# Patient Record
Sex: Female | Born: 1970 | Race: Black or African American | Hispanic: No | Marital: Single | Smoking: Never smoker
Health system: Southern US, Community
[De-identification: ages and names within clinical notes are randomized; demographics above are authoritative.]

## PROBLEM LIST (undated history)

## (undated) DIAGNOSIS — R0600 Dyspnea, unspecified: Secondary | ICD-10-CM

## (undated) DIAGNOSIS — K5900 Constipation, unspecified: Secondary | ICD-10-CM

## (undated) DIAGNOSIS — I10 Essential (primary) hypertension: Secondary | ICD-10-CM

## (undated) DIAGNOSIS — F419 Anxiety disorder, unspecified: Secondary | ICD-10-CM

## (undated) DIAGNOSIS — F329 Major depressive disorder, single episode, unspecified: Secondary | ICD-10-CM

## (undated) DIAGNOSIS — N939 Abnormal uterine and vaginal bleeding, unspecified: Secondary | ICD-10-CM

## (undated) DIAGNOSIS — E039 Hypothyroidism, unspecified: Secondary | ICD-10-CM

## (undated) DIAGNOSIS — F32A Depression, unspecified: Secondary | ICD-10-CM

## (undated) DIAGNOSIS — E739 Lactose intolerance, unspecified: Secondary | ICD-10-CM

## (undated) DIAGNOSIS — R109 Unspecified abdominal pain: Secondary | ICD-10-CM

## (undated) DIAGNOSIS — K219 Gastro-esophageal reflux disease without esophagitis: Secondary | ICD-10-CM

## (undated) DIAGNOSIS — M549 Dorsalgia, unspecified: Secondary | ICD-10-CM

## (undated) HISTORY — DX: Gastro-esophageal reflux disease without esophagitis: K21.9

## (undated) HISTORY — DX: Hypothyroidism, unspecified: E03.9

## (undated) HISTORY — DX: Unspecified abdominal pain: R10.9

## (undated) HISTORY — DX: Depression, unspecified: F32.A

## (undated) HISTORY — PX: CERCLAGE LAPAROSCOPIC ABDOMINAL: SHX5769

## (undated) HISTORY — DX: Dyspnea, unspecified: R06.00

## (undated) HISTORY — DX: Anxiety disorder, unspecified: F41.9

## (undated) HISTORY — DX: Lactose intolerance, unspecified: E73.9

## (undated) HISTORY — DX: Abnormal uterine and vaginal bleeding, unspecified: N93.9

## (undated) HISTORY — DX: Dorsalgia, unspecified: M54.9

## (undated) HISTORY — DX: Major depressive disorder, single episode, unspecified: F32.9

## (undated) HISTORY — DX: Essential (primary) hypertension: I10

## (undated) HISTORY — DX: Constipation, unspecified: K59.00

---

## 2002-03-26 ENCOUNTER — Ambulatory Visit (HOSPITAL_COMMUNITY): Admission: RE | Admit: 2002-03-26 | Discharge: 2002-03-26 | Payer: Self-pay | Admitting: Obstetrics & Gynecology

## 2002-03-26 ENCOUNTER — Encounter: Payer: Self-pay | Admitting: Obstetrics & Gynecology

## 2002-04-04 ENCOUNTER — Ambulatory Visit (HOSPITAL_COMMUNITY): Admission: RE | Admit: 2002-04-04 | Discharge: 2002-04-04 | Payer: Self-pay | Admitting: Obstetrics & Gynecology

## 2002-06-06 ENCOUNTER — Encounter: Admission: RE | Admit: 2002-06-06 | Discharge: 2002-06-14 | Payer: Self-pay | Admitting: Obstetrics & Gynecology

## 2002-06-20 ENCOUNTER — Inpatient Hospital Stay (HOSPITAL_COMMUNITY): Admission: AD | Admit: 2002-06-20 | Discharge: 2002-06-22 | Payer: Self-pay | Admitting: Obstetrics & Gynecology

## 2002-06-20 ENCOUNTER — Encounter: Payer: Self-pay | Admitting: Obstetrics & Gynecology

## 2002-08-09 ENCOUNTER — Inpatient Hospital Stay (HOSPITAL_COMMUNITY): Admission: AD | Admit: 2002-08-09 | Discharge: 2002-08-09 | Payer: Self-pay | Admitting: Obstetrics & Gynecology

## 2002-08-31 ENCOUNTER — Inpatient Hospital Stay (HOSPITAL_COMMUNITY): Admission: AD | Admit: 2002-08-31 | Discharge: 2002-09-02 | Payer: Self-pay | Admitting: Obstetrics

## 2004-02-06 ENCOUNTER — Inpatient Hospital Stay (HOSPITAL_COMMUNITY): Admission: AD | Admit: 2004-02-06 | Discharge: 2004-02-06 | Payer: Self-pay | Admitting: Obstetrics

## 2004-02-16 ENCOUNTER — Inpatient Hospital Stay (HOSPITAL_COMMUNITY): Admission: AD | Admit: 2004-02-16 | Discharge: 2004-02-21 | Payer: Self-pay | Admitting: Obstetrics & Gynecology

## 2005-08-26 ENCOUNTER — Encounter: Admission: RE | Admit: 2005-08-26 | Discharge: 2005-08-26 | Payer: Self-pay | Admitting: *Deleted

## 2010-02-07 ENCOUNTER — Encounter: Payer: Self-pay | Admitting: Obstetrics and Gynecology

## 2010-11-01 ENCOUNTER — Other Ambulatory Visit: Payer: Self-pay | Admitting: Certified Nurse Midwife

## 2010-11-01 DIAGNOSIS — Z1231 Encounter for screening mammogram for malignant neoplasm of breast: Secondary | ICD-10-CM

## 2010-12-06 ENCOUNTER — Ambulatory Visit: Payer: Self-pay

## 2011-01-13 ENCOUNTER — Ambulatory Visit
Admission: RE | Admit: 2011-01-13 | Discharge: 2011-01-13 | Disposition: A | Discharge status: Home / Self Care | Payer: BC Managed Care – PPO | Source: Ambulatory Visit | Attending: Certified Nurse Midwife | Admitting: Certified Nurse Midwife

## 2011-01-13 DIAGNOSIS — Z1231 Encounter for screening mammogram for malignant neoplasm of breast: Secondary | ICD-10-CM

## 2011-01-21 ENCOUNTER — Other Ambulatory Visit: Payer: Self-pay | Admitting: Certified Nurse Midwife

## 2011-01-21 DIAGNOSIS — R928 Other abnormal and inconclusive findings on diagnostic imaging of breast: Secondary | ICD-10-CM

## 2011-02-11 ENCOUNTER — Other Ambulatory Visit: Payer: BC Managed Care – PPO

## 2011-02-16 ENCOUNTER — Ambulatory Visit
Admission: RE | Admit: 2011-02-16 | Discharge: 2011-02-16 | Disposition: A | Discharge status: Home / Self Care | Payer: BC Managed Care – PPO | Source: Ambulatory Visit | Attending: Certified Nurse Midwife | Admitting: Certified Nurse Midwife

## 2011-02-16 DIAGNOSIS — R928 Other abnormal and inconclusive findings on diagnostic imaging of breast: Secondary | ICD-10-CM

## 2013-02-04 ENCOUNTER — Other Ambulatory Visit: Payer: Self-pay

## 2013-02-04 DIAGNOSIS — Z1231 Encounter for screening mammogram for malignant neoplasm of breast: Secondary | ICD-10-CM

## 2013-02-27 ENCOUNTER — Other Ambulatory Visit: Payer: Self-pay | Admitting: Internal Medicine

## 2013-02-27 DIAGNOSIS — E049 Nontoxic goiter, unspecified: Secondary | ICD-10-CM

## 2013-03-01 ENCOUNTER — Other Ambulatory Visit: Payer: Self-pay | Admitting: Internal Medicine

## 2013-03-01 ENCOUNTER — Ambulatory Visit
Admission: RE | Admit: 2013-03-01 | Discharge: 2013-03-01 | Disposition: A | Discharge status: Home / Self Care | Payer: BC Managed Care – PPO | Source: Ambulatory Visit | Attending: Internal Medicine | Admitting: Internal Medicine

## 2013-03-01 DIAGNOSIS — M79673 Pain in unspecified foot: Secondary | ICD-10-CM

## 2013-03-01 DIAGNOSIS — E049 Nontoxic goiter, unspecified: Secondary | ICD-10-CM

## 2013-03-11 ENCOUNTER — Ambulatory Visit: Payer: BC Managed Care – PPO

## 2013-03-25 ENCOUNTER — Ambulatory Visit
Admission: RE | Admit: 2013-03-25 | Discharge: 2013-03-25 | Disposition: A | Discharge status: Home / Self Care | Payer: BC Managed Care – PPO | Source: Ambulatory Visit

## 2013-03-25 DIAGNOSIS — Z1231 Encounter for screening mammogram for malignant neoplasm of breast: Secondary | ICD-10-CM

## 2014-04-03 ENCOUNTER — Other Ambulatory Visit: Payer: Self-pay | Admitting: Certified Nurse Midwife

## 2014-04-03 DIAGNOSIS — R928 Other abnormal and inconclusive findings on diagnostic imaging of breast: Secondary | ICD-10-CM

## 2014-04-08 ENCOUNTER — Ambulatory Visit
Admission: RE | Admit: 2014-04-08 | Discharge: 2014-04-08 | Disposition: A | Discharge status: Home / Self Care | Payer: BC Managed Care – PPO | Source: Ambulatory Visit | Attending: Certified Nurse Midwife | Admitting: Certified Nurse Midwife

## 2014-04-08 DIAGNOSIS — R928 Other abnormal and inconclusive findings on diagnostic imaging of breast: Secondary | ICD-10-CM

## 2014-07-07 IMAGING — US US SOFT TISSUE HEAD/NECK
1 series · 14 of 25 positions shown · non-contrast
Comparison: None.

CLINICAL DATA: Thyroid goiter.  History of hypothyroidism.

EXAM:
THYROID ULTRASOUND
TECHNIQUE: Ultrasound examination of the thyroid gland and adjacent soft
tissues was performed.

[Series 1: us soft tissue head/neck · 0.09mm/px · 14 of 40 slices shown]
[im 1/40]
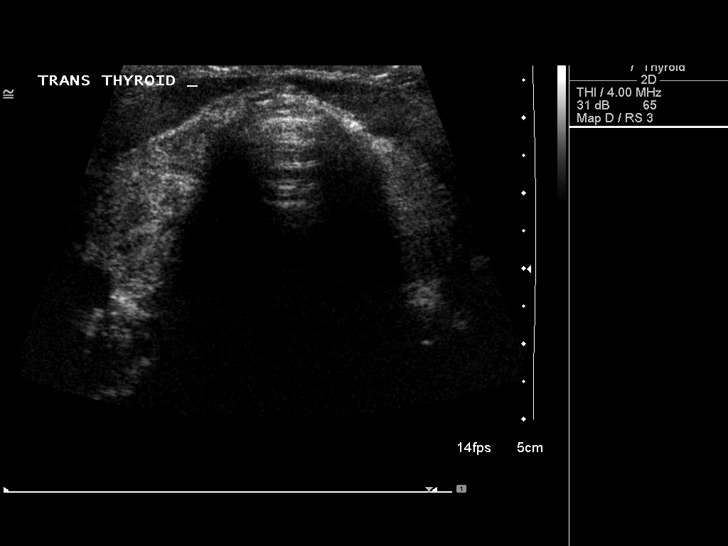
[im 4/40]
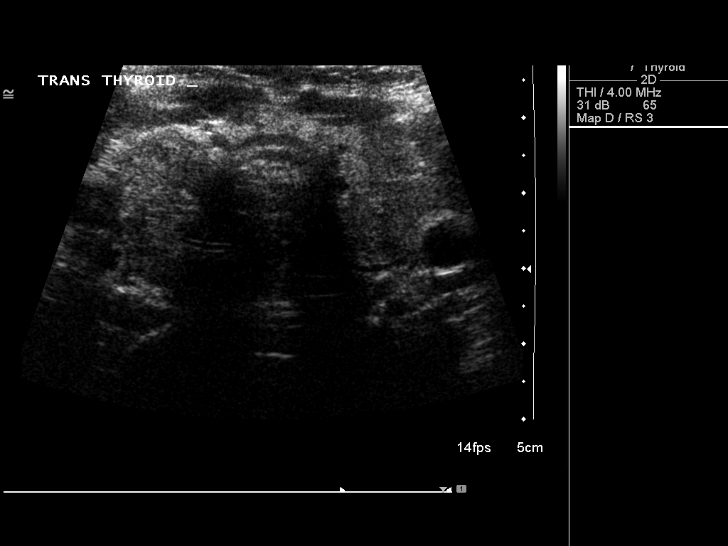
[im 7/40]
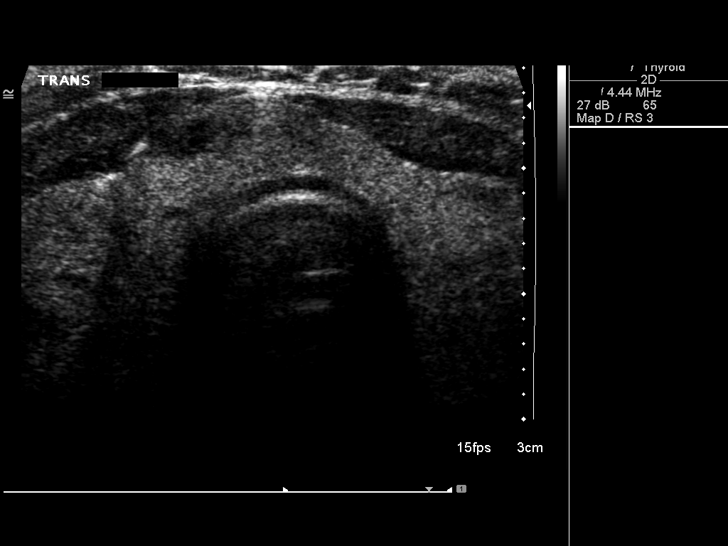
[im 10/40]
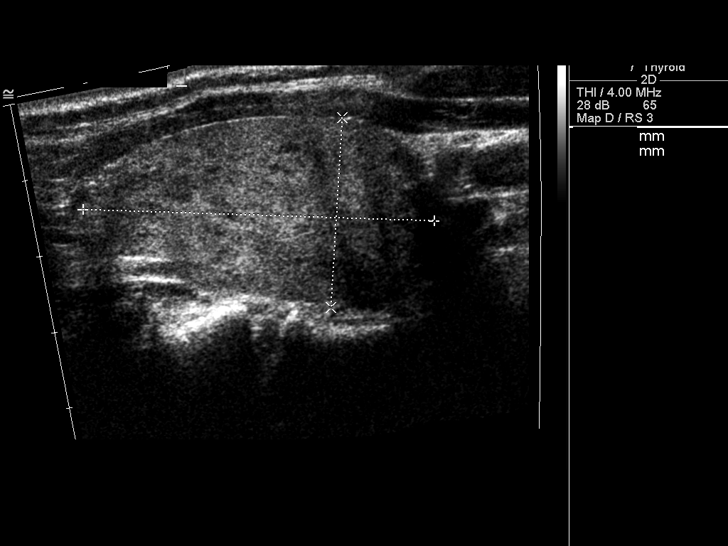
[im 14/40]
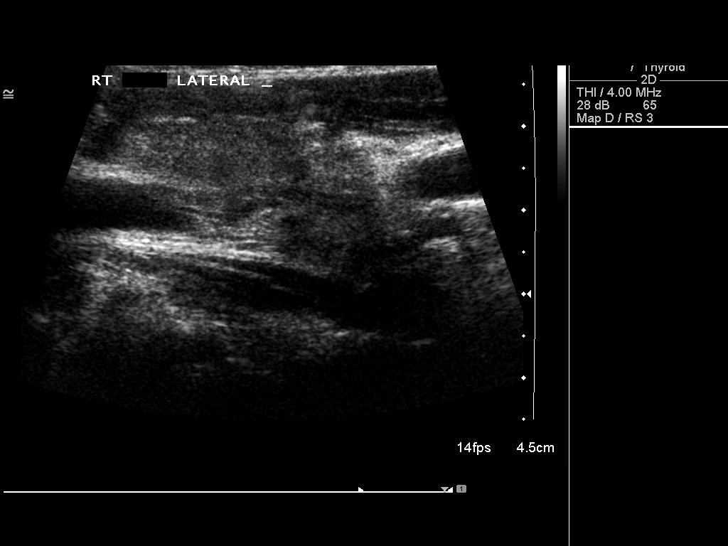
[im 15/40]
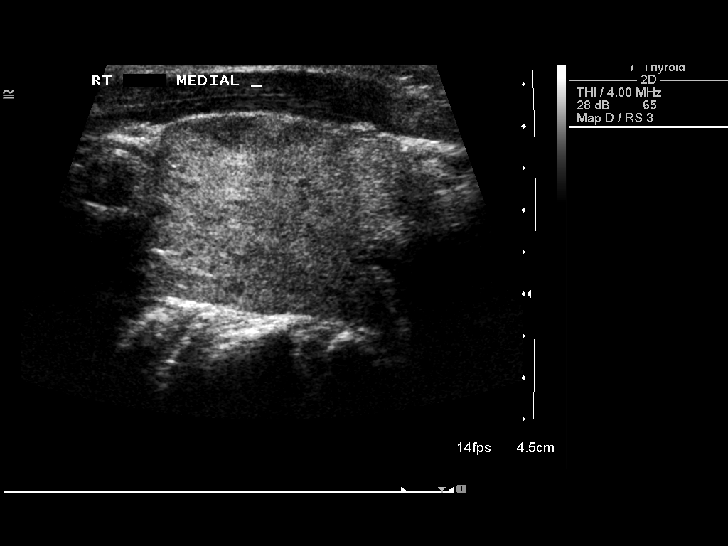
[im 18/40]
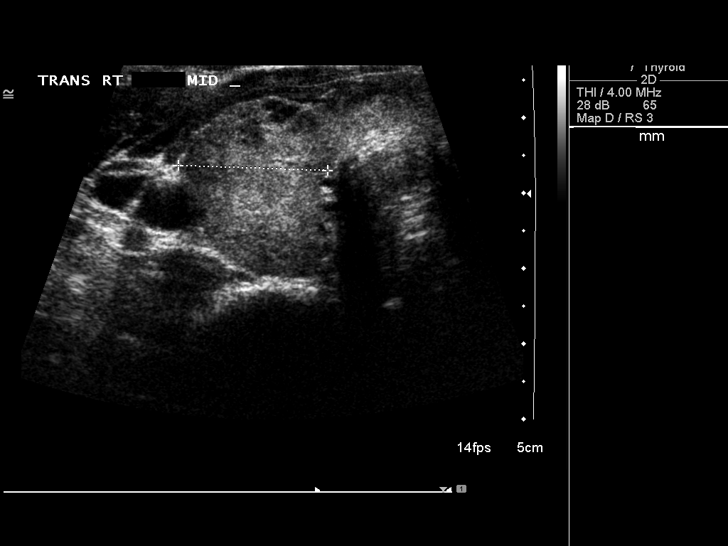
[im 22/40]
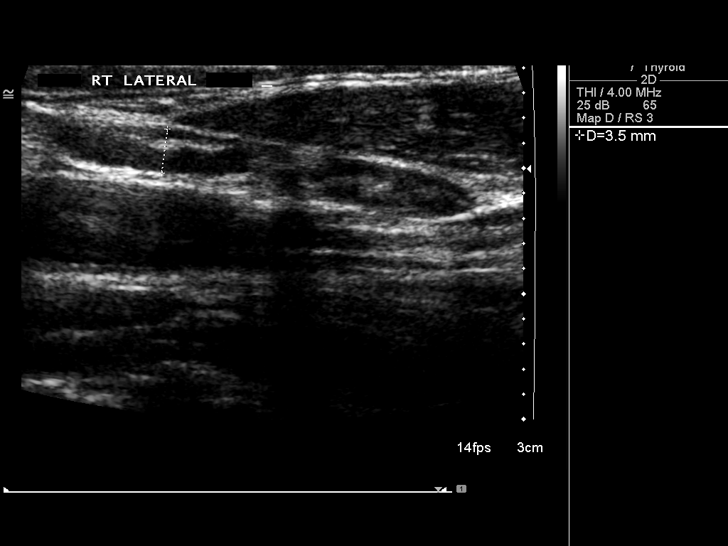
[im 25/40]
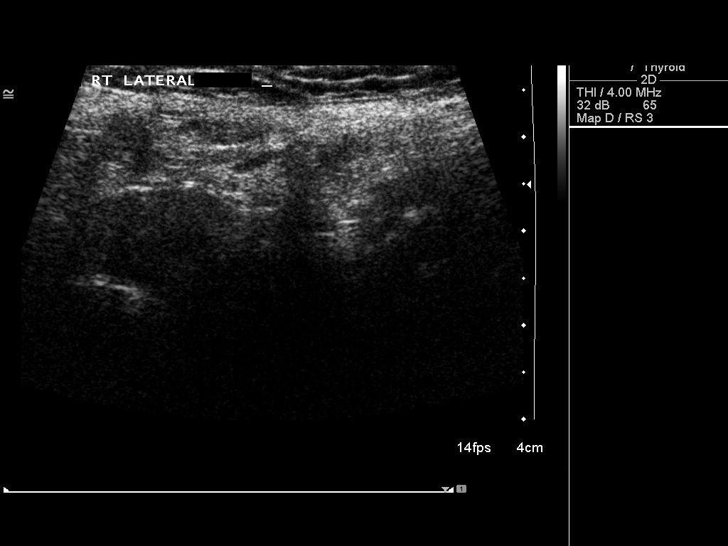
[im 27/40]
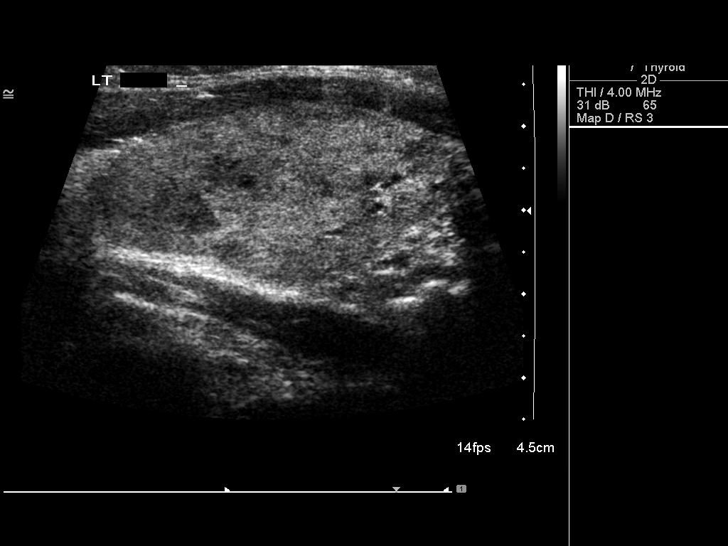
[im 30/40]
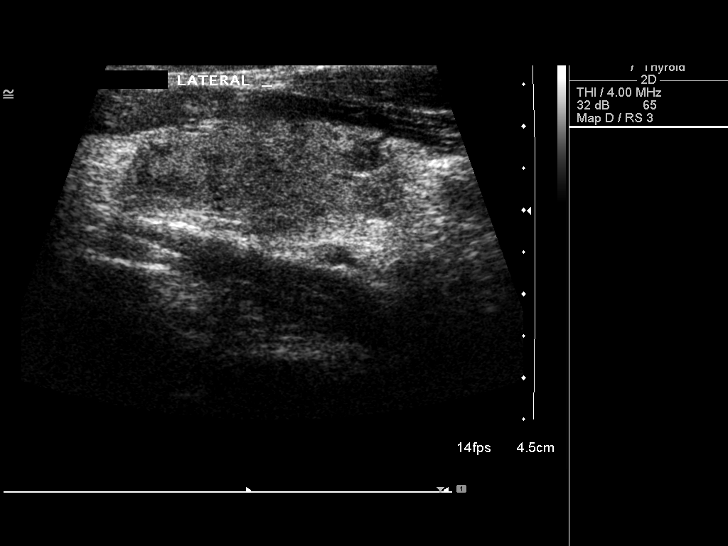
[im 33/40]
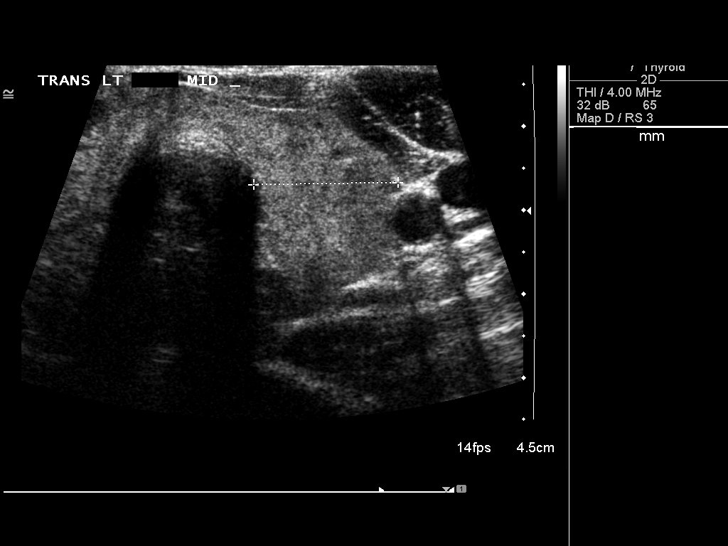
[im 36/40]
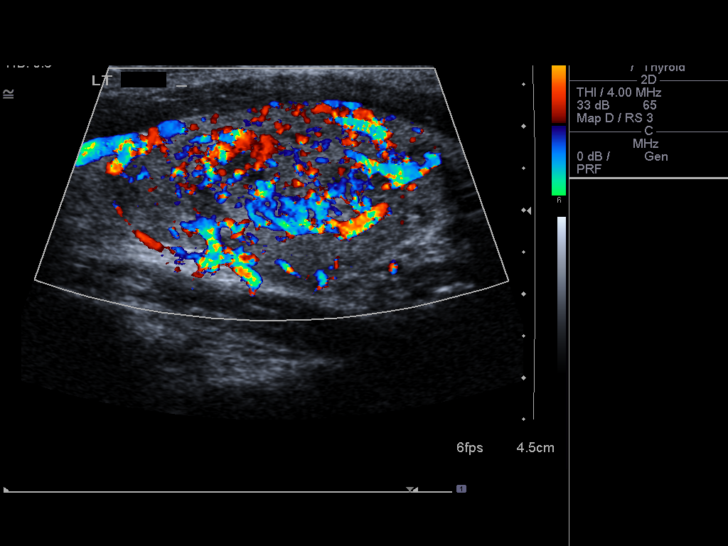
[im 40/40]
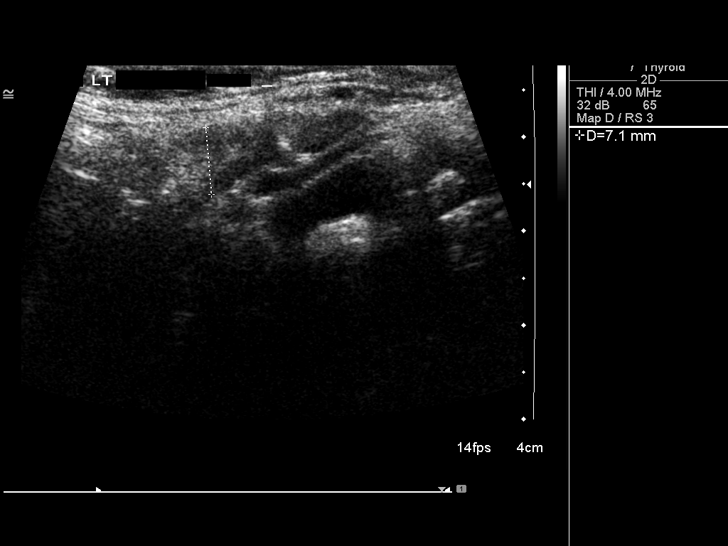

[14 of 25 positions shown; findings below may reference images not displayed]

FINDINGS: Right thyroid lobe

Measurements: 4.6 x 2.5 x 2.0 cm.  No nodules visualized.

Left thyroid lobe

Measurements: 4.4 x 2.2 x 1.7 cm.  No nodules visualized.

Isthmus

Thickness: 0.6 cm.  No nodules visualized.

The thyroid gland is heterogeneous in echotexture. No discrete
nodules are identified. Color Doppler shows diffusely increased
vascularity throughout the thyroid parenchyma which can be
associated with thyroiditis.

Lymphadenopathy

None visualized.
IMPRESSION: Normal sized thyroid gland which is heterogeneous and shows
increased vascularity. Increased vascularity can be associated with
active thyroiditis. No thyroid nodules are identified.

## 2014-07-07 IMAGING — CR DG OS CALCIS 2+V*L*
1 series · 1 of 1 positions shown · non-contrast
Comparison: None.

CLINICAL DATA: Heel pain.  No injury.

EXAM:
LEFT OS CALCIS - 2+ VIEW

[view not recorded]
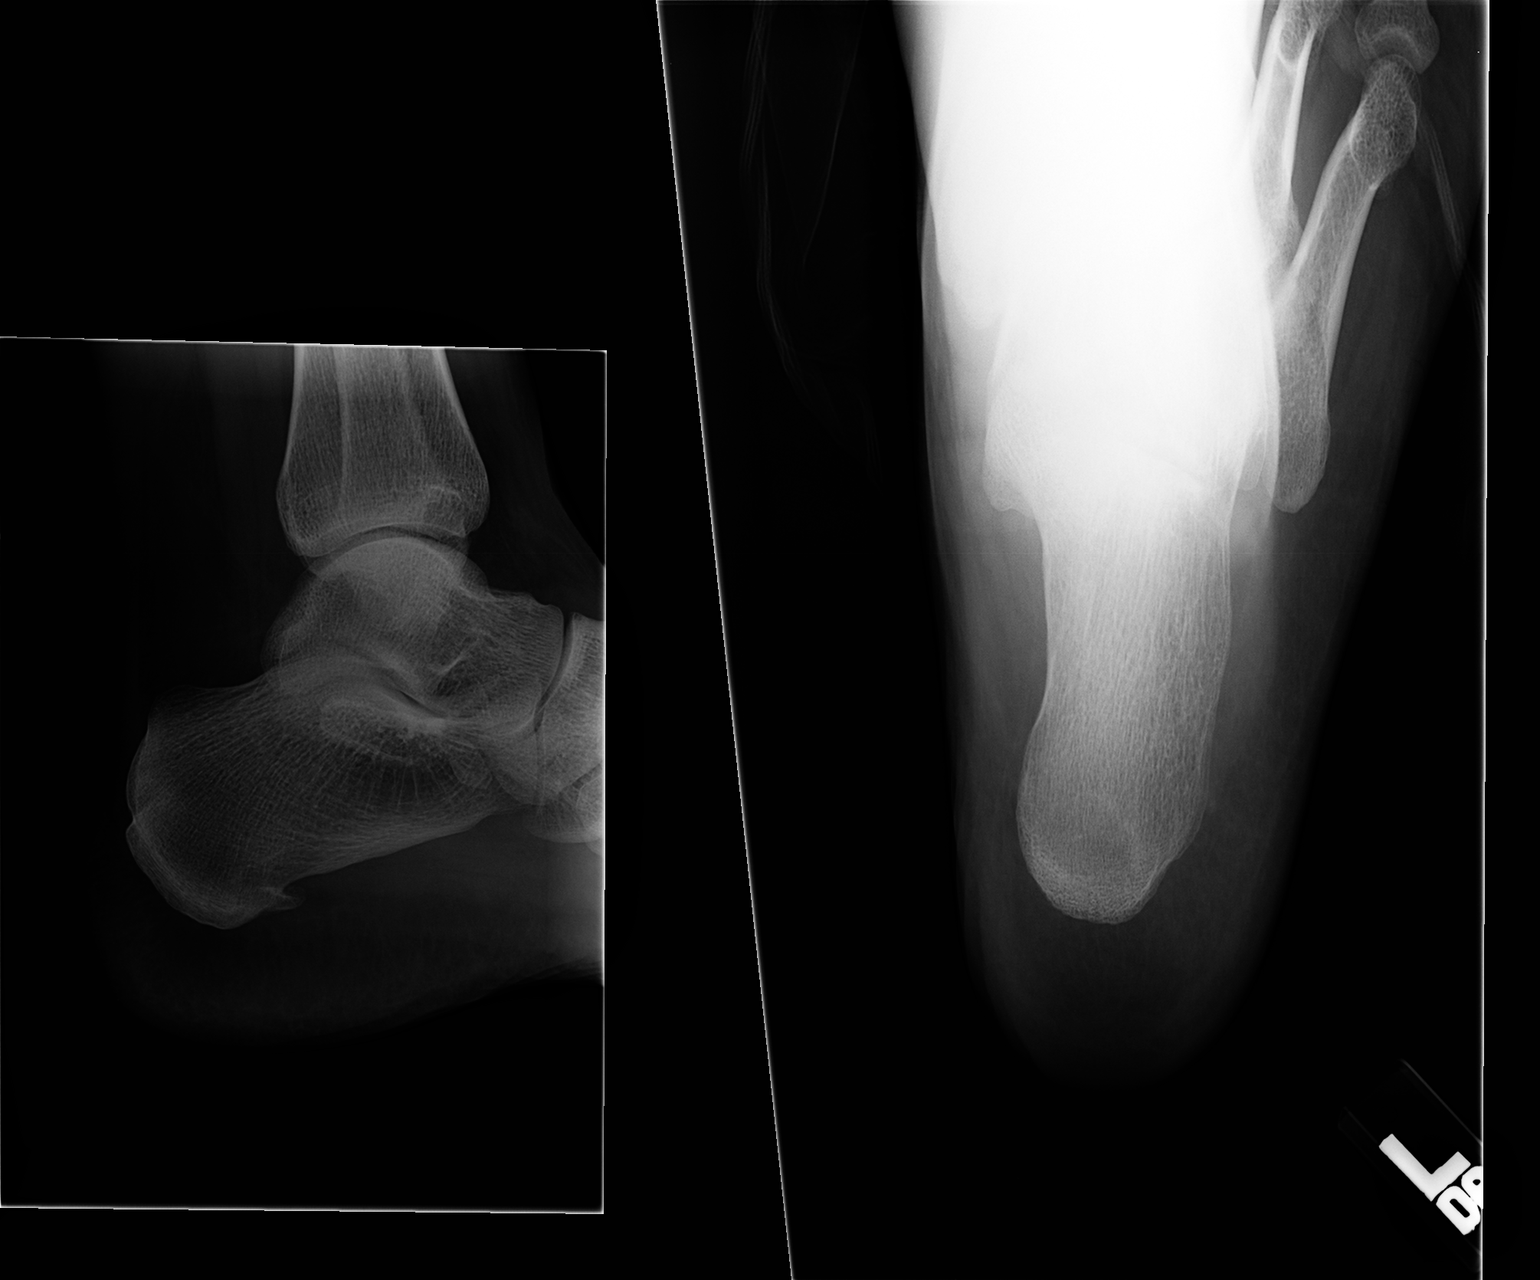

[1 of 1 positions shown; findings below may reference images not displayed]

FINDINGS: There is a small plantar calcaneal spur. No acute bony abnormality.
Specifically, no fracture, subluxation, or dislocation. Soft tissues
are intact.
IMPRESSION: Small plantar calcaneal spur.  No acute bony abnormality.

## 2015-04-24 ENCOUNTER — Other Ambulatory Visit: Payer: Self-pay

## 2015-04-24 DIAGNOSIS — Z1231 Encounter for screening mammogram for malignant neoplasm of breast: Secondary | ICD-10-CM

## 2015-04-28 ENCOUNTER — Ambulatory Visit
Admission: RE | Admit: 2015-04-28 | Discharge: 2015-04-28 | Disposition: A | Discharge status: Home / Self Care | Payer: BC Managed Care – PPO | Source: Ambulatory Visit

## 2015-04-28 DIAGNOSIS — Z1231 Encounter for screening mammogram for malignant neoplasm of breast: Secondary | ICD-10-CM

## 2016-11-09 ENCOUNTER — Other Ambulatory Visit: Payer: Self-pay | Admitting: Certified Nurse Midwife

## 2016-11-09 DIAGNOSIS — R928 Other abnormal and inconclusive findings on diagnostic imaging of breast: Secondary | ICD-10-CM

## 2016-11-14 ENCOUNTER — Ambulatory Visit
Admission: RE | Admit: 2016-11-14 | Discharge: 2016-11-14 | Disposition: A | Discharge status: Home / Self Care | Payer: BC Managed Care – PPO | Source: Ambulatory Visit | Attending: Certified Nurse Midwife | Admitting: Certified Nurse Midwife

## 2016-11-14 DIAGNOSIS — R928 Other abnormal and inconclusive findings on diagnostic imaging of breast: Secondary | ICD-10-CM

## 2018-02-15 ENCOUNTER — Encounter (INDEPENDENT_AMBULATORY_CARE_PROVIDER_SITE_OTHER): Payer: Self-pay

## 2018-02-20 ENCOUNTER — Encounter (INDEPENDENT_AMBULATORY_CARE_PROVIDER_SITE_OTHER): Payer: Self-pay

## 2018-03-07 ENCOUNTER — Encounter (INDEPENDENT_AMBULATORY_CARE_PROVIDER_SITE_OTHER): Payer: Self-pay | Admitting: Family Medicine

## 2018-03-07 ENCOUNTER — Ambulatory Visit (INDEPENDENT_AMBULATORY_CARE_PROVIDER_SITE_OTHER): Payer: BC Managed Care – PPO | Admitting: Family Medicine

## 2018-03-07 VITALS — BP 122/83 | HR 82 | Temp 98.4°F | Ht 68.0 in | Wt 220.0 lb

## 2018-03-07 DIAGNOSIS — E669 Obesity, unspecified: Secondary | ICD-10-CM

## 2018-03-07 DIAGNOSIS — Z6833 Body mass index (BMI) 33.0-33.9, adult: Secondary | ICD-10-CM

## 2018-03-07 DIAGNOSIS — Z0289 Encounter for other administrative examinations: Secondary | ICD-10-CM

## 2018-03-07 DIAGNOSIS — E038 Other specified hypothyroidism: Secondary | ICD-10-CM | POA: Diagnosis not present

## 2018-03-07 DIAGNOSIS — R5383 Other fatigue: Secondary | ICD-10-CM

## 2018-03-07 DIAGNOSIS — Z9189 Other specified personal risk factors, not elsewhere classified: Secondary | ICD-10-CM | POA: Diagnosis not present

## 2018-03-07 DIAGNOSIS — Z1331 Encounter for screening for depression: Secondary | ICD-10-CM

## 2018-03-07 DIAGNOSIS — I10 Essential (primary) hypertension: Secondary | ICD-10-CM | POA: Diagnosis not present

## 2018-03-07 DIAGNOSIS — R0602 Shortness of breath: Secondary | ICD-10-CM

## 2018-03-07 NOTE — Progress Notes (Signed)
Office: [phone]  /  Fax: [phone]   Dear Dr. Cherly Hensenousins,   Thank you for referring Maureen [name] to our clinic. The following note includes my evaluation and treatment recommendations.  HPI:   Chief Complaint: OBESITY    Maureen [name] has been referred by Maureen BetterSheronette Cousins, MD for consultation regarding her obesity and obesity related comorbidities.    Maureen [name] (MR# [id number]) is a 48 y.o. female who presents on 03/07/2018 for obesity evaluation and treatment. Current BMI is Body mass index is 33.45 kg/m.Maureen [name] has been struggling with her weight for many years and has been unsuccessful in either losing weight, maintaining weight loss, or reaching her healthy weight goal.     Maureen [name] attended our information session and states she is currently in the action stage of change and ready to dedicate time achieving and maintaining a healthier weight. Maureen [name] is interested in becoming our patient and working on intensive lifestyle modifications including (but not limited to) diet, exercise and weight loss.    Maureen [name] states she started gaining weight when her marriage went bad. her heaviest weight ever was 220 lbs. she has food cravings at night while watching TV she snacks frequently in the evenings she wakes up frequently in the middle of the night to eat she skips breakfast and sometimes lunch she is trying to eat vegetarian she is frequently drinking liquids with calories (ginger ale everyday) she frequently makes poor food choices she frequently eats larger portions than normal  she struggles with emotional eating    Fatigue Maureen [name] feels her energy is lower than it should be. This has worsened with weight gain and has worsened recently. Maureen [name] admits to daytime somnolence and  admits to waking up still tired. Patient is at risk for obstructive sleep apnea. Maureen [name] has a history of symptoms of daytime fatigue, morning fatigue and morning headache. Patient generally gets 4  hours of sleep per night, and states they generally do not sleep well most nights. Snoring is not present. Apneic episodes are not present. Epworth Sleepiness Score is 9.  Dyspnea on exertion Maureen [name] notes increasing shortness of breath with exercising and seems to be worsening over time with weight gain. She notes getting out of breath sooner with activity than she used to. This has gotten worse recently. Maureen [name] denies orthopnea.  Hypothyroid Maureen [name] has a diagnosis of hypothyroidism. She is on levothyroxine. She denies hot or cold intolerance or palpitations.  Hypertension Maureen [name] is a 48 y.o. female with hypertension.  Maureen [name] denies chest pain or shortness of breath on exertion. She is working weight loss to help control her blood pressure with the goal of decreasing her risk of heart attack and stroke. Maureen [name]'s blood pressure is stable on medications. Her goal is to get healthier so she can discontinue medications.   At risk for cardiovascular disease Maureen [name] is at a higher than average risk for cardiovascular disease due to obesity. She currently denies any chest pain.  Depression Screen Maureen [name] Food and Mood (modified PHQ-9) score was  Depression screen PHQ 2/9 03/07/2018  Decreased Interest 2  Down, Depressed, Hopeless 2  PHQ - 2 Score 4  Altered sleeping 3  Tired, decreased energy 3  Change in appetite 3  Feeling bad or failure about yourself  1  Trouble concentrating 1  Moving slowly or fidgety/restless 0  Suicidal thoughts 0  PHQ-9 Score 15  Difficult doing work/chores Somewhat difficult   ASSESSMENT AND PLAN:  Other fatigue -  Plan: EKG 12-Lead, Vitamin B12, CBC With Differential, Comprehensive metabolic panel, Folate, Hemoglobin A1c, Insulin, random, Lipid Panel With LDL/HDL Ratio, VITAMIN D 25 Hydroxy (Vit-D Deficiency, Fractures)  Shortness of breath on exertion  Other specified hypothyroidism - Plan: T3, T4, free, TSH  Essential  hypertension  Depression screening  At risk for heart disease  Class 1 obesity with serious comorbidity and body mass index (BMI) of 33.0 to 33.9 in adult, unspecified obesity type  PLAN:  Fatigue Maureen [name] was informed that her fatigue may be related to obesity, depression or many other causes. Labs will be ordered, and in the meanwhile Maureen [name] has agreed to work on diet, exercise and weight loss to help with fatigue. Proper sleep hygiene was discussed including the need for 7-8 hours of quality sleep each night. A sleep study was not ordered based on symptoms and Epworth score.  Dyspnea on exertion Maureen [name]'s shortness of breath appears to be obesity related and exercise induced. She has agreed to work on weight loss and gradually increase exercise to treat her exercise induced shortness of breath. If Maureen [name] follows our instructions and loses weight without improvement of her shortness of breath, we will plan to refer to pulmonology. We will monitor this condition regularly. Maureen [name] to this plan.  Hypothyroid Maureen [name] was informed of the importance of good thyroid control to help with weight loss efforts. She was also informed that supertherapeutic thyroid levels are dangerous and will not improve weight loss results. We will check labs today. She will continue on levothyroxine and [name] to follow-up with our clinic in 2 weeks.  Hypertension We discussed sodium restriction, working on healthy weight loss, and a regular exercise program as the means to achieve improved blood pressure control. Maureen [name] agreed with this plan and agreed to follow up as directed. We will continue to monitor her blood pressure as well as her progress with the above lifestyle modifications. She will continue her medications as prescribed and will watch for signs of hypotension as she continues her lifestyle modifications. We will check labs today.  Cardiovascular risk counseling Maureen [name] was given extended (15 minutes)  coronary artery disease prevention counseling today. She is 48 y.o. female and has risk factors for heart disease including obesity. We discussed intensive lifestyle modifications today with an emphasis on specific weight loss instructions and strategies. Pt was also informed of the importance of increasing exercise and decreasing saturated fats to help prevent heart disease.  Depression Screen Maureen [name] had a strongly positive depression screening. Depression is commonly associated with obesity and often results in emotional eating behaviors. We will monitor this closely and work on CBT to help improve the non-hunger eating patterns. Referral to Psychology may be required if no improvement is seen as she continues in our clinic.  Obesity Keigan is currently in the action stage of change and her goal is to continue with weight loss efforts. I recommend Eulalee begin the structured treatment plan as follows:  She has agreed to follow the Category 2 plan + 100 calories. Chonda dislikes yogurt and will eat 2 butterball [place] sausage patties. Sheenamarie has been instructed to eventually work up to a goal of 150 minutes of combined cardio and strengthening exercise per week for weight loss and overall health benefits. We discussed the following Behavioral Modification Strategies today: increasing lean protein intake, decreasing simple carbohydrates, and work on meal planning and easy cooking plans.   She was informed of the importance of frequent follow-up visits to maximize her success with  intensive lifestyle modifications for her multiple health conditions. She was informed we would discuss her lab results at her next visit unless there is a critical issue that needs to be addressed sooner. Brionne agreed to keep her next visit at the agreed upon time to discuss these results.  ALLERGIES: Allergies  Allergen Reactions  . Lactose Intolerance (Gi)   . Vicodin [Hydrocodone-Acetaminophen] Nausea And Vomiting     MEDICATIONS: Current Outpatient Medications on File Prior to Visit  Medication Sig Dispense Refill  . amLODipine (NORVASC) 2.5 MG tablet Take 2.5 mg by mouth daily.    . cetirizine (ZYRTEC) 10 MG tablet Take 10 mg by mouth daily.    . cyclobenzaprine (FLEXERIL) 10 MG tablet Take 10 mg by mouth 3 (three) times daily as needed for muscle spasms.    . fluticasone (FLONASE) 50 MCG/ACT nasal spray Place 2 sprays into both nostrils daily.    Marland Kitchen ibuprofen (ADVIL,MOTRIN) 200 MG tablet Take 200 mg by mouth every 6 (six) hours as needed.    Marland Kitchen levothyroxine (SYNTHROID, LEVOTHROID) 100 MCG tablet Take 100 mcg by mouth daily before breakfast.    . loratadine (CLARITIN) 10 MG tablet Take 10 mg by mouth daily.    Marland Kitchen LORazepam (ATIVAN) 0.5 MG tablet Take 0.5 mg by mouth at bedtime.    . naproxen sodium (ALEVE) 220 MG tablet Take 220 mg by mouth daily as needed.    . norethindrone-ethinyl estradiol (JUNEL FE,GILDESS FE,LOESTRIN FE) 1-20 MG-MCG tablet Take 1 tablet by mouth daily.     No current facility-administered medications on file prior to visit.     PAST MEDICAL HISTORY: Past Medical History:  Diagnosis Date  . Abdominal pain   . Anxiety   . Back pain   . Constipation   . Depression   . Dyspnea   . GERD (gastroesophageal reflux disease)   . HTN (hypertension)   . Hypothyroidism   . Lactose intolerance   . Vaginal bleeding, abnormal     PAST SURGICAL HISTORY: Past Surgical History:  Procedure Laterality Date  . CERCLAGE LAPAROSCOPIC ABDOMINAL     [phone]    SOCIAL HISTORY: Social History   Tobacco Use  . Smoking status: Never Smoker  . Smokeless tobacco: Never Used  Substance Use Topics  . Alcohol use: Not on file  . Drug use: Not on file    FAMILY HISTORY: Family History  Problem Relation Age of Onset  . Hypertension Mother   . Thyroid disease Mother   . Depression Mother   . Anxiety disorder Mother    ROS: Review of Systems  Constitutional: Positive for  malaise/fatigue.       Negative for hot or cold intolerance. Positive for trouble sleeping.  HENT:       Positive for nose stuffiness.  Eyes:       Positive for vision changes. Positive for wearing glasses or contacts.  Cardiovascular: Negative for palpitations and orthopnea.  Musculoskeletal: Positive for back pain.       Positive for leg cramping.  Skin:       Positive for dry skin.  Neurological: Positive for headaches.  Psychiatric/Behavioral:       Positive for stress.   PHYSICAL EXAM: Blood pressure 122/83, pulse 82, temperature 98.4 F (36.9 C), temperature source Oral, height 5\' 8"  (1.727 m), weight 220 lb (99.8 kg), SpO2 99 %. Body mass index is 33.45 kg/m. Physical Exam Vitals signs reviewed.  Constitutional:      Appearance: Normal appearance. She is  well-developed. She is obese.  HENT:     Head: Normocephalic and atraumatic.     Nose: Nose normal.  Eyes:     General: No scleral icterus. Neck:     Musculoskeletal: Normal range of motion.  Cardiovascular:     Rate and Rhythm: Normal rate and regular rhythm.  Pulmonary:     Effort: Pulmonary effort is normal. No respiratory distress.  Abdominal:     Palpations: Abdomen is soft.     Tenderness: There is no abdominal tenderness.  Musculoskeletal: Normal range of motion.     Comments: Range of motion normal in all four extremities.  Skin:    General: Skin is warm and dry.  Neurological:     Mental Status: She is alert and oriented to person, place, and time.     Coordination: Coordination normal.  Psychiatric:        Mood and Affect: Mood and affect normal.        Behavior: Behavior normal.   RECENT LABS AND TESTS: BMET No results found for: NA, K, CL, CO2, GLUCOSE, BUN, CREATININE, CALCIUM, GFRNONAA, GFRAA No results found for: HGBA1C No results found for: INSULIN CBC No results found for: WBC, RBC, HGB, HCT, PLT, MCV, MCH, MCHC, RDW, LYMPHSABS, MONOABS, EOSABS, BASOSABS Iron/TIBC/Ferritin/ %Sat No  results found for: IRON, TIBC, FERRITIN, IRONPCTSAT Lipid Panel  No results found for: CHOL, TRIG, HDL, CHOLHDL, VLDL, LDLCALC, LDLDIRECT Hepatic Function Panel  No results found for: PROT, ALBUMIN, AST, ALT, ALKPHOS, BILITOT, BILIDIR, IBILI No results found for: TSH  ECG  Shows a rate of 76, sinus rhythm - poor R-wave progression - nonspecific - consider old anterior infarct. INDIRECT CALORIMETER done today shows a VO2 of 241 and a REE of 1675.  Her calculated basal metabolic rate is 1610 thus her basal metabolic rate is worse than expected.  OBESITY BEHAVIORAL INTERVENTION VISIT  Today's visit was #1   Starting weight: 220 lbs Starting date: 03/07/2018 Today's weight: 220 lbs  Today's date: 03/07/2018 Total lbs lost to date: 0    Most Recent Value 03/14/2013 - 03/13/2018  Height 5\' 8"  (1.727 m) 03/07/2018  Weight 220 lb (99.8 kg) 03/07/2018  BMI (Calculated) 33.46 03/07/2018  BLOOD PRESSURE - SYSTOLIC 122 03/07/2018  BLOOD PRESSURE - DIASTOLIC 83 03/07/2018  Waist Measurement  44 inches 03/07/2018   Body Fat % 39.7 % 03/07/2018  Total Body Water (lbs) 82.6 lbs 03/07/2018  RMR 1675 03/07/2018   ASK: We discussed the diagnosis of obesity with Maureen [name] today and Mariaha agreed to give [name] permission to discuss obesity behavioral modification therapy today.  ASSESS: Lynnette has the diagnosis of obesity and her BMI today is 33.45. Chanele is in the action stage of change.   ADVISE: Wilmina was educated on the multiple health risks of obesity as well as the benefit of weight loss to improve her health. She was advised of the need for long term treatment and the importance of lifestyle modifications to improve her current health and to decrease her risk of future health problems.  AGREE: Multiple dietary modification options and treatment options were discussed and  Daaiyah agreed to follow the recommendations documented in the above note.  ARRANGE: Brightyn was educated on the  importance of frequent visits to treat obesity as outlined per CMS and USPSTF guidelines and agreed to schedule her next follow up appointment today.  IMarianna Payment, am acting as Energy manager for Quillian Quince, MD  I have reviewed the above documentation for  accuracy and completeness, and I agree with the above. -Dennard Nip, MD

## 2018-03-08 LAB — COMPREHENSIVE METABOLIC PANEL
ALBUMIN: 4.3 g/dL (ref 3.8–4.8)
ALT: 10 IU/L (ref 0–32)
AST: 16 IU/L (ref 0–40)
Albumin/Globulin Ratio: 1.7 (ref 1.2–2.2)
Alkaline Phosphatase: 69 IU/L (ref 39–117)
BUN / CREAT RATIO: 12 (ref 9–23)
BUN: 11 mg/dL (ref 6–24)
Bilirubin Total: 0.3 mg/dL (ref 0.0–1.2)
CALCIUM: 9.3 mg/dL (ref 8.7–10.2)
CHLORIDE: 102 mmol/L (ref 96–106)
CO2: 21 mmol/L (ref 20–29)
CREATININE: 0.91 mg/dL (ref 0.57–1.00)
GFR calc non Af Amer: 74 mL/min/{1.73_m2} (ref >59)
GFR, EST AFRICAN AMERICAN: 85 mL/min/{1.73_m2} (ref >59)
GLUCOSE: 81 mg/dL (ref 65–99)
Globulin, Total: 2.5 g/dL (ref 1.5–4.5)
Potassium: 4.6 mmol/L (ref 3.5–5.2)
Sodium: 138 mmol/L (ref 134–144)
TOTAL PROTEIN: 6.8 g/dL (ref 6.0–8.5)

## 2018-03-08 LAB — CBC WITH DIFFERENTIAL
BASOS ABS: 0.1 10*3/uL (ref 0.0–0.2)
Basos: 1 %
EOS (ABSOLUTE): 0.2 10*3/uL (ref 0.0–0.4)
Eos: 3 %
HEMOGLOBIN: 13.4 g/dL (ref 11.1–15.9)
Hematocrit: 41 % (ref 34.0–46.6)
IMMATURE GRANS (ABS): 0 10*3/uL (ref 0.0–0.1)
IMMATURE GRANULOCYTES: 0 %
LYMPHS: 26 %
Lymphocytes Absolute: 2.2 10*3/uL (ref 0.7–3.1)
MCH: 27.6 pg (ref 26.6–33.0)
MCHC: 32.7 g/dL (ref 31.5–35.7)
MCV: 84 fL (ref 79–97)
MONOCYTES: 5 %
Monocytes Absolute: 0.4 10*3/uL (ref 0.1–0.9)
NEUTROS PCT: 65 %
Neutrophils Absolute: 5.4 10*3/uL (ref 1.4–7.0)
RBC: 4.86 x10E6/uL (ref 3.77–5.28)
RDW: 13.2 % (ref 11.7–15.4)
WBC: 8.3 10*3/uL (ref 3.4–10.8)

## 2018-03-08 LAB — LIPID PANEL WITH LDL/HDL RATIO
CHOLESTEROL TOTAL: 197 mg/dL (ref 100–199)
HDL: 61 mg/dL (ref >39)
LDL Calculated: 117 mg/dL — ABNORMAL HIGH (ref 0–99)
LDl/HDL Ratio: 1.9 ratio (ref 0.0–3.2)
Triglycerides: 93 mg/dL (ref 0–149)
VLDL CHOLESTEROL CAL: 19 mg/dL (ref 5–40)

## 2018-03-08 LAB — HEMOGLOBIN A1C
ESTIMATED AVERAGE GLUCOSE: 105 mg/dL
Hgb A1c MFr Bld: 5.3 % (ref 4.8–5.6)

## 2018-03-08 LAB — INSULIN, RANDOM: INSULIN: 16.5 u[IU]/mL (ref 2.6–24.9)

## 2018-03-08 LAB — T4, FREE: FREE T4: 1.18 ng/dL (ref 0.82–1.77)

## 2018-03-08 LAB — TSH: TSH: 0.528 u[IU]/mL (ref 0.450–4.500)

## 2018-03-08 LAB — T3: T3 TOTAL: 151 ng/dL (ref 71–180)

## 2018-03-08 LAB — VITAMIN B12: VITAMIN B 12: 354 pg/mL (ref 232–1245)

## 2018-03-08 LAB — FOLATE: Folate: 7.9 ng/mL (ref >3.0)

## 2018-03-08 LAB — VITAMIN D 25 HYDROXY (VIT D DEFICIENCY, FRACTURES): Vit D, 25-Hydroxy: 12.6 ng/mL — ABNORMAL LOW (ref 30.0–100.0)

## 2018-03-21 ENCOUNTER — Encounter (INDEPENDENT_AMBULATORY_CARE_PROVIDER_SITE_OTHER): Payer: Self-pay | Admitting: Family Medicine

## 2018-03-21 ENCOUNTER — Ambulatory Visit (INDEPENDENT_AMBULATORY_CARE_PROVIDER_SITE_OTHER): Payer: BC Managed Care – PPO | Admitting: Family Medicine

## 2018-03-21 VITALS — BP 129/81 | HR 83 | Ht 68.0 in | Wt 220.0 lb

## 2018-03-21 DIAGNOSIS — E559 Vitamin D deficiency, unspecified: Secondary | ICD-10-CM

## 2018-03-21 DIAGNOSIS — E669 Obesity, unspecified: Secondary | ICD-10-CM

## 2018-03-21 DIAGNOSIS — E7849 Other hyperlipidemia: Secondary | ICD-10-CM | POA: Diagnosis not present

## 2018-03-21 DIAGNOSIS — E8881 Metabolic syndrome: Secondary | ICD-10-CM

## 2018-03-21 DIAGNOSIS — Z9189 Other specified personal risk factors, not elsewhere classified: Secondary | ICD-10-CM

## 2018-03-21 DIAGNOSIS — Z6833 Body mass index (BMI) 33.0-33.9, adult: Secondary | ICD-10-CM

## 2018-03-21 MED ORDER — METFORMIN HCL 500 MG PO TABS: 500.0000 mg | ORAL_TABLET | Freq: Every day | ORAL | 0 refills | Status: DC

## 2018-03-21 MED ORDER — VITAMIN D (ERGOCALCIFEROL) 1.25 MG (50000 UNIT) PO CAPS: 50000.0000 [IU] | ORAL_CAPSULE | ORAL | 0 refills | Status: DC

## 2018-03-21 NOTE — Progress Notes (Signed)
Office: [phone]  /  Fax: [phone]   HPI:   Chief Complaint: OBESITY Maureen [name] is here to discuss her progress with her obesity treatment plan. She is on the Category 2 plan and is following her eating plan approximately 80% of the time. She states she is walking 30 minutes 3 times per week. Maureen [name] states she struggled to follow her plan. She states she was able to eat most of her food but snacked on high sugar, high calorie foods at night. She felt this was mostly due to cravings but maybe due to increased hunger. Her weight is 220 lb (99.8 kg) today and has not lost weight since her last visit. She has lost 0 lbs since starting treatment with [name].  Hyperlipidemia (Pure) Maureen [name] has hyperlipidemia with an increased LDL and has been trying to improve her cholesterol levels with intensive lifestyle modification including a low saturated fat diet, exercise and weight loss. She does have normal HDL and triglycerides. She states she would like to try to diet control. She denies any chest pain.  Vitamin D deficiency Maureen [name] has a diagnosis of Vitamin D deficiency with a low Vitamin D level of 12.6 on 03/07/2018. She is not currently taking Vit D and currently reports positive fatigue.  Insulin Resistance Maureen [name] has a new diagnosis of insulin resistance based on her elevated fasting insulin level >5. Although Bonna's blood glucose readings are still under good control, insulin resistance puts her at greater risk of metabolic syndrome and diabetes. She notes that she has increased evening polyphagia and struggles to decrease sugar in her diet. She is not taking metformin currently and continues to work on diet and exercise to decrease risk of diabetes.  At risk for diabetes Maureen [name] is at higher than averagerisk for developing diabetes due to her obesity. She currently denies polyuria or polydipsia.  ASSESSMENT AND PLAN:  Other hyperlipidemia  Vitamin D deficiency - Plan: Vitamin D,  Ergocalciferol, (DRISDOL) 1.25 MG (50000 UT) CAPS capsule  Insulin resistance - Plan: metFORMIN (GLUCOPHAGE) 500 MG tablet  At risk for diabetes mellitus  Class 1 obesity with serious comorbidity and body mass index (BMI) of 33.0 to 33.9 in adult, unspecified obesity type  PLAN:  Hyperlipidemia Maureen [name] was informed of the American Heart Association Guidelines emphasizing intensive lifestyle modifications as the first line treatment for hyperlipidemia. We discussed many lifestyle modifications today in depth, and Tricha will continue to work on decreasing saturated fats such as fatty red meat, butter and many fried foods. She will continue her diet and exercise and we will recheck her labs in 3 months.  Vitamin D Deficiency Maureen [name] was informed that low Vitamin D levels contributes to fatigue and are associated with obesity, breast, and colon cancer. She agrees to begin taking prescription Vit D @ 50,000 IU every week #4 with 0 refills and will follow-up for routine testing of Vitamin D in 3 months. She was informed of the risk of over-replacement of Vitamin D and agrees to not increase her dose unless she discusses this with [name] first. Maureen [name] agrees to follow-up with our clinic in 2-3 weeks.  Insulin Resistance Kathyjo will continue to work on weight loss, exercise, and decreasing simple carbohydrates in her diet to help decrease the risk of diabetes. We dicussed metformin including benefits and risks. She was informed that eating too many simple carbohydrates or too many calories at one sitting increases the likelihood of GI side effects. Maureen [name] will start Metformin 500 mg qam #30 with 0 refills.  She will continue her diet and exercise and will have her labs rechecked in 3 months. Maureen [name] agreed to follow-up with [name] as directed to monitor her progress.  Diabetes risk counseling Maureen [name] was given extended (30 minutes) diabetes prevention counseling today. She is 48 y.o. female and has risk factors for  diabetes including obesity. We discussed intensive lifestyle modifications today with an emphasis on weight loss as well as increasing exercise and decreasing simple carbohydrates in her diet.  Obesity Maureen [name] is currently in the action stage of change. As such, her goal is to continue with weight loss efforts. She has agreed to follow the Category 2 plan. Maureen [name] has been instructed to work up to a goal of 150 minutes of combined cardio and strengthening exercise per week for weight loss and overall health benefits. We discussed the following Behavioral Modification Strategies today: increasing lean protein intake, decreasing simple carbohydrates, and work on meal planning and easy cooking plans.  Crystral has agreed to follow-up with our clinic in 2-3 weeks. She was informed of the importance of frequent follow up visits to maximize her success with intensive lifestyle modifications for her multiple health conditions.  ALLERGIES: Allergies  Allergen Reactions  . Lactose Intolerance (Gi)   . Vicodin [Hydrocodone-Acetaminophen] Nausea And Vomiting    MEDICATIONS: Current Outpatient Medications on File Prior to Visit  Medication Sig Dispense Refill  . amLODipine (NORVASC) 2.5 MG tablet Take 2.5 mg by mouth daily.    . cetirizine (ZYRTEC) 10 MG tablet Take 10 mg by mouth daily.    . cyclobenzaprine (FLEXERIL) 10 MG tablet Take 10 mg by mouth 3 (three) times daily as needed for muscle spasms.    . fluticasone (FLONASE) 50 MCG/ACT nasal spray Place 2 sprays into both nostrils daily.    Marland Kitchen ibuprofen (ADVIL,MOTRIN) 200 MG tablet Take 200 mg by mouth every 6 (six) hours as needed.    Marland Kitchen levothyroxine (SYNTHROID, LEVOTHROID) 100 MCG tablet Take 100 mcg by mouth daily before breakfast.    . loratadine (CLARITIN) 10 MG tablet Take 10 mg by mouth daily.    Marland Kitchen LORazepam (ATIVAN) 0.5 MG tablet Take 0.5 mg by mouth at bedtime.    . naproxen sodium (ALEVE) 220 MG tablet Take 220 mg by mouth daily as needed.     . norethindrone-ethinyl estradiol (JUNEL FE,GILDESS FE,LOESTRIN FE) 1-20 MG-MCG tablet Take 1 tablet by mouth daily.     No current facility-administered medications on file prior to visit.     PAST MEDICAL HISTORY: Past Medical History:  Diagnosis Date  . Abdominal pain   . Anxiety   . Back pain   . Constipation   . Depression   . Dyspnea   . GERD (gastroesophageal reflux disease)   . HTN (hypertension)   . Hypothyroidism   . Lactose intolerance   . Vaginal bleeding, abnormal     PAST SURGICAL HISTORY: Past Surgical History:  Procedure Laterality Date  . CERCLAGE LAPAROSCOPIC ABDOMINAL     [phone]    SOCIAL HISTORY: Social History   Tobacco Use  . Smoking status: Never Smoker  . Smokeless tobacco: Never Used  Substance Use Topics  . Alcohol use: Not on file  . Drug use: Not on file    FAMILY HISTORY: Family History  Problem Relation Age of Onset  . Hypertension Mother   . Thyroid disease Mother   . Depression Mother   . Anxiety disorder Mother    ROS: Review of Systems  Constitutional: Positive for malaise/fatigue. Negative  for weight loss.   PHYSICAL EXAM: Blood pressure 129/81, pulse 83, height  (1.727 m), weight 220 lb (99.8 kg), SpO2 99 %. Body mass index is 33.45 kg/m. Physical Exam Vitals signs reviewed.  Constitutional:      Appearance: Normal appearance. She is obese.  Cardiovascular:     Rate and Rhythm: Normal rate.     Pulses: Normal pulses.  Pulmonary:     Effort: Pulmonary effort is normal.     Breath sounds: Normal breath sounds.  Musculoskeletal: Normal range of motion.  Skin:    General: Skin is warm and dry.  Neurological:     Mental Status: She is alert and oriented to person, place, and time.  Psychiatric:        Behavior: Behavior normal.   RECENT LABS AND TESTS: BMET    Component Value Date/Time   NA 138 03/07/2018 1032   K 4.6 03/07/2018 1032   CL 102 03/07/2018 1032   CO2 21 03/07/2018 1032    GLUCOSE 81 03/07/2018 1032   BUN 11 03/07/2018 1032   CREATININE 0.91 03/07/2018 1032   CALCIUM 9.3 03/07/2018 1032   GFRNONAA 74 03/07/2018 1032   GFRAA 85 03/07/2018 1032   Lab Results  Component Value Date   HGBA1C 5.3 03/07/2018   Lab Results  Component Value Date   INSULIN 16.5 03/07/2018   CBC    Component Value Date/Time   WBC 8.3 03/07/2018 1032   RBC 4.86 03/07/2018 1032   HGB 13.4 03/07/2018 1032   HCT 41.0 03/07/2018 1032   MCV 84 03/07/2018 1032   MCH 27.6 03/07/2018 1032   MCHC 32.7 03/07/2018 1032   RDW 13.2 03/07/2018 1032   LYMPHSABS 2.2 03/07/2018 1032   EOSABS 0.2 03/07/2018 1032   BASOSABS 0.1 03/07/2018 1032   Iron/TIBC/Ferritin/ %Sat No results found for: IRON, TIBC, FERRITIN, IRONPCTSAT Lipid Panel     Component Value Date/Time   CHOL 197 03/07/2018 1032   TRIG 93 03/07/2018 1032   HDL 61 03/07/2018 1032   LDLCALC 117 (H) 03/07/2018 1032   Hepatic Function Panel     Component Value Date/Time   PROT 6.8 03/07/2018 1032   ALBUMIN 4.3 03/07/2018 1032   AST 16 03/07/2018 1032   ALT 10 03/07/2018 1032   ALKPHOS 69 03/07/2018 1032   BILITOT 0.3 03/07/2018 1032      Component Value Date/Time   TSH 0.528 03/07/2018 1032    Ref. Range 03/07/2018 10:32  Vitamin D, 25-Hydroxy Latest Ref Range: 30.0 - 100.0 ng/mL 12.6 (L)   OBESITY BEHAVIORAL INTERVENTION VISIT  Today's visit was #2  Starting weight: 220 lbs Starting date: 03/07/2018 Today's weight: 220 lbs  Today's date: 03/21/2018 Total lbs lost to date: 0    03/21/2018  Height  (1.727 m)  Weight 220 lb (99.8 kg)  BMI (Calculated) 33.46  BLOOD PRESSURE - SYSTOLIC 129  BLOOD PRESSURE - DIASTOLIC 81   Body Fat % 39.3 %  Total Body Water (lbs) 82.8 lbs   ASK: We discussed the diagnosis of obesity with Maureen [name] today and Maureen [name] agreed to give [name] permission to discuss obesity behavioral modification therapy today.  ASSESS: Abagale has the diagnosis of obesity and her BMI  today is 33.46. Minh is in the action stage of change.   ADVISE: Maureen [name] was educated on the multiple health risks of obesity as well as the benefit of weight loss to improve her health. She was advised of the need for long  term treatment and the importance of lifestyle modifications to improve her current health and to decrease her risk of future health problems.  AGREE: Multiple dietary modification options and treatment options were discussed and  Maureen [name] agreed to follow the recommendations documented in the above note.  ARRANGE: Maureen [name] was educated on the importance of frequent visits to treat obesity as outlined per CMS and USPSTF guidelines and agreed to schedule her next follow up appointment today.  I, Marianna Payment, am acting as Energy manager for Quillian Quince, MD  I have reviewed the above documentation for accuracy and completeness, and I agree with the above. -Quillian Quince, MD

## 2018-04-09 ENCOUNTER — Other Ambulatory Visit: Payer: Self-pay

## 2018-04-09 ENCOUNTER — Other Ambulatory Visit (INDEPENDENT_AMBULATORY_CARE_PROVIDER_SITE_OTHER): Payer: Self-pay | Admitting: Family Medicine

## 2018-04-09 ENCOUNTER — Encounter (INDEPENDENT_AMBULATORY_CARE_PROVIDER_SITE_OTHER): Payer: Self-pay | Admitting: Family Medicine

## 2018-04-09 ENCOUNTER — Ambulatory Visit (INDEPENDENT_AMBULATORY_CARE_PROVIDER_SITE_OTHER): Payer: BC Managed Care – PPO | Admitting: Family Medicine

## 2018-04-09 VITALS — BP 139/86 | HR 80 | Ht 68.0 in | Wt 216.0 lb

## 2018-04-09 DIAGNOSIS — E669 Obesity, unspecified: Secondary | ICD-10-CM

## 2018-04-09 DIAGNOSIS — E8881 Metabolic syndrome: Secondary | ICD-10-CM

## 2018-04-09 DIAGNOSIS — E559 Vitamin D deficiency, unspecified: Secondary | ICD-10-CM | POA: Diagnosis not present

## 2018-04-09 DIAGNOSIS — Z9189 Other specified personal risk factors, not elsewhere classified: Secondary | ICD-10-CM

## 2018-04-09 DIAGNOSIS — Z6832 Body mass index (BMI) 32.0-32.9, adult: Secondary | ICD-10-CM

## 2018-04-09 MED ORDER — METFORMIN HCL 500 MG PO TABS: 500.0000 mg | ORAL_TABLET | Freq: Every day | ORAL | 0 refills | Status: DC

## 2018-04-09 MED ORDER — VITAMIN D (ERGOCALCIFEROL) 1.25 MG (50000 UNIT) PO CAPS: 50000.0000 [IU] | ORAL_CAPSULE | ORAL | 0 refills | Status: DC

## 2018-04-09 NOTE — Progress Notes (Signed)
Office: [phone]  /  Fax: [phone]   HPI:   Chief Complaint: OBESITY Maureen [name] is here to discuss her progress with her obesity treatment plan. She is on the Category 2 plan and is following her eating plan approximately 50 % of the time. She states she is walking and riding a bike 30 minutes 6 times per week. Maureen [name] has done well with weight loss on her Category 2 plan. She is exercising daily now that her school has closed for COVID19. She is doing well with her Category 2 plan, but struggles with night time snacking.  Her weight is 216 lb (98 kg) today and has had a weight loss of 4 pounds over a period of 3 weeks since her last visit. She has lost 4 lbs since starting treatment with [name].  Insulin Resistance Maureen [name] has a diagnosis of insulin resistance based on her elevated fasting insulin level >5. Although Maureen [name]'s blood glucose readings are still under good control, insulin resistance puts her at greater risk of metabolic syndrome and diabetes. She was started on metformin and her hunger is controlled. She denies nausea except after eating chips last night.  Vitamin D Deficiency Maureen [name] has a diagnosis of vitamin D deficiency. She is currently stable on vit D. Marsena denies nausea, vomiting, or muscle weakness.  At risk for osteopenia and osteoporosis Maureen [name] is at higher risk of osteopenia and osteoporosis due to vitamin D deficiency.   ASSESSMENT AND PLAN:  Insulin resistance - Plan: metFORMIN (GLUCOPHAGE) 500 MG tablet  Vitamin D deficiency - Plan: Vitamin D, Ergocalciferol, (DRISDOL) 1.25 MG (50000 UT) CAPS capsule  At risk for osteoporosis  Class 1 obesity with serious comorbidity and body mass index (BMI) of 32.0 to 32.9 in adult, unspecified obesity type  PLAN:  Insulin Resistance Maureen [name] will continue to work on weight loss, exercise, and decreasing simple carbohydrates in her diet to help decrease the risk of diabetes. She was informed that eating too many simple  carbohydrates or too many calories at one sitting increases the likelihood of GI side effects. Maureen [name] agreed to continue her diet, exercise, and metformin 500 mg qAM #30 with no refills and prescription was written today. We will check labs in 3 months and Maureen [name] agreed to follow up with [name] as directed to monitor her progress in 2 weeks.  Vitamin D Deficiency Maureen [name] was informed that low vitamin D levels contribute to fatigue and are associated with obesity, breast, and colon cancer. Maureen [name] agrees to continue to take prescription Vit D ,000 IU every week #4 with no refills and will follow up for routine testing of vitamin D, at least 2-3 times per year. She was informed of the risk of over-replacement of vitamin D and agrees to not increase her dose unless she discusses this with [name] first. We will recheck labs in 3 months. [name] agrees to follow up in 2 weeks as directed.  At risk for osteopenia and osteoporosis Brooksie was given extended (15 minutes) osteoporosis prevention counseling today. Maureen [name] is at risk for osteopenia and osteoporsis due to her vitamin D deficiency. She was encouraged to take her vitamin D and follow her higher calcium diet and increase strengthening exercise to help strengthen her bones and decrease her risk of osteopenia and osteoporosis.  Obesity Maureen [name] is currently in the action stage of change. As such, her goal is to continue with weight loss efforts. She has agreed to follow the Category 2 plan. Maureen [name] has been instructed to work up to a  goal of 150 minutes of combined cardio and strengthening exercise per week for weight loss and overall health benefits. We discussed the following Behavioral Modification Strategies today: increasing lean protein intake, decreasing simple carbohydrates, work on meal planning and easy cooking plans, emotional eating strategies, ways to avoid boredom eating, keeping healthy foods in the home, better snacking choices, and ways to avoid  night time snacking.  Maureen [name] has agreed to follow up with our clinic in 2 weeks vie telehealth. She was informed of the importance of frequent follow up visits to maximize her success with intensive lifestyle modifications for her multiple health conditions.  ALLERGIES: Allergies  Allergen Reactions  . Lactose Intolerance (Gi)   . Vicodin [Hydrocodone-Acetaminophen] Nausea And Vomiting    MEDICATIONS: Current Outpatient Medications on File Prior to Visit  Medication Sig Dispense Refill  . amLODipine (NORVASC) 2.5 MG tablet Take 2.5 mg by mouth daily.    . cetirizine (ZYRTEC) 10 MG tablet Take 10 mg by mouth daily.    . cyclobenzaprine (FLEXERIL) 10 MG tablet Take 10 mg by mouth 3 (three) times daily as needed for muscle spasms.    . fluticasone (FLONASE) 50 MCG/ACT nasal spray Place 2 sprays into both nostrils daily.    Maureen [name] ibuprofen (ADVIL,MOTRIN) 200 MG tablet Take 200 mg by mouth every 6 (six) hours as needed.    Maureen [name] levothyroxine (SYNTHROID, LEVOTHROID) 100 MCG tablet Take 100 mcg by mouth daily before breakfast.    . loratadine (CLARITIN) 10 MG tablet Take 10 mg by mouth daily.    Maureen [name] LORazepam (ATIVAN) 0.5 MG tablet Take 0.5 mg by mouth at bedtime.    . metFORMIN (GLUCOPHAGE) 500 MG tablet Take 1 tablet (500 mg total) by mouth daily with breakfast. 30 tablet 0  . naproxen sodium (ALEVE) 220 MG tablet Take 220 mg by mouth daily as needed.    . norethindrone-ethinyl estradiol (JUNEL FE,GILDESS FE,LOESTRIN FE) 1-20 MG-MCG tablet Take 1 tablet by mouth daily.    . Vitamin D, Ergocalciferol, (DRISDOL) 1.25 MG (50000 UT) CAPS capsule Take 1 capsule (50,000 Units total) by mouth every 7 (seven) days. 4 capsule 0   No current facility-administered medications on file prior to visit.     PAST MEDICAL HISTORY: Past Medical History:  Diagnosis Date  . Abdominal pain   . Anxiety   . Back pain   . Constipation   . Depression   . Dyspnea   . GERD (gastroesophageal reflux disease)   . HTN  (hypertension)   . Hypothyroidism   . Lactose intolerance   . Vaginal bleeding, abnormal     PAST SURGICAL HISTORY: Past Surgical History:  Procedure Laterality Date  . CERCLAGE LAPAROSCOPIC ABDOMINAL     [phone]    SOCIAL HISTORY: Social History   Tobacco Use  . Smoking status: Never Smoker  . Smokeless tobacco: Never Used  Substance Use Topics  . Alcohol use: Not on file  . Drug use: Not on file    FAMILY HISTORY: Family History  Problem Relation Age of Onset  . Hypertension Mother   . Thyroid disease Mother   . Depression Mother   . Anxiety disorder Mother    ROS: Review of Systems  Constitutional: Positive for weight loss.  Gastrointestinal: Negative for nausea and vomiting.  Musculoskeletal:       Negative for muscle weakness.   PHYSICAL EXAM: Blood pressure 139/86, pulse 80, height 5\' 8"  (1.727 m), weight 216 lb (98 kg), SpO2 100 %. Body mass index is  32.84 kg/m. Physical Exam Vitals signs reviewed.  Constitutional:      Appearance: Normal appearance. She is obese.  Cardiovascular:     Rate and Rhythm: Normal rate.  Pulmonary:     Effort: Pulmonary effort is normal.  Musculoskeletal: Normal range of motion.  Skin:    General: Skin is warm and dry.  Neurological:     Mental Status: She is alert and oriented to person, place, and time.  Psychiatric:        Mood and Affect: Mood normal.        Behavior: Behavior normal.    RECENT LABS AND TESTS: BMET    Component Value Date/Time   NA 138 03/07/2018 1032   K 4.6 03/07/2018 1032   CL 102 03/07/2018 1032   CO2 21 03/07/2018 1032   GLUCOSE 81 03/07/2018 1032   BUN 11 03/07/2018 1032   CREATININE 0.91 03/07/2018 1032   CALCIUM 9.3 03/07/2018 1032   GFRNONAA 74 03/07/2018 1032   GFRAA 85 03/07/2018 1032   Lab Results  Component Value Date   HGBA1C 5.3 03/07/2018   Lab Results  Component Value Date   INSULIN 16.5 03/07/2018   CBC    Component Value Date/Time   WBC 8.3  03/07/2018 1032   RBC 4.86 03/07/2018 1032   HGB 13.4 03/07/2018 1032   HCT 41.0 03/07/2018 1032   MCV 84 03/07/2018 1032   MCH 27.6 03/07/2018 1032   MCHC 32.7 03/07/2018 1032   RDW 13.2 03/07/2018 1032   LYMPHSABS 2.2 03/07/2018 1032   EOSABS 0.2 03/07/2018 1032   BASOSABS 0.1 03/07/2018 1032   Iron/TIBC/Ferritin/ %Sat No results found for: IRON, TIBC, FERRITIN, IRONPCTSAT Lipid Panel     Component Value Date/Time   CHOL 197 03/07/2018 1032   TRIG 93 03/07/2018 1032   HDL 61 03/07/2018 1032   LDLCALC 117 (H) 03/07/2018 1032   Hepatic Function Panel     Component Value Date/Time   PROT 6.8 03/07/2018 1032   ALBUMIN 4.3 03/07/2018 1032   AST 16 03/07/2018 1032   ALT 10 03/07/2018 1032   ALKPHOS 69 03/07/2018 1032   BILITOT 0.3 03/07/2018 1032      Component Value Date/Time   TSH 0.528 03/07/2018 1032   Results for ELSA, PLOCH (MRN [id number]) as of 04/09/2018 16:51  Ref. Range 03/07/2018 10:32  Vitamin D, 25-Hydroxy Latest Ref Range: 30.0 - 100.0 ng/mL 12.6 (L)   OBESITY BEHAVIORAL INTERVENTION VISIT  Today's visit was # 3  Starting weight: 220 lbs Starting date: 03/07/18 Today's weight : Weight: 216 lb (98 kg)  Today's date: 04/09/2018 Total lbs lost to date: 4    04/09/2018  Height  (1.727 m)  Weight 216 lb (98 kg)  BMI (Calculated) 32.85  BLOOD PRESSURE - SYSTOLIC 139  BLOOD PRESSURE - DIASTOLIC 86   Body Fat % 38.4 %  Total Body Water (lbs) 80.2 lbs   ASK: We discussed the diagnosis of obesity with Thurnell Lose today and Danniell agreed to give [name] permission to discuss obesity behavioral modification therapy today.  ASSESS: Terrell has the diagnosis of obesity and her BMI today is 32.85. Hadli is in the action stage of change.   ADVISE: Tahari was educated on the multiple health risks of obesity as well as the benefit of weight loss to improve her health. She was advised of the need for long term treatment and the importance of lifestyle  modifications to improve her current health and to decrease  her risk of future health problems.  AGREE: Multiple dietary modification options and treatment options were discussed and Thia agreed to follow the recommendations documented in the above note.  ARRANGE: Detria was educated on the importance of frequent visits to treat obesity as outlined per CMS and USPSTF guidelines and agreed to schedule her next follow up appointment today.   IKirke Corin, CMA, am acting as transcriptionist for Wilder Glade, MD  I have reviewed the above documentation for accuracy and completeness, and I agree with the above. -Quillian Quince, MD

## 2018-04-10 ENCOUNTER — Encounter (INDEPENDENT_AMBULATORY_CARE_PROVIDER_SITE_OTHER): Payer: Self-pay

## 2018-04-10 NOTE — Telephone Encounter (Signed)
Please review

## 2018-04-12 ENCOUNTER — Encounter (INDEPENDENT_AMBULATORY_CARE_PROVIDER_SITE_OTHER): Payer: Self-pay

## 2018-04-19 ENCOUNTER — Ambulatory Visit (HOSPITAL_BASED_OUTPATIENT_CLINIC_OR_DEPARTMENT_OTHER): Admit: 2018-04-19 | Payer: BC Managed Care – PPO | Admitting: Obstetrics and Gynecology

## 2018-04-19 ENCOUNTER — Encounter (HOSPITAL_BASED_OUTPATIENT_CLINIC_OR_DEPARTMENT_OTHER): Payer: Self-pay

## 2018-04-19 SURGERY — XI ROBOTIC ASSISTED LAPAROSCOPIC HYSTERECTOMY AND SALPINGECTOMY
Anesthesia: General | Laterality: Bilateral

## 2018-04-20 ENCOUNTER — Other Ambulatory Visit (INDEPENDENT_AMBULATORY_CARE_PROVIDER_SITE_OTHER): Payer: Self-pay | Admitting: Family Medicine

## 2018-04-20 DIAGNOSIS — E8881 Metabolic syndrome: Secondary | ICD-10-CM

## 2018-04-20 DIAGNOSIS — E559 Vitamin D deficiency, unspecified: Secondary | ICD-10-CM

## 2018-04-24 ENCOUNTER — Other Ambulatory Visit: Payer: Self-pay

## 2018-04-24 ENCOUNTER — Encounter (INDEPENDENT_AMBULATORY_CARE_PROVIDER_SITE_OTHER): Payer: Self-pay | Admitting: Family Medicine

## 2018-04-24 ENCOUNTER — Ambulatory Visit (INDEPENDENT_AMBULATORY_CARE_PROVIDER_SITE_OTHER): Payer: BC Managed Care – PPO | Admitting: Family Medicine

## 2018-04-24 DIAGNOSIS — E88819 Insulin resistance, unspecified: Secondary | ICD-10-CM

## 2018-04-24 DIAGNOSIS — F3289 Other specified depressive episodes: Secondary | ICD-10-CM

## 2018-04-24 DIAGNOSIS — E559 Vitamin D deficiency, unspecified: Secondary | ICD-10-CM | POA: Diagnosis not present

## 2018-04-24 DIAGNOSIS — E669 Obesity, unspecified: Secondary | ICD-10-CM

## 2018-04-24 DIAGNOSIS — Z6832 Body mass index (BMI) 32.0-32.9, adult: Secondary | ICD-10-CM

## 2018-04-24 DIAGNOSIS — E8881 Metabolic syndrome: Secondary | ICD-10-CM | POA: Diagnosis not present

## 2018-04-24 MED ORDER — VITAMIN D (ERGOCALCIFEROL) 1.25 MG (50000 UNIT) PO CAPS: 50000.0000 [IU] | ORAL_CAPSULE | ORAL | 0 refills | Status: DC

## 2018-04-24 MED ORDER — METFORMIN HCL 500 MG PO TABS: 500.0000 mg | ORAL_TABLET | Freq: Every day | ORAL | 0 refills | Status: DC

## 2018-04-24 MED ORDER — BUPROPION HCL ER (SR) 150 MG PO TB12: 150.0000 mg | ORAL_TABLET | Freq: Every day | ORAL | 0 refills | Status: DC

## 2018-04-25 NOTE — Progress Notes (Signed)
Office: [phone]  /  Fax: [phone] TeleHealth Visit:  Maureen [name] has verbally consented to this TeleHealth visit today. The patient is located at home, the provider is located at the [facility] office. The participants in this visit include the listed provider and patient. The visit was conducted today via Face Time.  HPI:   Chief Complaint: OBESITY Maureen [name] is here to discuss her progress with her obesity treatment plan. She is on the Category 2 plan and is following her eating plan approximately 70 % of the time. She states she is walking 2 to 3 miles for 60 to 70 minutes 5 times per week. Maureen [name] is working from home. She is walking 3 miles per day 5 times a week and feels good, but is struggling with increased stress and emotional eating. She is doing well eating everything on her Category 2 plan.  We were unable to weigh the patient today for this TeleHealth visit. She feels as if she has gained weight since her last visit. She has lost 4 lbs since starting treatment with [name].  Pre-Diabetes Maureen [name] has a diagnosis of pre-diabetes based on her elevated Hgb A1c and was informed this puts her at greater risk of developing diabetes. She is stable on metformin currently and continues to work on diet and exercise to decrease risk of diabetes. She denies nausea, vomiting, or hypoglycemia.   Vitamin D Deficiency Maureen [name] has a diagnosis of vitamin D deficiency. She is currently stable on vit D, but is not yet at goal.   Depression with emotional eating behaviors Maureen [name] has a new diagnosis of depression. She notes increased stress and comfort eating, which is worse in the afternoon and while working from home. She often snacks when she is not hungry. Maureen [name] sometimes feels she is out of control and then feels guilty that she made poor food choices. She has been working on behavior modification techniques to help reduce her emotional eating and has been somewhat successful. She  shows no sign of suicidal or homicidal ideations.  ASSESSMENT AND PLAN:  Insulin resistance - Plan: metFORMIN (GLUCOPHAGE) 500 MG tablet  Vitamin D deficiency - Plan: Vitamin D, Ergocalciferol, (DRISDOL) 1.25 MG (50000 UT) CAPS capsule  Other depression - with emotional eating - Plan: buPROPion (WELLBUTRIN SR) 150 MG 12 hr tablet  Class 1 obesity with serious comorbidity and body mass index (BMI) of 32.0 to 32.9 in adult, unspecified obesity type  PLAN:  Pre-Diabetes Maureen [name] will continue to work on weight loss, exercise, and decreasing simple carbohydrates in her diet to help decrease the risk of diabetes. She was informed that eating too many simple carbohydrates or too many calories at one sitting increases the likelihood of GI side effects. Maureen [name] agreed to continue metformin 500 mg qAM #30 with no refills and a prescription was written today. Maureen [name] agreed to follow up with [name] as directed to monitor her progress in 3 weeks.  Vitamin D Deficiency Maureen [name] was informed that low vitamin D levels contribute to fatigue and are associated with obesity, breast, and colon cancer. Maureen [name] agrees to continue to take prescription Vit D @50 ,000 IU every week #4 with no refills and will follow up for routine testing of vitamin D, at least 2-3 times per year. She was informed of the risk of over-replacement of vitamin D and agrees to not increase her dose unless she discusses this with [name] first. Maureen [name] agrees to follow up in 3 weeks as directed.  Depression with Emotional  Eating Behaviors We discussed behavior modification techniques today to help Maureen [name] deal with her emotional eating and depression. She has agreed to start Wellbutrin SR 150 mg qAM #30 with no refills and agreed to follow up as directed.   Obesity Maureen [name] is currently in the action stage of change. As such, her goal is to continue with weight loss efforts. She has agreed to follow the Category 2 plan. Maureen [name] has been instructed to work  up to a goal of 150 minutes of combined cardio and strengthening exercise per week for weight loss and overall health benefits. We discussed the following Behavioral Modification Strategies today: emotional eating strategies, ways to avoid boredom eating, better snacking choices, and ways to avoid night time snacking.  Maureen [name] has agreed to follow up with our clinic in 3 weeks. She was informed of the importance of frequent follow up visits to maximize her success with intensive lifestyle modifications for her multiple health conditions.  ALLERGIES: Allergies  Allergen Reactions  . Lactose Intolerance (Gi)   . Vicodin [Hydrocodone-Acetaminophen] Nausea And Vomiting    MEDICATIONS: Current Outpatient Medications on File Prior to Visit  Medication Sig Dispense Refill  . amLODipine (NORVASC) 2.5 MG tablet Take 2.5 mg by mouth daily.    . cetirizine (ZYRTEC) 10 MG tablet Take 10 mg by mouth daily.    . cyclobenzaprine (FLEXERIL) 10 MG tablet Take 10 mg by mouth 3 (three) times daily as needed for muscle spasms.    . fluticasone (FLONASE) 50 MCG/ACT nasal spray Place 2 sprays into both nostrils daily.    Marland Kitchen. ibuprofen (ADVIL,MOTRIN) 200 MG tablet Take 200 mg by mouth every 6 (six) hours as needed.    Marland Kitchen. levothyroxine (SYNTHROID, LEVOTHROID) 100 MCG tablet Take 100 mcg by mouth daily before breakfast.    . loratadine (CLARITIN) 10 MG tablet Take 10 mg by mouth daily.    Marland Kitchen. LORazepam (ATIVAN) 0.5 MG tablet Take 0.5 mg by mouth at bedtime.    . naproxen sodium (ALEVE) 220 MG tablet Take 220 mg by mouth daily as needed.    . norethindrone-ethinyl estradiol (JUNEL FE,GILDESS FE,LOESTRIN FE) 1-20 MG-MCG tablet Take 1 tablet by mouth daily.     No current facility-administered medications on file prior to visit.     PAST MEDICAL HISTORY: Past Medical History:  Diagnosis Date  . Abdominal pain   . Anxiety   . Back pain   . Constipation   . Depression   . Dyspnea   . GERD (gastroesophageal reflux  disease)   . HTN (hypertension)   . Hypothyroidism   . Lactose intolerance   . Vaginal bleeding, abnormal     PAST SURGICAL HISTORY: Past Surgical History:  Procedure Laterality Date  . CERCLAGE LAPAROSCOPIC ABDOMINAL     [phone],2004    SOCIAL HISTORY: Social History   Tobacco Use  . Smoking status: Never Smoker  . Smokeless tobacco: Never Used  Substance Use Topics  . Alcohol use: Not on file  . Drug use: Not on file    FAMILY HISTORY: Family History  Problem Relation Age of Onset  . Hypertension Mother   . Thyroid disease Mother   . Depression Mother   . Anxiety disorder Mother     ROS: Review of Systems  Gastrointestinal: Negative for nausea and vomiting.  Endo/Heme/Allergies:       Negative for hypoglycemia.    PHYSICAL EXAM: Pt in no acute distress  RECENT LABS AND TESTS: BMET    Component Value Date/Time  NA 138 03/07/2018 1032   K 4.6 03/07/2018 1032   CL 102 03/07/2018 1032   CO2 21 03/07/2018 1032   GLUCOSE 81 03/07/2018 1032   BUN 11 03/07/2018 1032   CREATININE 0.91 03/07/2018 1032   CALCIUM 9.3 03/07/2018 1032   GFRNONAA 74 03/07/2018 1032   GFRAA 85 03/07/2018 1032   Lab Results  Component Value Date   HGBA1C 5.3 03/07/2018   Lab Results  Component Value Date   INSULIN 16.5 03/07/2018   CBC    Component Value Date/Time   WBC 8.3 03/07/2018 1032   RBC 4.86 03/07/2018 1032   HGB 13.4 03/07/2018 1032   HCT 41.0 03/07/2018 1032   MCV 84 03/07/2018 1032   MCH 27.6 03/07/2018 1032   MCHC 32.7 03/07/2018 1032   RDW 13.2 03/07/2018 1032   LYMPHSABS 2.2 03/07/2018 1032   EOSABS 0.2 03/07/2018 1032   BASOSABS 0.1 03/07/2018 1032   Iron/TIBC/Ferritin/ %Sat No results found for: IRON, TIBC, FERRITIN, IRONPCTSAT Lipid Panel     Component Value Date/Time   CHOL 197 03/07/2018 1032   TRIG 93 03/07/2018 1032   HDL 61 03/07/2018 1032   LDLCALC 117 (H) 03/07/2018 1032   Hepatic Function Panel     Component Value Date/Time    PROT 6.8 03/07/2018 1032   ALBUMIN 4.3 03/07/2018 1032   AST 16 03/07/2018 1032   ALT 10 03/07/2018 1032   ALKPHOS 69 03/07/2018 1032   BILITOT 0.3 03/07/2018 1032      Component Value Date/Time   TSH 0.528 03/07/2018 1032   Results for DAMYRA, LUSCHER (MRN [id number]) as of 04/25/2018 06:35  Ref. Range 03/07/2018 10:32  Vitamin D, 25-Hydroxy Latest Ref Range: 30.0 - 100.0 ng/mL 12.6 (L)    I, Kirke Corin, CMA, am acting as transcriptionist for Wilder Glade, MD I have reviewed the above documentation for accuracy and completeness, and I agree with the above. -Quillian Quince, MD

## 2018-04-30 ENCOUNTER — Encounter (INDEPENDENT_AMBULATORY_CARE_PROVIDER_SITE_OTHER): Payer: Self-pay | Admitting: Family Medicine

## 2018-04-30 NOTE — Telephone Encounter (Signed)
Please review

## 2018-05-14 ENCOUNTER — Encounter (INDEPENDENT_AMBULATORY_CARE_PROVIDER_SITE_OTHER): Payer: Self-pay | Admitting: Family Medicine

## 2018-05-14 ENCOUNTER — Ambulatory Visit (INDEPENDENT_AMBULATORY_CARE_PROVIDER_SITE_OTHER): Payer: BC Managed Care – PPO | Admitting: Family Medicine

## 2018-05-14 ENCOUNTER — Other Ambulatory Visit: Payer: Self-pay

## 2018-05-14 DIAGNOSIS — Z6832 Body mass index (BMI) 32.0-32.9, adult: Secondary | ICD-10-CM

## 2018-05-14 DIAGNOSIS — E559 Vitamin D deficiency, unspecified: Secondary | ICD-10-CM | POA: Diagnosis not present

## 2018-05-14 DIAGNOSIS — E669 Obesity, unspecified: Secondary | ICD-10-CM | POA: Diagnosis not present

## 2018-05-14 DIAGNOSIS — E8881 Metabolic syndrome: Secondary | ICD-10-CM

## 2018-05-14 DIAGNOSIS — F3289 Other specified depressive episodes: Secondary | ICD-10-CM | POA: Diagnosis not present

## 2018-05-14 MED ORDER — VITAMIN D (ERGOCALCIFEROL) 1.25 MG (50000 UNIT) PO CAPS: 50000.0000 [IU] | ORAL_CAPSULE | ORAL | 0 refills | Status: DC

## 2018-05-14 MED ORDER — TOPIRAMATE 25 MG PO TABS: 25.0000 mg | ORAL_TABLET | Freq: Every day | ORAL | 0 refills | Status: DC

## 2018-05-14 MED ORDER — METFORMIN HCL 500 MG PO TABS: 500.0000 mg | ORAL_TABLET | Freq: Every day | ORAL | 0 refills | Status: DC

## 2018-05-14 NOTE — Progress Notes (Signed)
Office: [phone]  /  Fax: [phone] TeleHealth Visit:  Maureen [name] has verbally consented to this TeleHealth visit today. The patient is located at home, the provider is located at the [facility] office. The participants in this visit include the listed provider and patient. The visit was conducted today via FaceTime.  HPI:   Chief Complaint: OBESITY Maureen [name] is here to discuss her progress with her obesity treatment plan. She is on the Category 2 plan and is following her eating plan approximately 75% of the time. She states she is walking 2.5-3.0 miles 4 times per week. Maureen [name] states it has been challenging to find all of the food for the plan due to the pandemic. She reports her weight is mostly stable but states she is snacking too much at night.  We were unable to weigh the patient today for this TeleHealth visit. She states her weight is 218 lbs. She has lost 4 lbs since starting treatment with us.  Vitamin D deficiency Maureen [name] has a diagnosis of Vitamin D deficiency, which is not at goal. Her last Vitamin D level was reported to be 12.6 on 03/07/2018. She is currently taking prescription Vit D and denies nausea, vomiting or muscle weakness.  Insulin Resistance Maureen [name] has a diagnosis of insulin resistance based on her elevated fasting insulin level >5. Although Monika's blood glucose readings are still under good control, insulin resistance puts her at greater risk of metabolic syndrome and diabetes. She is taking metformin currently and continues to work on diet and exercise to decrease risk of diabetes. She denies polyphagia but does report cravings. Lab Results  Component Value Date   HGBA1C 5.3 03/07/2018    Depression with emotional eating behaviors Maureen [name] states bupropion was stopped due to side effects (skin crawling). She is interested in another medication to assist with food cravings. She reports having cravings after dinner. However, this has  improved somewhat. She denies depression and mood is stable.She often snacks when she is not hungry. Surie sometimes feels she is out of control and then feels guilty that she made poor food choices. She has been working on behavior modification techniques to help reduce her emotional eating and has been somewhat successful. She shows no sign of suicidal or homicidal ideations.   Depression screen PHQ 2/9 03/07/2018  Decreased Interest 2  Down, Depressed, Hopeless 2  PHQ - 2 Score 4  Altered sleeping 3  Tired, decreased energy 3  Change in appetite 3  Feeling bad or failure about yourself  1  Trouble concentrating 1  Moving slowly or fidgety/restless 0  Suicidal thoughts 0  PHQ-9 Score 15  Difficult doing work/chores Somewhat difficult   ASSESSMENT AND PLAN:  Vitamin D deficiency - Plan: Vitamin D, Ergocalciferol, (DRISDOL) 1.25 MG (50000 UT) CAPS capsule  Insulin resistance - Plan: metFORMIN (GLUCOPHAGE) 500 MG tablet  Other depression - with emotional eating - Plan: topiramate (TOPAMAX) 25 MG tablet  Class 1 obesity with serious comorbidity and body mass index (BMI) of 32.0 to 32.9 in adult, unspecified obesity type  PLAN:  Vitamin D Deficiency [name] was informed that low Vitamin D levels contributes to fatigue and are associated with obesity, breast, and colon cancer. She agrees to continue to take prescription Vit D @ 50,000 IU every week #4 with 0 refills and will follow-up for routine testing of Vitamin D, at least 2-3 times per year. She was informed of the risk of over-replacement of Vitamin D and agrees to not  increase her dose unless she discusses this with [name] first. [name] agrees to follow-up with our clinic in 2 weeks.  Insulin Resistance [name] will continue to work on weight loss, exercise, and decreasing simple carbohydrates in her diet to help decrease the risk of diabetes. We dicussed metformin including benefits and risks. She was informed that eating too many  simple carbohydrates or too many calories at one sitting increases the likelihood of GI side effects. Idia is currently taking metformin and a refill prescription was written today for 500 mg QAM #30 with 0 refills. Selyna agrees to follow-up with our clinic in 2 weeks to monitor her progress.  Depression with Emotional Eating Behaviors We discussed behavior modification techniques today to help [name] deal with her emotional eating and depression. She has discontinued bupropion. A new prescription was written for Topamax 25 mg QD #30 with 0 refills. She was informed of side effects and that topiramate is teratogenic. She currently takes birth control pills. She agrees to follow-up with our clinic in 2 weeks.  Obesity [name] is currently in the action stage of change. As such, her goal is to continue with weight loss efforts. She has agreed to follow the Category 2 plan or journal 1200-1300 calories + 85-90 grams of protein daily. Handouts were sent to the patient via MyChart on Journaling, [employer], and Protein Content of Food. [name] has been instructed to continue her current exercise regimen for weight loss and overall health benefits. We discussed the following Behavioral Modification Strategies today: increasing lean protein intake, decreasing simple carbohydrates, emotional eating strategies, and planning for success.  [name] has agreed to follow-up with our clinic in 2 weeks. She was informed of the importance of frequent follow-up visits to maximize her success with intensive lifestyle modifications for her multiple health conditions.  ALLERGIES: Allergies  Allergen Reactions   Lactose Intolerance (Gi)    Vicodin [Hydrocodone-Acetaminophen] Nausea And Vomiting    MEDICATIONS: Current Outpatient Medications on File Prior to Visit  Medication Sig Dispense Refill   amLODipine (NORVASC) 2.5 MG tablet Take 2.5 mg by mouth daily.     cetirizine (ZYRTEC) 10 MG tablet Take 10 mg by  mouth daily.     cyclobenzaprine (FLEXERIL) 10 MG tablet Take 10 mg by mouth 3 (three) times daily as needed for muscle spasms.     fluticasone (FLONASE) 50 MCG/ACT nasal spray Place 2 sprays into both nostrils daily.     ibuprofen (ADVIL,MOTRIN) 200 MG tablet Take 200 mg by mouth every 6 (six) hours as needed.     levothyroxine (SYNTHROID, LEVOTHROID) 100 MCG tablet Take 100 mcg by mouth daily before breakfast.     loratadine (CLARITIN) 10 MG tablet Take 10 mg by mouth daily.     LORazepam (ATIVAN) 0.5 MG tablet Take 0.5 mg by mouth at bedtime.     naproxen sodium (ALEVE) 220 MG tablet Take 220 mg by mouth daily as needed.     norethindrone-ethinyl estradiol (JUNEL FE,GILDESS FE,LOESTRIN FE) 1-20 MG-MCG tablet Take 1 tablet by mouth daily.     No current facility-administered medications on file prior to visit.     PAST MEDICAL HISTORY: Past Medical History:  Diagnosis Date   Abdominal pain    Anxiety    Back pain    Constipation    Depression    Dyspnea    GERD (gastroesophageal reflux disease)    HTN (hypertension)    Hypothyroidism    Lactose intolerance    Vaginal bleeding, abnormal  PAST SURGICAL HISTORY: Past Surgical History:  Procedure Laterality Date   CERCLAGE LAPAROSCOPIC ABDOMINAL     [phone]    SOCIAL HISTORY: Social History   Tobacco Use   Smoking status: Never Smoker   Smokeless tobacco: Never Used  Substance Use Topics   Alcohol use: Not on file   Drug use: Not on file    FAMILY HISTORY: Family History  Problem Relation Age of Onset   Hypertension Mother    Thyroid disease Mother    Depression Mother    Anxiety disorder Mother    ROS: Review of Systems  Gastrointestinal: Negative for nausea and vomiting.  Musculoskeletal:       Negative for muscle weakness.  Endo/Heme/Allergies:       Negative for polyphagia.  Psychiatric/Behavioral: Positive for depression (emotional eating). Negative for suicidal  ideas.       Negative for homicidal ideas.   PHYSICAL EXAM: Pt in no acute distress  RECENT LABS AND TESTS: BMET    Component Value Date/Time   NA 138 03/07/2018 1032   K 4.6 03/07/2018 1032   CL 102 03/07/2018 1032   CO2 21 03/07/2018 1032   GLUCOSE 81 03/07/2018 1032   BUN 11 03/07/2018 1032   CREATININE 0.91 03/07/2018 1032   CALCIUM 9.3 03/07/2018 1032   GFRNONAA 74 03/07/2018 1032   GFRAA 85 03/07/2018 1032   Lab Results  Component Value Date   HGBA1C 5.3 03/07/2018   Lab Results  Component Value Date   INSULIN 16.5 03/07/2018   CBC    Component Value Date/Time   WBC 8.3 03/07/2018 1032   RBC 4.86 03/07/2018 1032   HGB 13.4 03/07/2018 1032   HCT 41.0 03/07/2018 1032   MCV 84 03/07/2018 1032   MCH 27.6 03/07/2018 1032   MCHC 32.7 03/07/2018 1032   RDW 13.2 03/07/2018 1032   LYMPHSABS 2.2 03/07/2018 1032   EOSABS 0.2 03/07/2018 1032   BASOSABS 0.1 03/07/2018 1032   Iron/TIBC/Ferritin/ %Sat No results found for: IRON, TIBC, FERRITIN, IRONPCTSAT Lipid Panel     Component Value Date/Time   CHOL 197 03/07/2018 1032   TRIG 93 03/07/2018 1032   HDL 61 03/07/2018 1032   LDLCALC 117 (H) 03/07/2018 1032   Hepatic Function Panel     Component Value Date/Time   PROT 6.8 03/07/2018 1032   ALBUMIN 4.3 03/07/2018 1032   AST 16 03/07/2018 1032   ALT 10 03/07/2018 1032   ALKPHOS 69 03/07/2018 1032   BILITOT 0.3 03/07/2018 1032      Component Value Date/Time   TSH 0.528 03/07/2018 1032   Results for BRYER, GOTTSCH (MRN [id number]) as of 05/14/2018 15:02  Ref. Range 03/07/2018 10:32  Vitamin D, 25-Hydroxy Latest Ref Range: 30.0 - 100.0 ng/mL 12.6 (L)   I, Marianna Payment, am acting as Energy manager for [place], FNP-C.  I have reviewed the above documentation for accuracy and completeness, and I agree with the above.  - Jamieka Royle, FNP-C.

## 2018-05-15 ENCOUNTER — Encounter (INDEPENDENT_AMBULATORY_CARE_PROVIDER_SITE_OTHER): Payer: Self-pay | Admitting: Family Medicine

## 2018-05-15 DIAGNOSIS — E669 Obesity, unspecified: Secondary | ICD-10-CM | POA: Insufficient documentation

## 2018-05-15 DIAGNOSIS — E559 Vitamin D deficiency, unspecified: Secondary | ICD-10-CM | POA: Insufficient documentation

## 2018-05-15 DIAGNOSIS — E8881 Metabolic syndrome: Secondary | ICD-10-CM | POA: Insufficient documentation

## 2018-05-15 DIAGNOSIS — F329 Major depressive disorder, single episode, unspecified: Secondary | ICD-10-CM | POA: Insufficient documentation

## 2018-05-15 DIAGNOSIS — Z6832 Body mass index (BMI) 32.0-32.9, adult: Secondary | ICD-10-CM

## 2018-05-15 DIAGNOSIS — E88819 Insulin resistance, unspecified: Secondary | ICD-10-CM | POA: Insufficient documentation

## 2018-05-15 DIAGNOSIS — F32A Depression, unspecified: Secondary | ICD-10-CM | POA: Insufficient documentation

## 2018-06-04 ENCOUNTER — Other Ambulatory Visit: Payer: Self-pay

## 2018-06-04 ENCOUNTER — Ambulatory Visit (INDEPENDENT_AMBULATORY_CARE_PROVIDER_SITE_OTHER): Payer: BC Managed Care – PPO | Admitting: Family Medicine

## 2018-06-04 ENCOUNTER — Encounter (INDEPENDENT_AMBULATORY_CARE_PROVIDER_SITE_OTHER): Payer: Self-pay | Admitting: Family Medicine

## 2018-06-04 DIAGNOSIS — F3289 Other specified depressive episodes: Secondary | ICD-10-CM | POA: Diagnosis not present

## 2018-06-04 DIAGNOSIS — E559 Vitamin D deficiency, unspecified: Secondary | ICD-10-CM

## 2018-06-04 DIAGNOSIS — Z6832 Body mass index (BMI) 32.0-32.9, adult: Secondary | ICD-10-CM

## 2018-06-04 DIAGNOSIS — E669 Obesity, unspecified: Secondary | ICD-10-CM

## 2018-06-04 DIAGNOSIS — E8881 Metabolic syndrome: Secondary | ICD-10-CM

## 2018-06-04 MED ORDER — TOPIRAMATE 25 MG PO TABS: 25.0000 mg | ORAL_TABLET | Freq: Every day | ORAL | 0 refills | Status: DC

## 2018-06-04 MED ORDER — METFORMIN HCL 500 MG PO TABS: 500.0000 mg | ORAL_TABLET | Freq: Every day | ORAL | 0 refills | Status: DC

## 2018-06-04 MED ORDER — VITAMIN D (ERGOCALCIFEROL) 1.25 MG (50000 UNIT) PO CAPS: 50000.0000 [IU] | ORAL_CAPSULE | ORAL | 0 refills | Status: DC

## 2018-06-06 NOTE — Progress Notes (Signed)
Office: [phone]  /  Fax: [phone] TeleHealth Visit:  Maureen [name] has verbally consented to this TeleHealth visit today. The patient is located at home, the provider is located at the [facility] office. The participants in this visit include the listed provider and patient. The visit was conducted today via Face Time.  HPI:   Chief Complaint: OBESITY Maureen [name] is here to discuss her progress with her obesity treatment plan. She is on the Category 2 plan and is following her eating plan approximately 50 % of the time. She states she is walking 45 to 60 minutes 3 to 4 times per week. Maureen [name] has done well maintaining her weight. She has not been following her plan closely, but has tried to portion control and smarter choices. Maureen [name] has been eating vegetables, but not as much as recommended.  We were unable to weigh the patient today for this TeleHealth visit. She feels as if she has lost weight since her last visit. She has lost 4 lbs since starting treatment with [name].  Insulin Resistance Maureen [name] has a diagnosis of insulin resistance based on her elevated fasting insulin level >5. Although Maureen [name]'s blood glucose readings are still under good control, insulin resistance puts her at greater risk of metabolic syndrome and diabetes. She is stable on metformin currently and is working on diet and exercise to decrease risk of diabetes. [place] denies nausea, vomiting, or hypoglycemia.  Vitamin D Deficiency Maureen [name] has a diagnosis of vitamin D deficiency. She is currently on vit D, but is not yet at goal. Maureen [name] denies nausea, vomiting, or muscle weakness.  Depression with emotional eating behaviors [name] feels that she is doing better controlling her emotional eating and using food for comfort to the extent that it is negatively impacting her health. Her mood is good and she is sleeping good. She has been working on behavior modification techniques to help reduce her emotional  eating and has been somewhat successful.   Depression screen PHQ 2/9 03/07/2018  Decreased Interest 2  Down, Depressed, Hopeless 2  PHQ - 2 Score 4  Altered sleeping 3  Tired, decreased energy 3  Change in appetite 3  Feeling bad or failure about yourself  1  Trouble concentrating 1  Moving slowly or fidgety/restless 0  Suicidal thoughts 0  PHQ-9 Score 15  Difficult doing work/chores Somewhat difficult   ASSESSMENT AND PLAN:  Insulin resistance - Plan: metFORMIN (GLUCOPHAGE) 500 MG tablet  Vitamin D deficiency - Plan: Vitamin D, Ergocalciferol, (DRISDOL) 1.25 MG (50000 UT) CAPS capsule  Other depression - with emotional eating - Plan: topiramate (TOPAMAX) 25 MG tablet  Class 1 obesity with serious comorbidity and body mass index (BMI) of 32.0 to 32.9 in adult, unspecified obesity type  PLAN:  Insulin Resistance Maureen [name] will continue to work on weight loss, exercise, and decreasing simple carbohydrates in her diet to help decrease the risk of diabetes. She was informed that eating too many simple carbohydrates or too many calories at one sitting increases the likelihood of GI side effects. Maureen [name] agreed to continue metformin 500 mg qAM #30 with no refills and prescription was written today. Maureen [name] agreed to follow up with [name] as directed to monitor her progress in 3 weeks.  Vitamin D Deficiency Maureen [name] was informed that low vitamin D levels contribute to fatigue and are associated with obesity, breast, and colon cancer. Maureen [name] agrees to continue to take prescription Vit D @50 ,000 IU every week #4 with no refills and will follow  up for routine testing of vitamin D, at least 2-3 times per year. She was informed of the risk of over-replacement of vitamin D and agrees to not increase her dose unless she discusses this with us first. Maureen [name] agrees to follow up in 3 weeks as directed.  Depression with Emotional Eating Behaviors We discussed behavior modification techniques today to help  Maureen [name] deal with her emotional eating and depression. She has agreed to take topiramate 25 mg qd #30 with no refills and agreed to follow up as directed.  Obesity Maureen [name] is currently in the action stage of change. As such, her goal is to continue with weight loss efforts. She has agreed to follow the Category 2 plan. Maureen [name] has been instructed to work up to a goal of 150 minutes of combined cardio and strengthening exercise per week for weight loss and overall health benefits. We discussed the following Behavioral Modification Strategies today: increasing vegetables and keeping healthy foods in the home.  Maureen [name] has agreed to follow up with our clinic in 3 weeks. She was informed of the importance of frequent follow up visits to maximize her success with intensive lifestyle modifications for her multiple health conditions.  ALLERGIES: Allergies  Allergen Reactions  . Lactose Intolerance (Gi)   . Vicodin [Hydrocodone-Acetaminophen] Nausea And Vomiting    MEDICATIONS: Current Outpatient Medications on File Prior to Visit  Medication Sig Dispense Refill  . amLODipine (NORVASC) 2.5 MG tablet Take 2.5 mg by mouth daily.    . cetirizine (ZYRTEC) 10 MG tablet Take 10 mg by mouth daily.    . cyclobenzaprine (FLEXERIL) 10 MG tablet Take 10 mg by mouth 3 (three) times daily as needed for muscle spasms.    . fluticasone (FLONASE) 50 MCG/ACT nasal spray Place 2 sprays into both nostrils daily.    Marland Kitchen. ibuprofen (ADVIL,MOTRIN) 200 MG tablet Take 200 mg by mouth every 6 (six) hours as needed.    Marland Kitchen. levothyroxine (SYNTHROID, LEVOTHROID) 100 MCG tablet Take 100 mcg by mouth daily before breakfast.    . loratadine (CLARITIN) 10 MG tablet Take 10 mg by mouth daily.    Marland Kitchen. LORazepam (ATIVAN) 0.5 MG tablet Take 0.5 mg by mouth at bedtime.    . naproxen sodium (ALEVE) 220 MG tablet Take 220 mg by mouth daily as needed.    . norethindrone-ethinyl estradiol (JUNEL FE,GILDESS FE,LOESTRIN FE) 1-20 MG-MCG tablet Take  1 tablet by mouth daily.     No current facility-administered medications on file prior to visit.     PAST MEDICAL HISTORY: Past Medical History:  Diagnosis Date  . Abdominal pain   . Anxiety   . Back pain   . Constipation   . Depression   . Dyspnea   . GERD (gastroesophageal reflux disease)   . HTN (hypertension)   . Hypothyroidism   . Lactose intolerance   . Vaginal bleeding, abnormal     PAST SURGICAL HISTORY: Past Surgical History:  Procedure Laterality Date  . CERCLAGE LAPAROSCOPIC ABDOMINAL     [phone],2001,2004    SOCIAL HISTORY: Social History   Tobacco Use  . Smoking status: Never Smoker  . Smokeless tobacco: Never Used  Substance Use Topics  . Alcohol use: Not on file  . Drug use: Not on file    FAMILY HISTORY: Family History  Problem Relation Age of Onset  . Hypertension Mother   . Thyroid disease Mother   . Depression Mother   . Anxiety disorder Mother     ROS: Review of Systems  Gastrointestinal: Negative for nausea and vomiting.  Musculoskeletal:       Negative for muscle weakness.  Endo/Heme/Allergies:       Negative for hypoglycemia.  Psychiatric/Behavioral: Positive for depression.    PHYSICAL EXAM: Pt in no acute distress  RECENT LABS AND TESTS: BMET    Component Value Date/Time   NA 138 03/07/2018 1032   K 4.6 03/07/2018 1032   CL 102 03/07/2018 1032   CO2 21 03/07/2018 1032   GLUCOSE 81 03/07/2018 1032   BUN 11 03/07/2018 1032   CREATININE 0.91 03/07/2018 1032   CALCIUM 9.3 03/07/2018 1032   GFRNONAA 74 03/07/2018 1032   GFRAA 85 03/07/2018 1032   Lab Results  Component Value Date   HGBA1C 5.3 03/07/2018   Lab Results  Component Value Date   INSULIN 16.5 03/07/2018   CBC    Component Value Date/Time   WBC 8.3 03/07/2018 1032   RBC 4.86 03/07/2018 1032   HGB 13.4 03/07/2018 1032   HCT 41.0 03/07/2018 1032   MCV 84 03/07/2018 1032   MCH 27.6 03/07/2018 1032   MCHC 32.7 03/07/2018 1032   RDW 13.2 03/07/2018  1032   LYMPHSABS 2.2 03/07/2018 1032   EOSABS 0.2 03/07/2018 1032   BASOSABS 0.1 03/07/2018 1032   Iron/TIBC/Ferritin/ %Sat No results found for: IRON, TIBC, FERRITIN, IRONPCTSAT Lipid Panel     Component Value Date/Time   CHOL 197 03/07/2018 1032   TRIG 93 03/07/2018 1032   HDL 61 03/07/2018 1032   LDLCALC 117 (H) 03/07/2018 1032   Hepatic Function Panel     Component Value Date/Time   PROT 6.8 03/07/2018 1032   ALBUMIN 4.3 03/07/2018 1032   AST 16 03/07/2018 1032   ALT 10 03/07/2018 1032   ALKPHOS 69 03/07/2018 1032   BILITOT 0.3 03/07/2018 1032      Component Value Date/Time   TSH 0.528 03/07/2018 1032   Results for PROMISE, WELDIN (MRN [id number]) as of 06/06/2018 08:29  Ref. Range 03/07/2018 10:32  Vitamin D, 25-Hydroxy Latest Ref Range: 30.0 - 100.0 ng/mL 12.6 (L)     I, Kirke Corin, CMA, am acting as transcriptionist for Wilder Glade, MD I have reviewed the above documentation for accuracy and completeness, and I agree with the above. -Quillian Quince, MD

## 2018-06-10 ENCOUNTER — Encounter (INDEPENDENT_AMBULATORY_CARE_PROVIDER_SITE_OTHER): Payer: Self-pay | Admitting: Family Medicine

## 2018-06-13 NOTE — Progress Notes (Signed)
Office: [phone]  /  Fax: [phone]    Date: Jun 14, 2018 Appointment Start Time: 10:00am Duration: 60 minutes Provider: Lawerance Cruel, Psy.D. Type of Session: Intake for Individual Therapy  Location of Patient: Home Location of Provider: Provider's Home Type of Contact: Telepsychological Visit via [employer] WebEx  Informed Consent: Prior to proceeding with today's appointment, two pieces of identifying information were obtained from [place] to verify identity. In addition, Martesha's physical location at the time of this appointment was obtained. Nakaiya reported she was at home and provided the address. In the event of technical difficulties, Pari shared a phone number she could be reached at. [name] and this provider participated in today's telepsychological service. Also, Chyrel denied anyone else being present in the room or on the WebEx appointment.   The provider's role was explained to Thurnell Lose. The provider reviewed and discussed issues of confidentiality, privacy, and limits therein (e.g., reporting obligations). In addition to verbal informed consent, written informed consent for psychological services was obtained from [place] prior to the initial intake interview. Written consent included information concerning the practice, financial arrangements, and confidentiality and patients' rights. Since the clinic is not a 24/7 crisis center, mental health emergency resources were shared, and the provider explained MyChart, e-mail, voicemail, and/or other messaging systems should be utilized only for non-emergency reasons. This provider also explained that information obtained during appointments will be placed in Zurri's medical record in a confidential manner and relevant information will be shared with other providers at [facility] that she meets with for coordination of care. Karilyn verbally acknowledged understanding of the aforementioned, and agreed to use mental  health emergency resources discussed if needed. Moreover, Maymunah agreed information may be shared with other [facility] providers as needed for coordination of care. By signing the service agreement document, Cythina provided written consent for coordination of care.   Prior to initiating telepsychological services, Kyrianna was provided with an informed consent document, which included the development of a safety plan (i.e., an emergency contact and emergency resources) in the event of an emergency/crisis. Simren expressed understanding of the rationale of the safety plan and provided consent for this provider to reach out to her emergency contact in the event of an emergency/crisis. Jayden returned the completed consent form prior to today's appointment. This provider verbally reviewed the consent form during today's appointment prior to proceeding with the appointment. Nielle verbally acknowledged understanding that she is ultimately responsible for understanding her insurance benefits as it relates to reimbursement of telepsychological services. This provider also reviewed confidentiality, as it relates to telepsychological services, as well as the rationale for telepsychological services. More specifically, this provider's clinic is limiting in-person visits due to COVID-19. Therapeutic services will resume to in-person appointments once deemed appropriate. Aryanna expressed understanding regarding the rationale for telepsychological services. In addition, this provider explained the telepsychological services informed consent document would be considered an addendum to the initial consent document/service agreement. Miette verbally consented to proceed.   Chief Complaint/HPI: Alanea was referred by Dr. Quillian Quince. Chart review revealed Bridget sent a MyChart message to Dr. Dalbert Garnet on Jun 10, 2018 requesting to schedule an appointment with this provider. During the initial appointment with Dr.  Quillian Quince at [facility] on March 07, 2018, Enzley reported she started gaining weight when her marriage went bad and her heaviest weight ever was 220 lbs. She further reported experiencing the following: snacking frequently in the evenings, frequently drinking liquids with  calories, frequently making poor food choices, frequently eating larger portions than normal , struggling with emotional eating and waking up frquently in the middle of the night to eat. Additionally, she reported experiencing food cravings at night while watching TV and she skips breakfast and sometimes lunch.  During today's appointment, [name] reported she has not been following the structured meal plan as she has been avoiding grocery stores as much as possible due to the pandemic. When she attempted to go to [employer] in-person recently, she indicated having a panic attack. More specifically, she explained [employer] was "very warm" and she experienced difficulty breathing with her mask. Thus, she later went to another store. In addition, Valri indicated she is having to force herself to eat recently and she believes walking has assisted with maintaining her weight. Olena Leatherwood explained she requested to meet with this provider to assist with coping as she experiences "emotional cycles" and uses foods to cope and as a "reward." She indicated the onset of emotional eating was likely in childhood.   Roselina denied a history of restricting,  purging and engagement in other compensatory strategies, and has never been diagnosed with an eating disorder. She also denied a history of treatment for emotional eating. Manvir disclosed she tends to crave "anything that isn't good food." She added, "Chips, cookies, ice cream, cake and cookies." Ruchama denied any other concerns outside of the aforementioned. Verva indicated she is overwhelmed as she is trying to stop using food to cope. Ongoing stressors related to the pandemic have resulted  in decreased appetite, increased sleep, depressed mood, feeling overwhelmed with work, and increased isolation. She was unable to identify what has helped make her concern better. This was explored further and she identified traveling to [place] to see her mother was her "outlet" prior to the pandemic. Tzipora identified the following as making her concern about emotional eating worse: major transitions resulting in decreased appetite and when she eats, it is for comfort.   Furthermore, Estaline was verbally administered a questionnaire assessing various behaviors related to emotional eating. Rakayla endorsed the following: experience food cravings on a regular basis, eat certain foods when you are anxious, stressed, depressed, or your feelings are hurt, use food to help you cope with emotional situations, find food is comforting to you, overeat when you are angry or upset, not worry about what you eat when you are in a good mood and eat as a reward.  Mental Status Examination:  Appearance: neat Behavior: cooperative Mood: euthymic Affect: mood congruent Speech: normal in rate, volume, and tone Eye Contact: appropriate Psychomotor Activity: appropriate Thought Process: linear, logical, and goal directed  Content/Perceptual Disturbances: denies suicidal and homicidal ideation, plan, and intent and no hallucinations, delusions, bizarre thinking or behavior reported or observed Orientation: time, person, place and purpose of appointment Cognition/Sensorium: memory, attention, language, and fund of knowledge intact  Insight: fair Judgment: fair  Family & Psychosocial History: Kailaya reported she is not in a relationship and she divorced 10 years ago. Nimco indicated she has three adult children (ages 6, 34, and 73) and one child in high school (age 17).  She indicated she is currently employed as an Production designer, theatre/television/film at [employer] in [place]. Additionally, Cawood shared her highest level of education  obtained is a Education administrator degree in administration. Currently, Monetta's social support system consists of her best friend in [place], step-mom in [place], and family in [place]. She does not feel she has a social support system in [place], but  indicated she attends church every [phone]   Current Outpatient Medications on File Prior to Visit  Medication Sig Dispense Refill   amLODipine (NORVASC) 2.5 MG tablet Take 2.5 mg by mouth daily.     cetirizine (ZYRTEC) 10 MG tablet Take 10 mg by mouth daily.     cyclobenzaprine (FLEXERIL) 10 MG tablet Take 10 mg by mouth 3 (three) times daily as needed for muscle spasms.     fluticasone (FLONASE) 50 MCG/ACT nasal spray Place 2 sprays into both nostrils daily.     ibuprofen (ADVIL,MOTRIN) 200 MG tablet Take 200 mg by mouth every 6 (six) hours as needed.     levothyroxine (SYNTHROID, LEVOTHROID) 100 MCG tablet Take 100 mcg by mouth daily before breakfast.     loratadine (CLARITIN) 10 MG tablet Take 10 mg by mouth daily.     LORazepam (ATIVAN) 0.5 MG tablet Take 0.5 mg by mouth at bedtime.     metFORMIN (GLUCOPHAGE) 500 MG tablet Take 1 tablet (500 mg total) by mouth daily with breakfast. 30 tablet 0   naproxen sodium (ALEVE) 220 MG tablet Take 220 mg by mouth daily as needed.     norethindrone-ethinyl estradiol (JUNEL FE,GILDESS FE,LOESTRIN FE) 1-20 MG-MCG tablet Take 1 tablet by mouth daily.     topiramate (TOPAMAX) 25 MG tablet  Take 1 tablet (25 mg total) by mouth daily. 30 tablet 0   Vitamin D, Ergocalciferol, (DRISDOL) 1.25 MG (50000 UT) CAPS capsule Take 1 capsule (50,000 Units total) by mouth every 7 (seven) days. 4 capsule 0   No current facility-administered medications on file prior to visit.   Landrie denied a history of head injuries and loss of consciousness.   Mental Health History:  Amariona initially denied a history of mental health treatment, including meeting with a psychiatrist. However, she disclosed a history of a hospitalization for psychiatric reasons approximately 19 years ago due to feeling "overwhelemed" as her husband "walked out," she was unemployed with four children, and had to re-locate to another state. She indicated it was a voluntary hospitalization for one day and she initiated services at a "patient clinic" that prescribed medications after the hospitalization. She noted she was prescribed Prozac. Risa could not recall if she experienced suicidal ideation during that time, but denied a history of suicide attempts. She was also unsure if she experienced suicidal ideation during childhood and noted, "I've always leaned on faith as I got older." She believes if she experienced suicidal ideation at the time of the hospitalization, that was the last time. A brief risk assessment was completed based on Falana's uncertainty and reported history. The following protective factors were identified for Lenox: faith and children. If she were to become overwhelmed in the future, which is a sign that a crisis may occur, she identified the following coping skills she could engage in: reach out to providers, pray, walk, and read. It  was recommended she write down the aforementioned to develop a coping card and she was observed writing. Tamecia's confidence in utilizing emergency resources should the feeling of being overwhelmed intensify was assessed on a scale of one to ten where one is not confident and ten is  extremely confident. She reported her confidence is a 10. She denied access to firearms and weapons.   [name] indicated she was prescribed Wellbutrin by Dr. Dalbert GarnetBeasley, but she felt her "skin was crawling;" therefore, it was discontinued. Chart review further indicated she was prescribed Ativan, but she indicated she is no longer taking it. Additionally, she indicated her brother is diagnosed with Bipolar Disorder, and she believes her mother suffers from "some sort of mental" concern. Furthermore, [name] denied a trauma history, including psychological, physical and sexual abuse, as well as neglect.   [name] described her typical mood as on edge since the start of the pandemic and working form home. Prior to the pandemic, she explained her mood was "neutral" at work and "peaceful" at home. In addition to symptoms noted above, Meggie reported experiencing the following: decreased motivation and memory concerns recently. More specifically, she indicated she is "not on top of things" at work. While she experiences worry she explained she has "always been a worrier" and there is nothing in particular she worries about. Similarly, she noted she has struggled her whole life with concentration concerns. London denied experiencing the following: hopelessness, obsessions and compulsions, hallucinations and delusions, paranoia, mania and substance use, including alcohol, tobacco, and illicit/recreational substances. Her caffeine intake is in the form of a cup of coffee a day. Furthermore, she denied current suicidal ideation, plan, and intent; history of and current homicidal ideation, plan, and intent; and history of and current engagement in self-harm.  The following strengths were reported by [name]: encourager, supporter, and love to do things for people and help. She identified needing assistance with coping. The following strengths were observed by this provider: ability to express thoughts and feelings during the  therapeutic session, ability to establish and benefit from a therapeutic relationship, ability to learn and practice coping skills, willingness to work toward established goal(s) with the clinic and ability to engage in reciprocal conversation.  Legal History: [name] denied a history of legal involvement.   Structured Assessment Results: The Patient Health Questionnaire-9 (PHQ-9) is a self-report measure that assesses symptoms and severity of depression over the course of the last two weeks. Janika obtained a score of 21 suggesting severe depression. [name] finds the endorsed symptoms to be very difficult. Decreased interest 3  Down, depressed, hopeless 3  Altered sleeping 3  Tired, decreased energy 3  Change in appetite 3  Feeling bad or failure about yourself 0  Trouble concentrating 3  Moving slowly or fidgety/restless 3  Suicidal thoughts 0  PHQ-9 Score 21    The Generalized Anxiety Disorder-7 (GAD-7) is a brief self-report measure that assesses symptoms of anxiety over the course of the last two weeks. Shantell obtained a score of 4 suggesting minimal anxiety. [name] finds the endorsed symptoms to be extremely difficult. Nervous, anxious, on edge 0  Control/stop worrying 0  Worrying too much- different things 3  Trouble relaxing 0  Restless 0  Easily annoyed or irritable 1  Afraid-awful might happen 0  GAD-7 Score 4   Interventions: A chart review was conducted prior to the clinical intake interview. The PHQ-9, and GAD-7 were verbally administered as well as a Mood and Food questionnaire to assess various behaviors related  to emotional eating. Throughout session, empathic reflections and validation was provided. Continuing treatment with this provider was discussed and a treatment goal was established. Psychoeducation regarding emotional versus physical hunger was provided. Blayke was sent a handout via e-mail to utilize between now and the next appointment to increase awareness of  hunger patterns and subsequent eating. [name] provided verbal consent during today's appointment for this provider to send the handout via e-mail. Moreover, per history, a risk assessment was completed and a coping card was developed.   Provisional DSM-5 Diagnosis: 296.32 (F33.1) Major Depressive Disorder, Recurrent Episode, Moderate, With Anxious Distress  Plan: Eyleen appears able and willing to participate as evidenced by collaboration on a treatment goal, engagement in reciprocal conversation, and asking questions as needed for clarification. The next appointment will be scheduled in two weeks, which will be via [employer]. The following treatment goal was established: decrease emotional eating. Once this provider's office resumes in-person appointments and it is deemed appropriate, Kaydyn will be notified. For the aforementioned goal, Mackenize can benefit from biweekly individual therapy sessions that are brief in duration for approximately four to six sessions. The treatment modality will be individual therapeutic services, including an eclectic therapeutic approach utilizing techniques from Cognitive Behavioral Therapy, Patient Centered Therapy, Dialectical Behavior Therapy, Acceptance and Commitment Therapy, Interpersonal Therapy, and Cognitive Restructuring. Therapeutic approach will include various interventions as appropriate, such as validation, support, mindfulness, thought defusion, reframing, psychoeducation, values assessment, and role playing. This provider will regularly review the treatment plan and medical chart to keep informed of status changes. Iridiana expressed understanding and agreement with the initial treatment plan of care.

## 2018-06-14 ENCOUNTER — Other Ambulatory Visit: Payer: Self-pay

## 2018-06-14 ENCOUNTER — Ambulatory Visit (INDEPENDENT_AMBULATORY_CARE_PROVIDER_SITE_OTHER): Payer: BC Managed Care – PPO | Admitting: Psychology

## 2018-06-14 DIAGNOSIS — F331 Major depressive disorder, recurrent, moderate: Secondary | ICD-10-CM | POA: Diagnosis not present

## 2018-06-25 ENCOUNTER — Other Ambulatory Visit: Payer: Self-pay

## 2018-06-25 ENCOUNTER — Encounter (INDEPENDENT_AMBULATORY_CARE_PROVIDER_SITE_OTHER): Payer: Self-pay | Admitting: Family Medicine

## 2018-06-25 ENCOUNTER — Ambulatory Visit (INDEPENDENT_AMBULATORY_CARE_PROVIDER_SITE_OTHER): Payer: BC Managed Care – PPO | Admitting: Family Medicine

## 2018-06-25 DIAGNOSIS — E669 Obesity, unspecified: Secondary | ICD-10-CM | POA: Diagnosis not present

## 2018-06-25 DIAGNOSIS — F3289 Other specified depressive episodes: Secondary | ICD-10-CM | POA: Diagnosis not present

## 2018-06-25 DIAGNOSIS — Z6832 Body mass index (BMI) 32.0-32.9, adult: Secondary | ICD-10-CM

## 2018-06-25 DIAGNOSIS — E8881 Metabolic syndrome: Secondary | ICD-10-CM

## 2018-06-25 DIAGNOSIS — E559 Vitamin D deficiency, unspecified: Secondary | ICD-10-CM

## 2018-06-26 MED ORDER — TOPIRAMATE 25 MG PO TABS: 25.0000 mg | ORAL_TABLET | Freq: Every day | ORAL | 0 refills | Status: DC

## 2018-06-26 MED ORDER — VITAMIN D (ERGOCALCIFEROL) 1.25 MG (50000 UNIT) PO CAPS: 50000.0000 [IU] | ORAL_CAPSULE | ORAL | 0 refills | Status: DC

## 2018-06-26 MED ORDER — METFORMIN HCL 500 MG PO TABS: 500.0000 mg | ORAL_TABLET | Freq: Every day | ORAL | 0 refills | Status: DC

## 2018-06-26 NOTE — Progress Notes (Signed)
Office: [phone]  /  Fax: [phone] TeleHealth Visit:  Maureen [name] has verbally consented to this TeleHealth visit today. The patient is located at home, the provider is located at the [facility] office. The participants in this visit include the listed provider and patient and any and all parties involved. The visit was conducted today via FaceTime.  HPI:   Chief Complaint: OBESITY Maureen [name] is here to discuss her progress with her obesity treatment plan. She is on the Category 2 plan and is following her eating plan approximately 50 % of the time. She states she is exercising 0 minutes 0 times per week. Maureen [name] is doing better with eating mindfully and she feels he has maintained her weight since her last visit. She is doing therapy with Dr. Mallie Mussel to help identify and decrease emotional eating. Hunger is controlled. We were unable to weigh the patient today for this TeleHealth visit. She feels as if she has maintained weight since her last visit. She has lost 4 lbs since starting treatment with Maureen [name].  Vitamin D deficiency Maureen [name] has a diagnosis of vitamin D deficiency. She is currently taking vit D and denies nausea, vomiting or muscle weakness.  Insulin Resistance Maureen [name] has a diagnosis of insulin resistance based on her elevated fasting insulin level >5. Although Dominika's blood glucose readings are still under good control, insulin resistance puts her at greater risk of metabolic syndrome and diabetes. She is stable on metformin and diet. She has decreased polyphagia. Maureen [name] continues to work on diet and exercise to decrease risk of diabetes. Maureen [name] denies nausea, vomiting or hypoglycemia.  Depression with emotional eating behaviors Maureen [name] struggles with emotional eating and using food for comfort to the extent that it is negatively impacting her health. She often snacks when she is not hungry. Maureen [name] sometimes feels she is out of control and then feels guilty that she  made poor food choices. Her mood is stable on Topiramate. Maureen [name] started seeing Dr. Mallie Mussel and she feels she is doing better with decreasing emotional eating. She has been working on behavior modification techniques to help reduce her emotional eating and has been somewhat successful. She shows no sign of suicidal or homicidal ideations.  Depression screen PHQ 2/9 03/07/2018  Decreased Interest 2  Down, Depressed, Hopeless 2  PHQ - 2 Score 4  Altered sleeping 3  Tired, decreased energy 3  Change in appetite 3  Feeling bad or failure about yourself  1  Trouble concentrating 1  Moving slowly or fidgety/restless 0  Suicidal thoughts 0  PHQ-9 Score 15  Difficult doing work/chores Somewhat difficult     ASSESSMENT AND PLAN:  Vitamin D deficiency - Plan: Vitamin D, Ergocalciferol, (DRISDOL) 1.25 MG (50000 UT) CAPS capsule  Insulin resistance - Plan: metFORMIN (GLUCOPHAGE) 500 MG tablet  Other depression - with emotional eating - Plan: topiramate (TOPAMAX) 25 MG tablet  Class 1 obesity with serious comorbidity and body mass index (BMI) of 32.0 to 32.9 in adult, unspecified obesity type  PLAN:  Vitamin D Deficiency Maureen [name] was informed that low vitamin D levels contributes to fatigue and are associated with obesity, breast, and colon cancer. She agrees to continue to take prescription Vit D @50 ,000 IU every week #4 with no refills and will follow up for routine testing of vitamin D, at least 2-3 times per year. She was informed of the risk of over-replacement of vitamin D and agrees to not increase her dose unless she discusses this with Maureen [name] first.  Maureen [name] agrees to follow up as directed.  Insulin Resistance Maureen [name] will continue to work on weight loss, exercise, and decreasing simple carbohydrates in her diet to help decrease the risk of diabetes. We dicussed metformin including benefits and risks. She was informed that eating too many simple carbohydrates or too many calories at one sitting  increases the likelihood of GI side effects. Maureen [name] agrees to continue metformin 500 mg with breakfast daily #30 with no refills and follow up with us as directed to monitor her progress.  Depression with Emotional Eating Behaviors We discussed behavior modification techniques today to help Maureen [name] deal with her emotional eating and depression. She has agreed to continue Topiramate 25 mg daily #30 with no refills and follow up as directed.  Obesity Maureen [name] is currently in the action stage of change. As such, her goal is to continue with weight loss efforts She has agreed to follow the Category 2 plan Maureen [name] has been instructed to work up to a goal of 150 minutes of combined cardio and strengthening exercise per week for weight loss and overall health benefits. We discussed the following Behavioral Modification Strategies today: planning for success, better snacking choices, work on meal planning and easy cooking plans and ways to avoid boredom eating  Maureen [name] has agreed to follow up with our clinic in 3 weeks. She was informed of the importance of frequent follow up visits to maximize her success with intensive lifestyle modifications for her multiple health conditions.  ALLERGIES: Allergies  Allergen Reactions  . Lactose Intolerance (Gi)   . Vicodin [Hydrocodone-Acetaminophen] Nausea And Vomiting    MEDICATIONS: Current Outpatient Medications on File Prior to Visit  Medication Sig Dispense Refill  . amLODipine (NORVASC) 2.5 MG tablet Take 2.5 mg by mouth daily.    . cetirizine (ZYRTEC) 10 MG tablet Take 10 mg by mouth daily.    . cyclobenzaprine (FLEXERIL) 10 MG tablet Take 10 mg by mouth 3 (three) times daily as needed for muscle spasms.    . fluticasone (FLONASE) 50 MCG/ACT nasal spray Place 2 sprays into both nostrils daily.    Marland Kitchen. ibuprofen (ADVIL,MOTRIN) 200 MG tablet Take 200 mg by mouth every 6 (six) hours as needed.    Marland Kitchen. levothyroxine (SYNTHROID, LEVOTHROID) 100 MCG tablet Take 100  mcg by mouth daily before breakfast.    . loratadine (CLARITIN) 10 MG tablet Take 10 mg by mouth daily.    Marland Kitchen. LORazepam (ATIVAN) 0.5 MG tablet Take 0.5 mg by mouth at bedtime.    . naproxen sodium (ALEVE) 220 MG tablet Take 220 mg by mouth daily as needed.    . norethindrone-ethinyl estradiol (JUNEL FE,GILDESS FE,LOESTRIN FE) 1-20 MG-MCG tablet Take 1 tablet by mouth daily.     No current facility-administered medications on file prior to visit.     PAST MEDICAL HISTORY: Past Medical History:  Diagnosis Date  . Abdominal pain   . Anxiety   . Back pain   . Constipation   . Depression   . Dyspnea   . GERD (gastroesophageal reflux disease)   . HTN (hypertension)   . Hypothyroidism   . Lactose intolerance   . Vaginal bleeding, abnormal     PAST SURGICAL HISTORY: Past Surgical History:  Procedure Laterality Date  . CERCLAGE LAPAROSCOPIC ABDOMINAL     [phone],2001,2004    SOCIAL HISTORY: Social History   Tobacco Use  . Smoking status: Never Smoker  . Smokeless tobacco: Never Used  Substance Use Topics  . Alcohol use: Not on file  .  Drug use: Not on file    FAMILY HISTORY: Family History  Problem Relation Age of Onset  . Hypertension Mother   . Thyroid disease Mother   . Depression Mother   . Anxiety disorder Mother     ROS: Review of Systems  Constitutional: Negative for weight loss.  Gastrointestinal: Negative for nausea and vomiting.  Musculoskeletal:       Negative for muscle weakness  Endo/Heme/Allergies:       Positive for polyphagia Negative for hypoglycemia  Psychiatric/Behavioral: Positive for depression. Negative for suicidal ideas.    PHYSICAL EXAM: Pt in no acute distress  RECENT LABS AND TESTS: BMET    Component Value Date/Time   NA 138 03/07/2018 1032   K 4.6 03/07/2018 1032   CL 102 03/07/2018 1032   CO2 21 03/07/2018 1032   GLUCOSE 81 03/07/2018 1032   BUN 11 03/07/2018 1032   CREATININE 0.91 03/07/2018 1032   CALCIUM 9.3 03/07/2018  1032   GFRNONAA 74 03/07/2018 1032   GFRAA 85 03/07/2018 1032   Lab Results  Component Value Date   HGBA1C 5.3 03/07/2018   Lab Results  Component Value Date   INSULIN 16.5 03/07/2018   CBC    Component Value Date/Time   WBC 8.3 03/07/2018 1032   RBC 4.86 03/07/2018 1032   HGB 13.4 03/07/2018 1032   HCT 41.0 03/07/2018 1032   MCV 84 03/07/2018 1032   MCH 27.6 03/07/2018 1032   MCHC 32.7 03/07/2018 1032   RDW 13.2 03/07/2018 1032   LYMPHSABS 2.2 03/07/2018 1032   EOSABS 0.2 03/07/2018 1032   BASOSABS 0.1 03/07/2018 1032   Iron/TIBC/Ferritin/ %Sat No results found for: IRON, TIBC, FERRITIN, IRONPCTSAT Lipid Panel     Component Value Date/Time   CHOL 197 03/07/2018 1032   TRIG 93 03/07/2018 1032   HDL 61 03/07/2018 1032   LDLCALC 117 (H) 03/07/2018 1032   Hepatic Function Panel     Component Value Date/Time   PROT 6.8 03/07/2018 1032   ALBUMIN 4.3 03/07/2018 1032   AST 16 03/07/2018 1032   ALT 10 03/07/2018 1032   ALKPHOS 69 03/07/2018 1032   BILITOT 0.3 03/07/2018 1032      Component Value Date/Time   TSH 0.528 03/07/2018 1032     Ref. Range 03/07/2018 10:32  Vitamin D, 25-Hydroxy Latest Ref Range: 30.0 - 100.0 ng/mL 12.6 (L)    I, [place] CraneJoanne Murray, am acting as transcriptionist for Quillian Quincearen Jonanthan Bolender, MD I have reviewed the above documentation for accuracy and completeness, and I agree with the above. -Quillian Quincearen Jozee Hammer, MD

## 2018-06-27 NOTE — Progress Notes (Signed)
Office: [phone]  /  Fax: [phone]    Date: June 28, 2018   Appointment Start Time: 2:02pm Duration: 31 minutes Provider: Glennie Isle, Psy.D. 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 female presenting via [place] for a follow-up appointment to address the previously established treatment goal of decreasing emotional eating. Today's appointment was a telepsychological visit, as this provider's clinic is seeing a limited number of patients for in-person visits due to COVID-19. Therapeutic services will resume to in-person appointments once deemed appropriate. Maureen [name] expressed understanding regarding the rationale for telepsychological services, and provided verbal consent for today's appointment. Prior to proceeding with today's appointment, Maureen [name]'s physical location at the time of this appointment was obtained. Maureen [name] reported she was at work and provided the address. In the event of technical difficulties, Maureen [name] shared a phone number she could be reached at. Maureen [name] and this provider participated in today's telepsychological service. Also, Maureen [name] denied anyone else being present in the room or on the WebEx appointment.  This provider conducted a brief check-in and verbally administered the PHQ-9 and GAD-7. Maureen [name] reported she is focusing on praying and reading. However, she noted her walking has decreased significantly as a "quick" decision was made for her to resume work in-person in [place], which has contributed to an increase in worry in the past two weeks. The aforementioned coping skills were noted on her coping card that was developed during the last appointment. She indicated she does not "necessarily" feel the reward of engaging in the coping skills, but discussed understanding that it will take time and noted she is giving  herself a "break." As such, she noted a reduction in guilt when not feeling better after praying. Based on her ongoing worry about her daughter leaving for college and decrease in motivation, this provider discussed the option of a referral for longer-term therapeutic services. Maureen [name] was receptive and explained, "I was really set back by some of the things that came out." More specifically, she noted that after the initial appointment with this provider, she was surprised she was dealing with some of the concerns she reported, and noted, "It's deeper than food." She added, "I want wellness." During today's appointment Maureen [name] provided verbal consent for this provider to place a referral for longer-term therapeutic services for ongoing stressors, decreased motivation, and emotional eating.   Since the last appointment with this provider, Maureen [name] denied episodes of emotional eating and shared setting reminders has assisted with food intake. In regard to the handout previously provided, Maureen [name] shared, "When I do eat and I'm not thinking about it, it's TV time," which is around 7pm. She was observed smiling when reflecting what she learned about herself; this was reflected to her and positively reinforced. Between now and the next appointment, Maureen [name] was asked to continue exploring her hunger patterns and also incorporate walking into her daily routine; she agreed. Overall, Maureen [name] was receptive to today's session as evidenced by openness to sharing, responsiveness to feedback, and willingness to continue exploring her eating patterns.  Mental Status Examination:  Appearance: neat Behavior: cooperative Mood: euthymic Affect: mood congruent Speech: normal in rate, volume, and tone Eye Contact: appropriate Psychomotor Activity: appropriate Thought Process: linear, logical, and goal directed  Content/Perceptual Disturbances: denies suicidal and homicidal ideation, plan, and intent and no hallucinations,  delusions, bizarre thinking or behavior reported or observed Orientation: time,  person, place and purpose of appointment Cognition/Sensorium: memory, attention, language, and fund of knowledge intact  Insight: good Judgment: good  Structured Assessment Results: The Patient Health Questionnaire-9 (PHQ-9) is a self-report measure that assesses symptoms and severity of depression over the course of the last two weeks. Shirlena obtained a score of 8 suggesting mild depression. [name] finds the endorsed symptoms to be very difficult. Decreased interest 3  Down, depressed, hopeless 3  Altered sleeping 2  Tired, decreased energy 0  Change in appetite 0  Feeling bad or failure about yourself 0  Trouble concentrating 3  Moving slowly or fidgety/restless 0  Suicidal thoughts 0  PHQ-9 Score 8    The Generalized Anxiety Disorder-7 (GAD-7) is a brief self-report measure that assesses symptoms of anxiety over the course of the last two weeks. [place] obtained a score of 18 suggesting severe anxiety. [name] finds the endorsed symptoms to be very difficult. Nervous, anxious, on edge 3  Control/stop worrying 3  Worrying too much- different things 3  Trouble relaxing 3  Restless 3  Easily annoyed or irritable 2  Afraid-awful might happen 1  GAD-7 Score 18   Interventions:  Conducted a brief chart review Verbal administration of PHQ-9 and GAD-7 for symptom monitoring Provided empathic reflections and validation Reviewed content from the previous session Discussed option for a referral for longer-term therapeutic services Provided positive reinforcement Focused on rapport building Employed supportive psychotherapy interventions to facilitate reduced distress, and to improve coping skills with identified stressors  DSM-5 Diagnosis: 296.32 (F33.1) Major Depressive Disorder, Recurrent Episode, Moderate, With Anxious Distress  Treatment Goal & Progress: During the initial appointment with this provider,  the following treatment goal was established: decrease emotional eating. Progress is limited, as [name] has just begun treatment with this provider; however, she is receptive to the interaction and interventions and rapport is being established.   Plan: [name] continues to appear able and willing to participate as evidenced by engagement in reciprocal conversation, and asking questions for clarification as appropriate. The next appointment will be scheduled in two weeks, which will be via [employer] WebEx. Once this provider's office resumes in-person appointments and it is deemed appropriate, [name] will be notified. The next session will focus on triggers for emotional eating, as they were not introduced during today's appointment due to Shianna's presenting concerns.

## 2018-06-28 ENCOUNTER — Ambulatory Visit (INDEPENDENT_AMBULATORY_CARE_PROVIDER_SITE_OTHER): Payer: BC Managed Care – PPO | Admitting: Psychology

## 2018-06-28 ENCOUNTER — Other Ambulatory Visit: Payer: Self-pay

## 2018-06-28 DIAGNOSIS — F331 Major depressive disorder, recurrent, moderate: Secondary | ICD-10-CM

## 2018-07-02 ENCOUNTER — Ambulatory Visit (INDEPENDENT_AMBULATORY_CARE_PROVIDER_SITE_OTHER): Payer: BC Managed Care – PPO | Admitting: Professional

## 2018-07-02 DIAGNOSIS — F331 Major depressive disorder, recurrent, moderate: Secondary | ICD-10-CM | POA: Diagnosis not present

## 2018-07-10 NOTE — Progress Notes (Signed)
Office: [phone]  /  Fax: [phone]    Date: July 16, 2018   Appointment Start Time: 10:31am Duration: 25 minutes Provider: Glennie Isle, Psy.D. Type of Session: Individual Therapy  Location of Patient: Home Location of Provider: Provider's Home Type of Contact: Telepsychological Visit via [employer] WebEx   Session Content: Maureen [name] is a 48 y.o. female presenting via [place] for a follow-up appointment to address the previously established treatment goal of decreasing emotional eating. Today's appointment was a telepsychological visit, as this provider's clinic is seeing a limited number of patients for in-person visits due to COVID-19. Therapeutic services will resume to in-person appointments once deemed appropriate. Maureen [name] expressed understanding regarding the rationale for telepsychological services, and provided verbal consent for today's appointment. Prior to proceeding with today's appointment, Maureen [name]'s physical location at the time of this appointment was obtained. Maureen [name] reported she was at home and provided the address. In the event of technical difficulties, Maureen [name] shared a phone number she could be reached at. Maureen [name] and this provider participated in today's telepsychological service. Also, Maureen [name] denied anyone else being present in the room or on the WebEx appointment.  This provider conducted a brief check-in and verbally administered the PHQ-9 and GAD-7. Maureen [name] reported, "There's been a lot of changes at work." She further noted she is currently on vacation for two weeks and shared she went to [place]. This provider checked-in regarding when the initial appointment per the referral placed was scheduled. She shared she met with Dr. Theda Sers twice and she noted, "That's going really well." Maureen [name] shared she is scheduled to have her 3rd appointment with Dr. Theda Sers today. She acknowledged the possible impact on insurance for having two mental health appointments in one day. She  also expressed desire to terminate services with this provider now that she is established with a new provider. Regarding emotional eating, Maureen [name] reported engaging in emotional eating while on vacation as there were snacks. She indicated she brought the remaining snacks home, which she has consumed since returning home. Psychoeducation regarding triggers for emotional eating was provided and she was encouraged to share her identified triggers with Dr. Theda Sers; she agreed. Maureen [name] was provided a handout to increase awareness of triggers and frequency. This provider also discussed behavioral strategies for specific triggers, such as placing the utensil down when conversing to avoid mindless eating. Maureen [name] provided verbal consent during today's appointment for this provider to send the handout for triggers via e-mail. Maureen [name] was receptive to today's session as evidenced by openness to sharing, responsiveness to feedback, and willingness to explore triggers for emotional eating. She noted, "This was really great."  Mental Status Examination:  Appearance: neat Behavior: cooperative Mood: euthymic Affect: mood congruent Speech: normal in rate, volume, and tone Eye Contact: appropriate Psychomotor Activity: appropriate Thought Process: linear, logical, and goal directed  Content/Perceptual Disturbances: denies suicidal and homicidal ideation, plan, and intent and no hallucinations, delusions, bizarre thinking or behavior reported or observed Orientation: time, person, place and purpose of appointment Cognition/Sensorium: memory, attention, language, and fund of knowledge intact  Insight: good Judgment: good  Structured Assessment Results: The Patient Health Questionnaire-9 (PHQ-9) is a self-report measure that assesses symptoms and severity of depression over the course of the last two weeks. Maureen [name] obtained a score of 2 suggesting minimal depression. Maureen [name] finds the endorsed symptoms to be not difficult  at all. Little interest or pleasure in doing things 0  Feeling down, depressed, or hopeless 1  Trouble falling or staying asleep, or sleeping too  much 0  Feeling tired or having little energy 0  Poor appetite or overeating 1  Feeling bad about yourself --- or that you are a failure or have let yourself or your family down 0  Trouble concentrating on things, such as reading the newspaper or watching television 0  Moving or speaking so slowly that other people could have noticed? Or the opposite --- being so fidgety or restless that you have been moving around a lot more than usual 0  Thoughts that you would be better off dead or hurting yourself in some way 0  PHQ-9 Score 2    The Generalized Anxiety Disorder-7 (GAD-7) is a brief self-report measure that assesses symptoms of anxiety over the course of the last two weeks. Maureen [name] obtained a score of 3 suggesting minimal anxiety. Maureen [name] finds the endorsed symptoms to be somewhat difficult. Feeling nervous, anxious, on edge 0  Not being able to stop or control worrying 0  Worrying too much about different things 0  Trouble relaxing 0  Being so restless that it's hard to sit still 0  Becoming easily annoyed or irritable 0  Feeling afraid as if something awful might happen [COVID-19 related concern] 3  GAD-7 Score 3   Interventions:  Conducted a brief chart review Verbal administration of PHQ-9 and GAD-7 for symptom monitoring Provided empathic reflections and validation Psychoeducation provided regarding triggers for emotional eating Provided positive reinforcement Employed supportive psychotherapy interventions to facilitate reduced distress, and to improve coping skills with identified stressors  DSM-5 Diagnosis: 296.32 (F33.1) Major Depressive Disorder, Recurrent Episode, Moderate, With Anxious Distress  Treatment Goal & Progress: During the initial appointment with this provider, the following treatment goal was established: decrease  emotional eating. Maureen [name] has demonstrated some progress in her goal as evidenced by increased awareness of hunger patterns and identification of triggers for emotional eating.   Plan: Per Baelynn's request, today was the last appointment with this provider, as she is established with a new mental health provider.

## 2018-07-11 ENCOUNTER — Ambulatory Visit (INDEPENDENT_AMBULATORY_CARE_PROVIDER_SITE_OTHER): Payer: BC Managed Care – PPO | Admitting: Professional

## 2018-07-11 DIAGNOSIS — F331 Major depressive disorder, recurrent, moderate: Secondary | ICD-10-CM

## 2018-07-16 ENCOUNTER — Encounter (INDEPENDENT_AMBULATORY_CARE_PROVIDER_SITE_OTHER): Payer: Self-pay | Admitting: Family Medicine

## 2018-07-16 ENCOUNTER — Ambulatory Visit (INDEPENDENT_AMBULATORY_CARE_PROVIDER_SITE_OTHER): Payer: BC Managed Care – PPO | Admitting: Family Medicine

## 2018-07-16 ENCOUNTER — Ambulatory Visit (INDEPENDENT_AMBULATORY_CARE_PROVIDER_SITE_OTHER): Payer: BC Managed Care – PPO | Admitting: Psychology

## 2018-07-16 ENCOUNTER — Other Ambulatory Visit: Payer: Self-pay

## 2018-07-16 DIAGNOSIS — E559 Vitamin D deficiency, unspecified: Secondary | ICD-10-CM

## 2018-07-16 DIAGNOSIS — Z6832 Body mass index (BMI) 32.0-32.9, adult: Secondary | ICD-10-CM

## 2018-07-16 DIAGNOSIS — E8881 Metabolic syndrome: Secondary | ICD-10-CM

## 2018-07-16 DIAGNOSIS — E669 Obesity, unspecified: Secondary | ICD-10-CM

## 2018-07-16 DIAGNOSIS — F331 Major depressive disorder, recurrent, moderate: Secondary | ICD-10-CM

## 2018-07-16 DIAGNOSIS — F3289 Other specified depressive episodes: Secondary | ICD-10-CM | POA: Diagnosis not present

## 2018-07-16 MED ORDER — TOPIRAMATE 25 MG PO TABS: 25.0000 mg | ORAL_TABLET | Freq: Every day | ORAL | 0 refills | Status: DC

## 2018-07-16 MED ORDER — VITAMIN D (ERGOCALCIFEROL) 1.25 MG (50000 UNIT) PO CAPS: 50000.0000 [IU] | ORAL_CAPSULE | ORAL | 0 refills | Status: DC

## 2018-07-16 MED ORDER — METFORMIN HCL 500 MG PO TABS: 500.0000 mg | ORAL_TABLET | Freq: Every day | ORAL | 0 refills | Status: DC

## 2018-07-17 NOTE — Progress Notes (Signed)
Office: [phone]  /  Fax: [phone] TeleHealth Visit:  Maureen [name] has verbally consented to this TeleHealth visit today. The patient is located at home, the provider is located at the [facility] office. The participants in this visit include the listed provider and patient. The visit was conducted today via face time.  HPI:   Chief Complaint: OBESITY Maureen [name] is here to discuss her progress with her obesity treatment plan. She is on the Category 2 plan and is following her eating plan approximately 20 % of the time. She states she is walking for 60 minutes 2 times per week. Maureen [name] feels she has done well maintaining her weight. She has deviated from her plan significantly, but she is trying to be mindful of her food choices. She states she is ready to get back on track.  We were unable to weigh the patient today for this TeleHealth visit. She feels as if she has maintained her weight since her last visit. She has lost 20 lbs since starting treatment with [name].  Insulin Resistance Maureen [name] has a diagnosis of insulin resistance based on her elevated fasting insulin level >5. Although Maureen [name]'s blood glucose readings are still under good control, insulin resistance puts her at greater risk of metabolic syndrome and diabetes. She is stable on metformin on metformin, she is deviating from her diet more but she is ready to get back on track.  Vitamin D Deficiency Maureen [name] has a diagnosis of vitamin D deficiency. She is stable on prescription Vit D, and she is due for labs. She denies nausea, vomiting or muscle weakness.  Depression with Emotional Eating Behaviors Maureen [name] 's mood is stable. She is seeing a therapist and feels the topiramate is helping with emotional eating. She feels she is more in control of her cravings. Maureen [name] struggles with emotional eating and using food for comfort to the extent that it is negatively impacting her health. She often snacks when she is not  hungry. Maureen [name] sometimes feels she is out of control and then feels guilty that she made poor food choices. She has been working on behavior modification techniques to help reduce her emotional eating and has been somewhat successful. She shows no sign of suicidal or homicidal ideations.  Depression screen PHQ 2/9 03/07/2018  Decreased Interest 2  Down, Depressed, Hopeless 2  PHQ - 2 Score 4  Altered sleeping 3  Tired, decreased energy 3  Change in appetite 3  Feeling bad or failure about yourself  1  Trouble concentrating 1  Moving slowly or fidgety/restless 0  Suicidal thoughts 0  PHQ-9 Score 15  Difficult doing work/chores Somewhat difficult    ASSESSMENT AND PLAN:  Insulin resistance - Plan: metFORMIN (GLUCOPHAGE) 500 MG tablet  Other depression - with emotional eating - Plan: topiramate (TOPAMAX) 25 MG tablet  Vitamin D deficiency - Plan: Vitamin D, Ergocalciferol, (DRISDOL) 1.25 MG (50000 UT) CAPS capsule  Class 1 obesity with serious comorbidity and body mass index (BMI) of 32.0 to 32.9 in adult, unspecified obesity type  PLAN:  Insulin Resistance Maureen [name] will continue to work on weight loss, exercise, and decreasing simple carbohydrates in her diet to help decrease the risk of diabetes. We dicussed metformin including benefits and risks. She was informed that eating too many simple carbohydrates or too many calories at one sitting increases the likelihood of GI side effects. Maureen [name] agrees to continue taking metformin 500 mg q AM #30 and we will refill for 1 month. We will recheck  labs at next office visit. Maureen [name] agrees to follow up with our clinic in 2 weeks as directed to monitor Maureen [name] progress.  Vitamin D Deficiency Maureen [name] was informed that low vitamin D levels contributes to fatigue and are associated with obesity, breast, and colon cancer. Maureen [name] agrees to continue taking prescription Vit D 50,000 IU every week #4 and we will refill for 1 month. She will follow up for  routine testing of vitamin D, at least 2-3 times per year. She was informed of the risk of over-replacement of vitamin D and agrees to not increase her dose unless she discusses this with us first. We will recheck labs at next office visit. Maureen [name] agrees to follow up with our clinic in 2 weeks.  Depression with Emotional Eating Behaviors We discussed behavior modification techniques today to help Maureen [name] deal with her emotional eating and depression. Maureen [name] agrees to continue taking topiramate 25 mg qd #30 and we will refill for 1 month. Maureen [name] agrees to follow up with our clinic in 2 weeks.  I spent > than 50% of the 25 minute visit on counseling as documented in the note.  Obesity Maureen [name] is currently in the action stage of change. As such, her goal is to continue with weight loss efforts She has agreed to follow the Category 2 plan Maureen [name] has been instructed to work up to a goal of 150 minutes of combined cardio and strengthening exercise per week for weight loss and overall health benefits. We discussed the following Behavioral Modification Strategies today: increasing lean protein intake, decreasing simple carbohydrates, decrease eating out, work on meal planning and easy cooking plans, emotional eating strategies, and no skipping meals   Maureen [name] has agreed to follow up with our clinic in 2 weeks. She was informed of the importance of frequent follow up visits to maximize her success with intensive lifestyle modifications for her multiple health conditions.  ALLERGIES: Allergies  Allergen Reactions   Lactose Intolerance (Gi)    Vicodin [Hydrocodone-Acetaminophen] Nausea And Vomiting    MEDICATIONS: Current Outpatient Medications on File Prior to Visit  Medication Sig Dispense Refill   amLODipine (NORVASC) 2.5 MG tablet Take 2.5 mg by mouth daily.     cetirizine (ZYRTEC) 10 MG tablet Take 10 mg by mouth daily.     cyclobenzaprine (FLEXERIL) 10 MG tablet Take 10 mg by mouth 3  (three) times daily as needed for muscle spasms.     fluticasone (FLONASE) 50 MCG/ACT nasal spray Place 2 sprays into both nostrils daily.     ibuprofen (ADVIL,MOTRIN) 200 MG tablet Take 200 mg by mouth every 6 (six) hours as needed.     levothyroxine (SYNTHROID, LEVOTHROID) 100 MCG tablet Take 100 mcg by mouth daily before breakfast.     loratadine (CLARITIN) 10 MG tablet Take 10 mg by mouth daily.     LORazepam (ATIVAN) 0.5 MG tablet Take 0.5 mg by mouth at bedtime.     naproxen sodium (ALEVE) 220 MG tablet Take 220 mg by mouth daily as needed.     norethindrone-ethinyl estradiol (JUNEL FE,GILDESS FE,LOESTRIN FE) 1-20 MG-MCG tablet Take 1 tablet by mouth daily.     No current facility-administered medications on file prior to visit.     PAST MEDICAL HISTORY: Past Medical History:  Diagnosis Date   Abdominal pain    Anxiety    Back pain    Constipation    Depression    Dyspnea    GERD (gastroesophageal reflux disease)    HTN (hypertension)  Hypothyroidism    Lactose intolerance    Vaginal bleeding, abnormal     PAST SURGICAL HISTORY: Past Surgical History:  Procedure Laterality Date   CERCLAGE LAPAROSCOPIC ABDOMINAL     [phone],2001,2004    SOCIAL HISTORY: Social History   Tobacco Use   Smoking status: Never Smoker   Smokeless tobacco: Never Used  Substance Use Topics   Alcohol use: Not on file   Drug use: Not on file    FAMILY HISTORY: Family History  Problem Relation Age of Onset   Hypertension Mother    Thyroid disease Mother    Depression Mother    Anxiety disorder Mother     ROS: Review of Systems  Constitutional: Negative for weight loss.  Gastrointestinal: Negative for nausea and vomiting.  Musculoskeletal:       Negative muscle weakness  Psychiatric/Behavioral: Positive for depression. Negative for suicidal ideas.    PHYSICAL EXAM: Pt in no acute distress  RECENT LABS AND TESTS: BMET    Component Value Date/Time     NA 138 03/07/2018 1032   K 4.6 03/07/2018 1032   CL 102 03/07/2018 1032   CO2 21 03/07/2018 1032   GLUCOSE 81 03/07/2018 1032   BUN 11 03/07/2018 1032   CREATININE 0.91 03/07/2018 1032   CALCIUM 9.3 03/07/2018 1032   GFRNONAA 74 03/07/2018 1032   GFRAA 85 03/07/2018 1032   Lab Results  Component Value Date   HGBA1C 5.3 03/07/2018   Lab Results  Component Value Date   INSULIN 16.5 03/07/2018   CBC    Component Value Date/Time   WBC 8.3 03/07/2018 1032   RBC 4.86 03/07/2018 1032   HGB 13.4 03/07/2018 1032   HCT 41.0 03/07/2018 1032   MCV 84 03/07/2018 1032   MCH 27.6 03/07/2018 1032   MCHC 32.7 03/07/2018 1032   RDW 13.2 03/07/2018 1032   LYMPHSABS 2.2 03/07/2018 1032   EOSABS 0.2 03/07/2018 1032   BASOSABS 0.1 03/07/2018 1032   Iron/TIBC/Ferritin/ %Sat No results found for: IRON, TIBC, FERRITIN, IRONPCTSAT Lipid Panel     Component Value Date/Time   CHOL 197 03/07/2018 1032   TRIG 93 03/07/2018 1032   HDL 61 03/07/2018 1032   LDLCALC 117 (H) 03/07/2018 1032   Hepatic Function Panel     Component Value Date/Time   PROT 6.8 03/07/2018 1032   ALBUMIN 4.3 03/07/2018 1032   AST 16 03/07/2018 1032   ALT 10 03/07/2018 1032   ALKPHOS 69 03/07/2018 1032   BILITOT 0.3 03/07/2018 1032      Component Value Date/Time   TSH 0.528 03/07/2018 1032      I, Burt KnackSharon Martin, am acting as Energy managertranscriptionist for Quillian Quincearen Rosco Harriott, MD I have reviewed the above documentation for accuracy and completeness, and I agree with the above. -Quillian Quincearen Sharman Garrott, MD

## 2018-07-18 ENCOUNTER — Ambulatory Visit: Payer: BC Managed Care – PPO | Admitting: Professional

## 2018-07-25 ENCOUNTER — Ambulatory Visit (INDEPENDENT_AMBULATORY_CARE_PROVIDER_SITE_OTHER): Payer: BC Managed Care – PPO | Admitting: Professional

## 2018-07-25 DIAGNOSIS — F331 Major depressive disorder, recurrent, moderate: Secondary | ICD-10-CM | POA: Diagnosis not present

## 2018-07-30 ENCOUNTER — Ambulatory Visit (INDEPENDENT_AMBULATORY_CARE_PROVIDER_SITE_OTHER): Payer: BC Managed Care – PPO | Admitting: Family Medicine

## 2018-07-31 ENCOUNTER — Ambulatory Visit (INDEPENDENT_AMBULATORY_CARE_PROVIDER_SITE_OTHER): Payer: BC Managed Care – PPO | Admitting: Professional

## 2018-07-31 DIAGNOSIS — F331 Major depressive disorder, recurrent, moderate: Secondary | ICD-10-CM | POA: Diagnosis not present

## 2018-08-03 ENCOUNTER — Other Ambulatory Visit (INDEPENDENT_AMBULATORY_CARE_PROVIDER_SITE_OTHER): Payer: Self-pay | Admitting: Family Medicine

## 2018-08-03 DIAGNOSIS — F3289 Other specified depressive episodes: Secondary | ICD-10-CM

## 2018-08-07 ENCOUNTER — Encounter (INDEPENDENT_AMBULATORY_CARE_PROVIDER_SITE_OTHER): Payer: Self-pay | Admitting: Family Medicine

## 2018-08-07 ENCOUNTER — Ambulatory Visit (INDEPENDENT_AMBULATORY_CARE_PROVIDER_SITE_OTHER): Payer: BC Managed Care – PPO | Admitting: Family Medicine

## 2018-08-07 ENCOUNTER — Ambulatory Visit: Payer: BC Managed Care – PPO | Admitting: Professional

## 2018-08-07 ENCOUNTER — Other Ambulatory Visit: Payer: Self-pay

## 2018-08-07 VITALS — BP 115/75 | HR 79 | Temp 98.1°F | Ht 68.0 in | Wt 206.0 lb

## 2018-08-07 DIAGNOSIS — F3289 Other specified depressive episodes: Secondary | ICD-10-CM | POA: Diagnosis not present

## 2018-08-07 DIAGNOSIS — Z9189 Other specified personal risk factors, not elsewhere classified: Secondary | ICD-10-CM | POA: Diagnosis not present

## 2018-08-07 DIAGNOSIS — E559 Vitamin D deficiency, unspecified: Secondary | ICD-10-CM

## 2018-08-07 DIAGNOSIS — R7303 Prediabetes: Secondary | ICD-10-CM

## 2018-08-07 DIAGNOSIS — Z6831 Body mass index (BMI) 31.0-31.9, adult: Secondary | ICD-10-CM

## 2018-08-07 DIAGNOSIS — E669 Obesity, unspecified: Secondary | ICD-10-CM

## 2018-08-07 MED ORDER — TOPIRAMATE 25 MG PO TABS: 25.0000 mg | ORAL_TABLET | Freq: Every day | ORAL | 0 refills | Status: DC

## 2018-08-07 MED ORDER — METFORMIN HCL 500 MG PO TABS: 500.0000 mg | ORAL_TABLET | Freq: Every day | ORAL | 0 refills | Status: DC

## 2018-08-07 MED ORDER — VITAMIN D (ERGOCALCIFEROL) 1.25 MG (50000 UNIT) PO CAPS: 50000.0000 [IU] | ORAL_CAPSULE | ORAL | 0 refills | Status: DC

## 2018-08-08 NOTE — Progress Notes (Signed)
Office: [phone]  /  Fax: [phone]   HPI:   Chief Complaint: OBESITY Maureen [name] is here to discuss her progress with her obesity treatment plan. She is on the Category 2 plan and is following her eating plan approximately 80% of the time. She states she is walking 60 minutes 2 times per week. Selam's last in-office visit was 4 months ago and she has lost 10 lbs in that time. Overall she has done better with emotional eating. She does continue to struggle with meal planning at times. Her weight is 206 lb (93.4 kg) today and has had a weight loss of 10 pounds over a period of 4 months since her last in-officer visit. She has lost 14 lbs since starting treatment with us.  Pre-Diabetes Maureen [name] has a diagnosis of prediabetes based on her elevated Hgb A1c and was informed this puts her at greater risk of developing diabetes. She is stable on metformin currently and continues to work on diet and exercise to decrease risk of diabetes. She denies nausea, vomiting, or hypoglycemia.  Vitamin D deficiency Maureen [name] has a diagnosis of Vitamin D deficiency. She is currently stable on prescription Vit D and denies nausea, vomiting or muscle weakness.  At risk for osteopenia and osteoporosis Maureen [name] is at higher risk of osteopenia and osteoporosis due to vitamin D deficiency.   Depression with emotional eating behaviors Maureen [name] is struggling with emotional eating and using food for comfort to the extent that it is negatively impacting her health. She often snacks when she is not hungry. Mckaela sometimes feels she is out of control and then feels guilty that she made poor food choices. She has been working on behavior modification techniques to help reduce her emotional eating and has been somewhat successful. Jereline's mood is stable on Topamax. She still struggles with emotional eating but has done better at recognizing emotional eating. She shows no sign of suicidal or homicidal ideations.  Depression  screen PHQ 2/9 03/07/2018  Decreased Interest 2  Down, Depressed, Hopeless 2  PHQ - 2 Score 4  Altered sleeping 3  Tired, decreased energy 3  Change in appetite 3  Feeling bad or failure about yourself  1  Trouble concentrating 1  Moving slowly or fidgety/restless 0  Suicidal thoughts 0  PHQ-9 Score 15  Difficult doing work/chores Somewhat difficult   ASSESSMENT AND PLAN:  Vitamin D deficiency - Plan: Vitamin D, Ergocalciferol, (DRISDOL) 1.25 MG (50000 UT) CAPS capsule  Prediabetes - Plan: metFORMIN (GLUCOPHAGE) 500 MG tablet  Other depression - with emotional eating - Plan: topiramate (TOPAMAX) 25 MG tablet  Class 1 obesity with serious comorbidity and body mass index (BMI) of 31.0 to 31.9 in adult, unspecified obesity type  PLAN:  Pre-Diabetes Maureen [name] will continue to work on weight loss, exercise, and decreasing simple carbohydrates in her diet to help decrease the risk of diabetes. We dicussed metformin including benefits and risks. She was informed that eating too many simple carbohydrates or too many calories at one sitting increases the likelihood of GI side effects. Maureen [name] was given a refill on her metformin 500 mg #30 with 0 refills and agrees to follow-up with our clinic in 3 weeks.  Vitamin D Deficiency Maureen [name] was informed that low Vitamin D levels contributes to fatigue and are associated with obesity, breast, and colon cancer. She agrees to continue to take prescription Vit D @ 50,000 IU every week #4 with 0 refills and will follow-up for routine testing of Vitamin D, at least 2-3  times per year. She was informed of the risk of over-replacement of Vitamin D and agrees to not increase her dose unless she discusses this with [name] first. Elvina agrees to follow-up with our clinic in 3 weeks.  At risk for osteopenia and osteoporosis Jenesa was given extended  (15 minutes) osteoporosis prevention counseling today. Keyanah is at risk for osteopenia and osteoporsis due to her  vitamin D deficiency. She was encouraged to take her vitamin D and follow her higher calcium diet and increase strengthening exercise to help strengthen her bones and decrease her risk of osteopenia and osteoporosis.  Depression with Emotional Eating Behaviors We discussed behavior modification techniques today to help Kahlan deal with her emotional eating and depression. Faryal was given a refill on her Topamax 25 mg #30 with 0 refills. She agrees to follow-up with our clinic in 3 weeks.  Obesity Jewel is currently in the action stage of change. As such, her goal is to continue with weight loss efforts. She has agreed to follow the Category 2 plan. Terria has been instructed to work up to a goal of 150 minutes of combined cardio and strengthening exercise per week for weight loss and overall health benefits. We discussed the following Behavioral Modification Strategies today: increasing lean protein intake, decreasing simple carbohydrates, work on meal planning and easy cooking plans, and better snacking choices.  Tumeka has agreed to follow-up with our clinic in 3 weeks. She was informed of the importance of frequent follow-up visits to maximize her success with intensive lifestyle modifications for her multiple health conditions.  ALLERGIES: Allergies  Allergen Reactions  . Lactose Intolerance (Gi)   . Vicodin [Hydrocodone-Acetaminophen] Nausea And Vomiting    MEDICATIONS: Current Outpatient Medications on File Prior to Visit  Medication Sig Dispense Refill  . amLODipine (NORVASC) 2.5 MG tablet Take 2.5 mg by mouth daily.    . cyclobenzaprine (FLEXERIL) 10 MG tablet Take 10 mg by mouth 3 (three) times daily as needed for muscle spasms.    Marland Kitchen levothyroxine (SYNTHROID, LEVOTHROID) 100 MCG tablet Take 100 mcg by mouth daily before breakfast.    . LORazepam (ATIVAN) 0.5 MG tablet Take 0.5 mg by mouth at bedtime.    . norethindrone-ethinyl estradiol (JUNEL FE,GILDESS FE,LOESTRIN FE) 1-20  MG-MCG tablet Take 1 tablet by mouth daily.     No current facility-administered medications on file prior to visit.     PAST MEDICAL HISTORY: Past Medical History:  Diagnosis Date  . Abdominal pain   . Anxiety   . Back pain   . Constipation   . Depression   . Dyspnea   . GERD (gastroesophageal reflux disease)   . HTN (hypertension)   . Hypothyroidism   . Lactose intolerance   . Vaginal bleeding, abnormal     PAST SURGICAL HISTORY: Past Surgical History:  Procedure Laterality Date  . CERCLAGE LAPAROSCOPIC ABDOMINAL     [phone]    SOCIAL HISTORY: Social History   Tobacco Use  . Smoking status: Never Smoker  . Smokeless tobacco: Never Used  Substance Use Topics  . Alcohol use: Not on file  . Drug use: Not on file    FAMILY HISTORY: Family History  Problem Relation Age of Onset  . Hypertension Mother   . Thyroid disease Mother   . Depression Mother   . Anxiety disorder Mother    ROS: Review of Systems  Gastrointestinal: Negative for nausea and vomiting.  Musculoskeletal:       Negative for muscle weakness.  Endo/Heme/Allergies:  Negative for hypoglycemia.  Psychiatric/Behavioral: Positive for depression (emotional eating). Negative for suicidal ideas.       Negative for homicidal ideas.   PHYSICAL EXAM: Blood pressure 115/75, pulse 79, temperature 98.1 F (36.7 C), temperature source Oral, height 5\' 8"  (1.727 m), weight 206 lb (93.4 kg), SpO2 100 %. Body mass index is 31.32 kg/m. Physical Exam Vitals signs reviewed.  Constitutional:      Appearance: Normal appearance. She is obese.  Cardiovascular:     Rate and Rhythm: Normal rate.     Pulses: Normal pulses.  Pulmonary:     Effort: Pulmonary effort is normal.     Breath sounds: Normal breath sounds.  Musculoskeletal: Normal range of motion.  Skin:    General: Skin is warm and dry.  Neurological:     Mental Status: She is alert and oriented to person, place, and time.  Psychiatric:         Behavior: Behavior normal.   RECENT LABS AND TESTS: BMET    Component Value Date/Time   NA 138 03/07/2018 1032   K 4.6 03/07/2018 1032   CL 102 03/07/2018 1032   CO2 21 03/07/2018 1032   GLUCOSE 81 03/07/2018 1032   BUN 11 03/07/2018 1032   CREATININE 0.91 03/07/2018 1032   CALCIUM 9.3 03/07/2018 1032   GFRNONAA 74 03/07/2018 1032   GFRAA 85 03/07/2018 1032   Lab Results  Component Value Date   HGBA1C 5.3 03/07/2018   Lab Results  Component Value Date   INSULIN 16.5 03/07/2018   CBC    Component Value Date/Time   WBC 8.3 03/07/2018 1032   RBC 4.86 03/07/2018 1032   HGB 13.4 03/07/2018 1032   HCT 41.0 03/07/2018 1032   MCV 84 03/07/2018 1032   MCH 27.6 03/07/2018 1032   MCHC 32.7 03/07/2018 1032   RDW 13.2 03/07/2018 1032   LYMPHSABS 2.2 03/07/2018 1032   EOSABS 0.2 03/07/2018 1032   BASOSABS 0.1 03/07/2018 1032   Iron/TIBC/Ferritin/ %Sat No results found for: IRON, TIBC, FERRITIN, IRONPCTSAT Lipid Panel     Component Value Date/Time   CHOL 197 03/07/2018 1032   TRIG 93 03/07/2018 1032   HDL 61 03/07/2018 1032   LDLCALC 117 (H) 03/07/2018 1032   Hepatic Function Panel     Component Value Date/Time   PROT 6.8 03/07/2018 1032   ALBUMIN 4.3 03/07/2018 1032   AST 16 03/07/2018 1032   ALT 10 03/07/2018 1032   ALKPHOS 69 03/07/2018 1032   BILITOT 0.3 03/07/2018 1032      Component Value Date/Time   TSH 0.528 03/07/2018 1032   Results for Thurnell LoseGRAVES, Gurleen V (MRN [id number]) as of 08/08/2018 14:41  Ref. Range 03/07/2018 10:32  Vitamin D, 25-Hydroxy Latest Ref Range: 30.0 - 100.0 ng/mL 12.6 (L)   OBESITY BEHAVIORAL INTERVENTION VISIT  Today's visit was #9  Starting weight: 220 lbs Starting date: 03/07/2018 Today's weight: 206 lbs  Today's date: 08/07/2018 Total lbs lost to date: 14   08/07/2018  Height 5\' 8"  (1.727 m)  Weight 206 lb (93.4 kg)  BMI (Calculated) 31.33  BLOOD PRESSURE - SYSTOLIC 115  BLOOD PRESSURE - DIASTOLIC 75   Body Fat %  37.6 %  Total Body Water (lbs) 82.2 lbs   ASK: We discussed the diagnosis of obesity with Thurnell LoseJeneen V Frappier today and Aleksia agreed to give us permission to discuss obesity behavioral modification therapy today.  ASSESS: Maureen [name] has the diagnosis of obesity and her BMI today is 31.4. Maureen [name] is  in the action stage of change.   ADVISE: Maureen [name] was educated on the multiple health risks of obesity as well as the benefit of weight loss to improve her health. She was advised of the need for long term treatment and the importance of lifestyle modifications to improve her current health and to decrease her risk of future health problems.  AGREE: Multiple dietary modification options and treatment options were discussed and  Rick agreed to follow the recommendations documented in the above note.  ARRANGE: Maureen [name] was educated on the importance of frequent visits to treat obesity as outlined per CMS and USPSTF guidelines and agreed to schedule her next follow up appointment today.  I, Marianna Paymentenise Haag, am acting as Energy managertranscriptionist for Quillian Quincearen Ismaeel Arvelo, MD I have reviewed the above documentation for accuracy and completeness, and I agree with the above. -Quillian Quincearen Marisella Puccio, MD

## 2018-08-13 ENCOUNTER — Ambulatory Visit (INDEPENDENT_AMBULATORY_CARE_PROVIDER_SITE_OTHER): Payer: BC Managed Care – PPO | Admitting: Professional

## 2018-08-13 DIAGNOSIS — F331 Major depressive disorder, recurrent, moderate: Secondary | ICD-10-CM | POA: Diagnosis not present

## 2018-08-15 ENCOUNTER — Other Ambulatory Visit (INDEPENDENT_AMBULATORY_CARE_PROVIDER_SITE_OTHER): Payer: Self-pay | Admitting: Family Medicine

## 2018-08-15 DIAGNOSIS — R7303 Prediabetes: Secondary | ICD-10-CM

## 2018-08-15 DIAGNOSIS — E559 Vitamin D deficiency, unspecified: Secondary | ICD-10-CM

## 2018-08-15 DIAGNOSIS — F3289 Other specified depressive episodes: Secondary | ICD-10-CM

## 2018-08-16 MED ORDER — TOPIRAMATE 25 MG PO TABS: 25.0000 mg | ORAL_TABLET | Freq: Every day | ORAL | 0 refills | Status: DC

## 2018-08-16 MED ORDER — METFORMIN HCL 500 MG PO TABS: 500.0000 mg | ORAL_TABLET | Freq: Every day | ORAL | 0 refills | Status: DC

## 2018-08-16 MED ORDER — VITAMIN D (ERGOCALCIFEROL) 1.25 MG (50000 UNIT) PO CAPS: 50000.0000 [IU] | ORAL_CAPSULE | ORAL | 0 refills | Status: DC

## 2018-08-17 ENCOUNTER — Ambulatory Visit (INDEPENDENT_AMBULATORY_CARE_PROVIDER_SITE_OTHER): Payer: BC Managed Care – PPO | Admitting: Professional

## 2018-08-17 DIAGNOSIS — F331 Major depressive disorder, recurrent, moderate: Secondary | ICD-10-CM

## 2018-08-21 ENCOUNTER — Encounter (INDEPENDENT_AMBULATORY_CARE_PROVIDER_SITE_OTHER): Payer: Self-pay | Admitting: Family Medicine

## 2018-08-22 ENCOUNTER — Other Ambulatory Visit (INDEPENDENT_AMBULATORY_CARE_PROVIDER_SITE_OTHER): Payer: Self-pay

## 2018-08-22 DIAGNOSIS — R7303 Prediabetes: Secondary | ICD-10-CM

## 2018-08-22 DIAGNOSIS — E559 Vitamin D deficiency, unspecified: Secondary | ICD-10-CM

## 2018-08-22 DIAGNOSIS — F3289 Other specified depressive episodes: Secondary | ICD-10-CM

## 2018-08-22 MED ORDER — METFORMIN HCL 500 MG PO TABS: 500.0000 mg | ORAL_TABLET | Freq: Every day | ORAL | 0 refills | Status: DC

## 2018-08-22 MED ORDER — TOPIRAMATE 25 MG PO TABS: 25.0000 mg | ORAL_TABLET | Freq: Every day | ORAL | 0 refills | Status: DC

## 2018-08-22 MED ORDER — VITAMIN D (ERGOCALCIFEROL) 1.25 MG (50000 UNIT) PO CAPS: 50000.0000 [IU] | ORAL_CAPSULE | ORAL | 0 refills | Status: DC

## 2018-08-25 ENCOUNTER — Ambulatory Visit (INDEPENDENT_AMBULATORY_CARE_PROVIDER_SITE_OTHER): Payer: BC Managed Care – PPO | Admitting: Professional

## 2018-08-25 DIAGNOSIS — F331 Major depressive disorder, recurrent, moderate: Secondary | ICD-10-CM | POA: Diagnosis not present

## 2018-08-28 ENCOUNTER — Encounter (INDEPENDENT_AMBULATORY_CARE_PROVIDER_SITE_OTHER): Payer: Self-pay | Admitting: Family Medicine

## 2018-08-28 ENCOUNTER — Ambulatory Visit (INDEPENDENT_AMBULATORY_CARE_PROVIDER_SITE_OTHER): Payer: BC Managed Care – PPO | Admitting: Family Medicine

## 2018-08-28 ENCOUNTER — Other Ambulatory Visit: Payer: Self-pay

## 2018-08-28 VITALS — BP 129/82 | HR 85 | Temp 98.1°F | Ht 68.0 in | Wt 207.0 lb

## 2018-08-28 DIAGNOSIS — E7849 Other hyperlipidemia: Secondary | ICD-10-CM | POA: Diagnosis not present

## 2018-08-28 DIAGNOSIS — E669 Obesity, unspecified: Secondary | ICD-10-CM | POA: Diagnosis not present

## 2018-08-28 DIAGNOSIS — Z9189 Other specified personal risk factors, not elsewhere classified: Secondary | ICD-10-CM

## 2018-08-28 DIAGNOSIS — G4709 Other insomnia: Secondary | ICD-10-CM | POA: Diagnosis not present

## 2018-08-28 DIAGNOSIS — Z6831 Body mass index (BMI) 31.0-31.9, adult: Secondary | ICD-10-CM

## 2018-08-28 MED ORDER — TRAZODONE HCL 50 MG PO TABS: 25.0000 mg | ORAL_TABLET | Freq: Every evening | ORAL | 0 refills | Status: DC | PRN

## 2018-08-30 NOTE — Progress Notes (Signed)
Office: [phone]  /  Fax: [phone]   HPI:   Chief Complaint: OBESITY Maureen [name] is here to discuss her progress with her obesity treatment plan. She is on the Category 2 plan and is following her eating plan approximately 50% of the time. She states she is exercising 0 minutes 0 times per week. Maureen [name] hasn't been able to follow her plan recently with family troubles and her daughter's worsening depression. She is doing better now and feels she will be able to get back to her structured plan now. Her weight is 207 lb (93.9 kg) today and has had a weight gain of 1 lb since her last visit. She has lost 13 lbs since starting treatment with us.  Insomnia Maureen [name] notes difficulty falling asleep since her daughter's depression started. She feels her mind won't "shut off" and is very tired.  Hyperlipidemia Maureen [name] has hyperlipidemia and has been trying to improve her cholesterol levels with intensive lifestyle modification including a low saturated fat diet, exercise and weight loss. Her LDL was elevated at 117 on recent labs dated 03/07/2018. She denies any chest pain.  At risk for cardiovascular disease Maureen [name] is at a higher than average risk for cardiovascular disease due to obesity. She currently denies any chest pain.  ASSESSMENT AND PLAN:  Other insomnia - Plan: traZODone (DESYREL) 50 MG tablet  Other hyperlipidemia  At risk for heart disease  Class 1 obesity with serious comorbidity and body mass index (BMI) of 31.0 to 31.9 in adult, unspecified obesity type  PLAN:  Insomnia Maureen [name] will start Trazodone 50 mg QHS #30 with 0 refills and will follow-up with our clinic in 2-3 weeks.  Hyperlipidemia Maureen [name] was informed of the American Heart Association Guidelines emphasizing intensive lifestyle modifications as the first line treatment for hyperlipidemia. We discussed many lifestyle modifications today in depth, and Maureen [name] will continue to work on decreasing saturated fats such as  fatty red meat, butter and many fried foods. She will continue diet and exercise, increase vegetables and lean protein in her diet and continue to work on exercise and weight loss efforts.  Cardiovascular risk counseling Maureen [name] was given extended (15 minutes) coronary artery disease prevention counseling today. She is 48 y.o. female and has risk factors for heart disease including obesity. We discussed intensive lifestyle modifications today with an emphasis on specific weight loss instructions and strategies. Pt was also informed of the importance of increasing exercise and decreasing saturated fats to help prevent heart disease.  Obesity Maureen [name] is currently in the action stage of change. As such, her goal is to continue with weight loss efforts She has agreed to follow the Category 2 plan Maureen [name] has been instructed to work up to a goal of 150 minutes of combined cardio and strengthening exercise per week for weight loss and overall health benefits. We discussed the following Behavioral Modification Strategies today: increasing lean protein intake, decreasing simple carbohydrates, and emotional eating strategies.  Maureen [name] has agreed to follow-up with our clinic in 2-3 weeks. She was informed of the importance of frequent follow-up visits to maximize her success with intensive lifestyle modifications for her multiple health conditions.  ALLERGIES: Allergies  Allergen Reactions  . Lactose Intolerance (Gi)   . Vicodin [Hydrocodone-Acetaminophen] Nausea And Vomiting    MEDICATIONS: Current Outpatient Medications on File Prior to Visit  Medication Sig Dispense Refill  . amLODipine (NORVASC) 2.5 MG tablet Take 2.5 mg by mouth daily.    . cyclobenzaprine (FLEXERIL) 10 MG tablet Take 10 mg by  mouth 3 (three) times daily as needed for muscle spasms.    Marland Kitchen. levothyroxine (SYNTHROID, LEVOTHROID) 100 MCG tablet Take 100 mcg by mouth daily before breakfast.    . LORazepam (ATIVAN) 0.5 MG tablet Take 0.5  mg by mouth at bedtime.    . metFORMIN (GLUCOPHAGE) 500 MG tablet Take 1 tablet (500 mg total) by mouth daily with breakfast. 30 tablet 0  . norethindrone-ethinyl estradiol (JUNEL FE,GILDESS FE,LOESTRIN FE) 1-20 MG-MCG tablet Take 1 tablet by mouth daily.    Marland Kitchen. topiramate (TOPAMAX) 25 MG tablet Take 1 tablet (25 mg total) by mouth daily. 30 tablet 0  . Vitamin D, Ergocalciferol, (DRISDOL) 1.25 MG (50000 UT) CAPS capsule Take 1 capsule (50,000 Units total) by mouth every 7 (seven) days. 4 capsule 0   No current facility-administered medications on file prior to visit.     PAST MEDICAL HISTORY: Past Medical History:  Diagnosis Date  . Abdominal pain   . Anxiety   . Back pain   . Constipation   . Depression   . Dyspnea   . GERD (gastroesophageal reflux disease)   . HTN (hypertension)   . Hypothyroidism   . Lactose intolerance   . Vaginal bleeding, abnormal     PAST SURGICAL HISTORY: Past Surgical History:  Procedure Laterality Date  . CERCLAGE LAPAROSCOPIC ABDOMINAL     [phone]    SOCIAL HISTORY: Social History   Tobacco Use  . Smoking status: Never Smoker  . Smokeless tobacco: Never Used  Substance Use Topics  . Alcohol use: Not on file  . Drug use: Not on file    FAMILY HISTORY: Family History  Problem Relation Age of Onset  . Hypertension Mother   . Thyroid disease Mother   . Depression Mother   . Anxiety disorder Mother    ROS: Review of Systems  Cardiovascular: Negative for chest pain.  Psychiatric/Behavioral: The patient has insomnia.    PHYSICAL EXAM: Blood pressure 129/82, pulse 85, temperature 98.1 F (36.7 C), temperature source Oral, height 5\' 8"  (1.727 m), weight 207 lb (93.9 kg), SpO2 99 %. Body mass index is 31.47 kg/m. Physical Exam Vitals signs reviewed.  Constitutional:      Appearance: Normal appearance. She is obese.  Cardiovascular:     Rate and Rhythm: Normal rate.     Pulses: Normal pulses.  Pulmonary:     Effort: Pulmonary  effort is normal.     Breath sounds: Normal breath sounds.  Musculoskeletal: Normal range of motion.  Skin:    General: Skin is warm and dry.  Neurological:     Mental Status: She is alert and oriented to person, place, and time.  Psychiatric:        Behavior: Behavior normal.   RECENT LABS AND TESTS: BMET    Component Value Date/Time   NA 138 03/07/2018 1032   K 4.6 03/07/2018 1032   CL 102 03/07/2018 1032   CO2 21 03/07/2018 1032   GLUCOSE 81 03/07/2018 1032   BUN 11 03/07/2018 1032   CREATININE 0.91 03/07/2018 1032   CALCIUM 9.3 03/07/2018 1032   GFRNONAA 74 03/07/2018 1032   GFRAA 85 03/07/2018 1032   Lab Results  Component Value Date   HGBA1C 5.3 03/07/2018   Lab Results  Component Value Date   INSULIN 16.5 03/07/2018   CBC    Component Value Date/Time   WBC 8.3 03/07/2018 1032   RBC 4.86 03/07/2018 1032   HGB 13.4 03/07/2018 1032   HCT 41.0 03/07/2018 1032  MCV 84 03/07/2018 1032   MCH 27.6 03/07/2018 1032   MCHC 32.7 03/07/2018 1032   RDW 13.2 03/07/2018 1032   LYMPHSABS 2.2 03/07/2018 1032   EOSABS 0.2 03/07/2018 1032   BASOSABS 0.1 03/07/2018 1032   Iron/TIBC/Ferritin/ %Sat No results found for: IRON, TIBC, FERRITIN, IRONPCTSAT Lipid Panel     Component Value Date/Time   CHOL 197 03/07/2018 1032   TRIG 93 03/07/2018 1032   HDL 61 03/07/2018 1032   LDLCALC 117 (H) 03/07/2018 1032   Hepatic Function Panel     Component Value Date/Time   PROT 6.8 03/07/2018 1032   ALBUMIN 4.3 03/07/2018 1032   AST 16 03/07/2018 1032   ALT 10 03/07/2018 1032   ALKPHOS 69 03/07/2018 1032   BILITOT 0.3 03/07/2018 1032      Component Value Date/Time   TSH 0.528 03/07/2018 1032   Results for PAZ, FUENTES (MRN [id number]) as of 08/30/2018 09:46  Ref. Range 03/07/2018 10:32  Vitamin D, 25-Hydroxy Latest Ref Range: 30.0 - 100.0 ng/mL 12.6 (L)   OBESITY BEHAVIORAL INTERVENTION VISIT  Today's visit was #10  Starting weight: 220 lbs Starting date:  03/07/2018 Today's weight: 207 lbs  Today's date: 08/28/2018 Total lbs lost to date: 13   08/28/2018  Height 5\' 8"  (1.727 m)  Weight 207 lb (93.9 kg)  BMI (Calculated) 31.48  BLOOD PRESSURE - SYSTOLIC 073  BLOOD PRESSURE - DIASTOLIC 82   Body Fat % 71.0 %  Total Body Water (lbs) 87.6 lbs   ASK: We discussed the diagnosis of obesity with [name] today and Dariona agreed to give [name] permission to discuss obesity behavioral modification therapy today.  ASSESS: Vernesha has the diagnosis of obesity and her BMI today is 31.5. Emlyn is in the action stage of change.   ADVISE: Brissa was educated on the multiple health risks of obesity as well as the benefit of weight loss to improve her health. She was advised of the need for long term treatment and the importance of lifestyle modifications to improve her current health and to decrease her risk of future health problems.  AGREE: Multiple dietary modification options and treatment options were discussed and  [place] agreed to follow the recommendations documented in the above note.  ARRANGE: Leeandra was educated on the importance of frequent visits to treat obesity as outlined per CMS and USPSTF guidelines and agreed to schedule her next follow up appointment today.  I, Michaelene Song, am acting as Location manager for Dennard Nip, MD I have reviewed the above documentation for accuracy and completeness, and I agree with the above. -Dennard Nip, MD

## 2018-09-04 ENCOUNTER — Ambulatory Visit (INDEPENDENT_AMBULATORY_CARE_PROVIDER_SITE_OTHER): Payer: BC Managed Care – PPO | Admitting: Professional

## 2018-09-04 DIAGNOSIS — F331 Major depressive disorder, recurrent, moderate: Secondary | ICD-10-CM

## 2018-09-14 ENCOUNTER — Other Ambulatory Visit: Payer: Self-pay

## 2018-09-14 DIAGNOSIS — Z20822 Contact with and (suspected) exposure to covid-19: Secondary | ICD-10-CM

## 2018-09-15 ENCOUNTER — Other Ambulatory Visit (INDEPENDENT_AMBULATORY_CARE_PROVIDER_SITE_OTHER): Payer: Self-pay | Admitting: Family Medicine

## 2018-09-15 DIAGNOSIS — F3289 Other specified depressive episodes: Secondary | ICD-10-CM

## 2018-09-16 LAB — NOVEL CORONAVIRUS, NAA: SARS-CoV-2, NAA: NOT DETECTED

## 2018-09-17 ENCOUNTER — Other Ambulatory Visit (INDEPENDENT_AMBULATORY_CARE_PROVIDER_SITE_OTHER): Payer: Self-pay | Admitting: Family Medicine

## 2018-09-17 DIAGNOSIS — E559 Vitamin D deficiency, unspecified: Secondary | ICD-10-CM

## 2018-09-18 ENCOUNTER — Ambulatory Visit (INDEPENDENT_AMBULATORY_CARE_PROVIDER_SITE_OTHER): Payer: BC Managed Care – PPO | Admitting: Family Medicine

## 2018-09-18 ENCOUNTER — Encounter (INDEPENDENT_AMBULATORY_CARE_PROVIDER_SITE_OTHER): Payer: Self-pay | Admitting: Family Medicine

## 2018-09-18 ENCOUNTER — Other Ambulatory Visit: Payer: Self-pay

## 2018-09-18 VITALS — BP 137/90 | HR 98 | Temp 97.9°F | Ht 68.0 in | Wt 208.0 lb

## 2018-09-18 DIAGNOSIS — F3289 Other specified depressive episodes: Secondary | ICD-10-CM

## 2018-09-18 DIAGNOSIS — Z9189 Other specified personal risk factors, not elsewhere classified: Secondary | ICD-10-CM | POA: Diagnosis not present

## 2018-09-18 DIAGNOSIS — E559 Vitamin D deficiency, unspecified: Secondary | ICD-10-CM

## 2018-09-18 DIAGNOSIS — G4709 Other insomnia: Secondary | ICD-10-CM

## 2018-09-18 DIAGNOSIS — R7303 Prediabetes: Secondary | ICD-10-CM

## 2018-09-18 DIAGNOSIS — E669 Obesity, unspecified: Secondary | ICD-10-CM

## 2018-09-18 DIAGNOSIS — Z6831 Body mass index (BMI) 31.0-31.9, adult: Secondary | ICD-10-CM

## 2018-09-18 MED ORDER — TRAZODONE HCL 50 MG PO TABS: 25.0000 mg | ORAL_TABLET | Freq: Every evening | ORAL | 0 refills | Status: DC | PRN

## 2018-09-18 MED ORDER — METFORMIN HCL 500 MG PO TABS: 500.0000 mg | ORAL_TABLET | Freq: Every day | ORAL | 0 refills | Status: DC

## 2018-09-18 MED ORDER — TOPIRAMATE 25 MG PO TABS: 25.0000 mg | ORAL_TABLET | Freq: Every day | ORAL | 0 refills | Status: DC

## 2018-09-18 MED ORDER — VITAMIN D (ERGOCALCIFEROL) 1.25 MG (50000 UNIT) PO CAPS: 50000.0000 [IU] | ORAL_CAPSULE | ORAL | 0 refills | Status: DC

## 2018-09-19 ENCOUNTER — Ambulatory Visit (INDEPENDENT_AMBULATORY_CARE_PROVIDER_SITE_OTHER): Payer: BC Managed Care – PPO | Admitting: Professional

## 2018-09-19 DIAGNOSIS — F331 Major depressive disorder, recurrent, moderate: Secondary | ICD-10-CM

## 2018-09-19 NOTE — Progress Notes (Signed)
Office: [phone]  /  Fax: [phone]   HPI:   Chief Complaint: OBESITY Maureen [name] is here to discuss her progress with her obesity treatment plan. She is on the Category 2 plan and is following her eating plan approximately 75% of the time. She states she is doing sit ups/core exercises/biking 25 minutes 4 times per week. Maureen [name] continues to work on maintaining her weight. 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 She struggles with dinner meal planning.  Her weight is 208 lb (94.3 kg) today and has had a weight gain of 1 lb since her last visit. She has lost 12 lbs since starting treatment with [name].   Insomnia Geral started Trazodone and feels it is helping her sleep.  She denies daytime somnolence.   Vitamin D deficiency Maureen [name] has a diagnosis of Vitamin D deficiency. She is currently stable on prescription Vit D and denies nausea, vomiting or muscle weakness.  At risk for osteopenia and osteoporosis Maureen [name] is at higher risk of osteopenia and osteoporosis due to Vitamin D deficiency.   Depression with emotional eating behaviors Maureen [name] is struggling with emotional eating and using food for comfort to the extent that it is negatively impacting her health. She often snacks when she is not hungry. Maureen [name] sometimes feels she is out of control and then feels guilty that she made poor food choices. She has been working on behavior modification techniques to help reduce her emotional eating and has been somewhat successful. Maureen [name] is struggling with emotional eating, worse with increased stress. She is using food for comfort but is trying to portion control.  She shows no sign of suicidal or homicidal ideations.  Depression screen PHQ 2/9 03/07/2018  Decreased Interest 2  Down, Depressed, Hopeless 2  PHQ - 2 Score 4  Altered sleeping 3  Tired, decreased energy 3  Change in appetite 3  Feeling bad or failure about yourself  1  Trouble concentrating 1   Moving slowly or fidgety/restless 0  Suicidal thoughts 0  PHQ-9 Score 15  Difficult doing work/chores Somewhat difficult   Pre-Diabetes Maureen [name] has a diagnosis of prediabetes based on her elevated Hgb A1c and was informed this puts her at greater risk of developing diabetes. She is stable on metformin currently and continues to work on diet and exercise to decrease risk of diabetes. She denies nausea, vomiting, or hypoglycemia.  ASSESSMENT AND PLAN:  Other insomnia - Plan: traZODone (DESYREL) 50 MG tablet  Vitamin D deficiency - Plan: Vitamin D, Ergocalciferol, (DRISDOL) 1.25 MG (50000 UT) CAPS capsule  Prediabetes - Plan: metFORMIN (GLUCOPHAGE) 500 MG tablet  Other depression - with emotional eating - Plan: topiramate (TOPAMAX) 25 MG tablet  At risk for osteoporosis  Class 1 obesity with serious comorbidity and body mass index (BMI) of 31.0 to 31.9 in adult, unspecified obesity type  PLAN:  Insomnia Maureen [name] was given a refill on her Trazodone 25-50 mg #30 with 0 refills. She agrees to follow-up with our clinic in 2-3 weeks.  Vitamin D Deficiency Maureen [name] was informed that low Vitamin D levels contributes to fatigue and are associated with obesity, breast, and colon cancer. She agrees to continue to take prescription Vit D @ 50,000 IU every week #4 with 0 refills and will follow-up for routine testing of Vitamin D, at least 2-3 times per year. She was informed of the risk of over-replacement of Vitamin D and agrees to not increase her dose unless she discusses this with [name] first. [name]  agrees to follow-up with our clinic in 2-3 weeks.  At risk for osteopenia and osteoporosis Maureen [name] was given extended  (15 minutes) osteoporosis prevention counseling today. Maureen [name] is at risk for osteopenia and osteoporosis due to her Vitamin D deficiency. She was encouraged to take her Vitamin D and follow her higher calcium diet and increase strengthening exercise to help strengthen her bones and  decrease her risk of osteopenia and osteoporosis.  Depression with Emotional Eating Behaviors We discussed behavior modification techniques today to help Maureen [name] deal with her emotional eating and depression. Maureen [name] was given a refill on her Topamax 25 mg #30 with 0 refills and she agrees to follow-up with our clinic in 2-3 weeks.  Pre-Diabetes Maureen [name] will continue to work on weight loss, exercise, and decreasing simple carbohydrates in her diet to help decrease the risk of diabetes. We dicussed metformin including benefits and risks. She was informed that eating too many simple carbohydrates or too many calories at one sitting increases the likelihood of GI side effects. Maureen [name] was given a refill on her metformin 500 mg #30 with 0 refills and she agrees to follow-up with our clinic in 2-3 weeks.  Obesity Maureen [name] is currently in the action stage of change. As such, her goal is to continue with weight loss efforts. She has agreed to follow the Category 2 plan. Maureen [name] has been instructed to work up to a goal of 150 minutes of combined cardio and strengthening exercise per week for weight loss and overall health benefits. We discussed the following Behavioral Modification Strategies today: increasing lean protein intake, decreasing simple carbohydrates, and emotional eating strategies.  Maureen [name] has agreed to follow-up with our clinic in 2-3 weeks. She was informed of the importance of frequent follow-up visits to maximize her success with intensive lifestyle modifications for her multiple health conditions.  ALLERGIES: Allergies  Allergen Reactions  . Lactose Intolerance (Gi)   . Vicodin [Hydrocodone-Acetaminophen] Nausea And Vomiting    MEDICATIONS: Current Outpatient Medications on File Prior to Visit  Medication Sig Dispense Refill  . amLODipine (NORVASC) 2.5 MG tablet Take 2.5 mg by mouth daily.    . cyclobenzaprine (FLEXERIL) 10 MG tablet Take 10 mg by mouth 3 (three) times daily as  needed for muscle spasms.    Marland Kitchen. levothyroxine (SYNTHROID, LEVOTHROID) 100 MCG tablet Take 100 mcg by mouth daily before breakfast.    . LORazepam (ATIVAN) 0.5 MG tablet Take 0.5 mg by mouth at bedtime.    . norethindrone-ethinyl estradiol (JUNEL FE,GILDESS FE,LOESTRIN FE) 1-20 MG-MCG tablet Take 1 tablet by mouth daily.     No current facility-administered medications on file prior to visit.     PAST MEDICAL HISTORY: Past Medical History:  Diagnosis Date  . Abdominal pain   . Anxiety   . Back pain   . Constipation   . Depression   . Dyspnea   . GERD (gastroesophageal reflux disease)   . HTN (hypertension)   . Hypothyroidism   . Lactose intolerance   . Vaginal bleeding, abnormal     PAST SURGICAL HISTORY: Past Surgical History:  Procedure Laterality Date  . CERCLAGE LAPAROSCOPIC ABDOMINAL     [phone],2001,2004    SOCIAL HISTORY: Social History   Tobacco Use  . Smoking status: Never Smoker  . Smokeless tobacco: Never Used  Substance Use Topics  . Alcohol use: Not on file  . Drug use: Not on file    FAMILY HISTORY: Family History  Problem Relation Age of Onset  . Hypertension Mother   .  Thyroid disease Mother   . Depression Mother   . Anxiety disorder Mother    ROS: Review of Systems  Gastrointestinal: Negative for nausea and vomiting.  Musculoskeletal:       Negative for muscle weakness.  Endo/Heme/Allergies:       Negative for hypoglycemia.  Psychiatric/Behavioral: Positive for depression. Negative for suicidal ideas. The patient has insomnia.        Negative for homicidal ideas.   PHYSICAL EXAM: Blood pressure 137/90, pulse 98, temperature 97.9 F (36.6 C), temperature source Oral, height 5\' 8"  (1.727 m), weight 208 lb (94.3 kg), SpO2 99 %. Body mass index is 31.63 kg/m. Physical Exam Vitals signs reviewed.  Constitutional:      Appearance: Normal appearance. She is obese.  Cardiovascular:     Rate and Rhythm: Normal rate.     Pulses: Normal pulses.   Pulmonary:     Effort: Pulmonary effort is normal.     Breath sounds: Normal breath sounds.  Musculoskeletal: Normal range of motion.  Skin:    General: Skin is warm and dry.  Neurological:     Mental Status: She is alert and oriented to person, place, and time.  Psychiatric:        Behavior: Behavior normal.   RECENT LABS AND TESTS: BMET    Component Value Date/Time   NA 138 03/07/2018 1032   K 4.6 03/07/2018 1032   CL 102 03/07/2018 1032   CO2 21 03/07/2018 1032   GLUCOSE 81 03/07/2018 1032   BUN 11 03/07/2018 1032   CREATININE 0.91 03/07/2018 1032   CALCIUM 9.3 03/07/2018 1032   GFRNONAA 74 03/07/2018 1032   GFRAA 85 03/07/2018 1032   Lab Results  Component Value Date   HGBA1C 5.3 03/07/2018   Lab Results  Component Value Date   INSULIN 16.5 03/07/2018   CBC    Component Value Date/Time   WBC 8.3 03/07/2018 1032   RBC 4.86 03/07/2018 1032   HGB 13.4 03/07/2018 1032   HCT 41.0 03/07/2018 1032   MCV 84 03/07/2018 1032   MCH 27.6 03/07/2018 1032   MCHC 32.7 03/07/2018 1032   RDW 13.2 03/07/2018 1032   LYMPHSABS 2.2 03/07/2018 1032   EOSABS 0.2 03/07/2018 1032   BASOSABS 0.1 03/07/2018 1032   Iron/TIBC/Ferritin/ %Sat No results found for: IRON, TIBC, FERRITIN, IRONPCTSAT Lipid Panel     Component Value Date/Time   CHOL 197 03/07/2018 1032   TRIG 93 03/07/2018 1032   HDL 61 03/07/2018 1032   LDLCALC 117 (H) 03/07/2018 1032   Hepatic Function Panel     Component Value Date/Time   PROT 6.8 03/07/2018 1032   ALBUMIN 4.3 03/07/2018 1032   AST 16 03/07/2018 1032   ALT 10 03/07/2018 1032   ALKPHOS 69 03/07/2018 1032   BILITOT 0.3 03/07/2018 1032      Component Value Date/Time   TSH 0.528 03/07/2018 1032   Results for RYLYN, TWOMEY (MRN [id number]) as of 09/19/2018 08:20  Ref. Range 03/07/2018 10:32  Vitamin D, 25-Hydroxy Latest Ref Range: 30.0 - 100.0 ng/mL 12.6 (L)   OBESITY BEHAVIORAL INTERVENTION VISIT  Today's visit was #11  Starting  weight: 220 lbs Starting date: 03/07/2018 Today's weight: 208 lbs  Today's date: 09/18/2018 Total lbs lost to date: 12    09/18/2018  Height 5\' 8"  (1.727 m)  Weight 208 lb (94.3 kg)  BMI (Calculated) 31.63  BLOOD PRESSURE - SYSTOLIC 137  BLOOD PRESSURE - DIASTOLIC 90   Body Fat % 38.7 %  Total Body Water (lbs) 84 lbs   ASK: We discussed the diagnosis of obesity with Maureen [name] today and Maureen [name] agreed to give Maureen [name] permission to discuss obesity behavioral modification therapy today.  ASSESS: Maureen [name] has the diagnosis of obesity and her BMI today is 31.6. Maureen [name] is in the action stage of change.   ADVISE: Maureen [name] was educated on the multiple health risks of obesity as well as the benefit of weight loss to improve her health. She was advised of the need for long term treatment and the importance of lifestyle modifications to improve her current health and to decrease her risk of future health problems.  AGREE: Multiple dietary modification options and treatment options were discussed and  Maureen [name] agreed to follow the recommendations documented in the above note.  ARRANGE: Maureen [name] was educated on the importance of frequent visits to treat obesity as outlined per CMS and USPSTF guidelines and agreed to schedule her next follow up appointment today.  I, Marianna Paymentenise Haag, am acting as Energy managertranscriptionist for Quillian Quincearen Clemens Lachman, MD  I have reviewed the above documentation for accuracy and completeness, and I agree with the above. -Quillian Quincearen Ambry Dix, MD

## 2018-10-03 ENCOUNTER — Ambulatory Visit (INDEPENDENT_AMBULATORY_CARE_PROVIDER_SITE_OTHER): Payer: BC Managed Care – PPO | Admitting: Professional

## 2018-10-03 DIAGNOSIS — F331 Major depressive disorder, recurrent, moderate: Secondary | ICD-10-CM | POA: Diagnosis not present

## 2018-10-08 ENCOUNTER — Other Ambulatory Visit: Payer: Self-pay

## 2018-10-08 ENCOUNTER — Ambulatory Visit (INDEPENDENT_AMBULATORY_CARE_PROVIDER_SITE_OTHER): Payer: BC Managed Care – PPO | Admitting: Family Medicine

## 2018-10-08 ENCOUNTER — Encounter (INDEPENDENT_AMBULATORY_CARE_PROVIDER_SITE_OTHER): Payer: Self-pay | Admitting: Family Medicine

## 2018-10-08 VITALS — BP 119/83 | HR 74 | Temp 98.0°F | Ht 68.0 in | Wt 206.0 lb

## 2018-10-08 DIAGNOSIS — F3289 Other specified depressive episodes: Secondary | ICD-10-CM

## 2018-10-08 DIAGNOSIS — Z9189 Other specified personal risk factors, not elsewhere classified: Secondary | ICD-10-CM

## 2018-10-08 DIAGNOSIS — R7303 Prediabetes: Secondary | ICD-10-CM | POA: Diagnosis not present

## 2018-10-08 DIAGNOSIS — E559 Vitamin D deficiency, unspecified: Secondary | ICD-10-CM

## 2018-10-08 DIAGNOSIS — Z6831 Body mass index (BMI) 31.0-31.9, adult: Secondary | ICD-10-CM

## 2018-10-08 DIAGNOSIS — G4709 Other insomnia: Secondary | ICD-10-CM | POA: Diagnosis not present

## 2018-10-08 DIAGNOSIS — E669 Obesity, unspecified: Secondary | ICD-10-CM

## 2018-10-08 MED ORDER — TOPIRAMATE 25 MG PO TABS: 25.0000 mg | ORAL_TABLET | Freq: Every day | ORAL | 0 refills | Status: DC

## 2018-10-08 MED ORDER — VITAMIN D (ERGOCALCIFEROL) 1.25 MG (50000 UNIT) PO CAPS: 50000.0000 [IU] | ORAL_CAPSULE | ORAL | 0 refills | Status: DC

## 2018-10-08 MED ORDER — METFORMIN HCL 500 MG PO TABS: 500.0000 mg | ORAL_TABLET | Freq: Every day | ORAL | 0 refills | Status: DC

## 2018-10-08 MED ORDER — TRAZODONE HCL 50 MG PO TABS: 25.0000 mg | ORAL_TABLET | Freq: Every evening | ORAL | 0 refills | Status: DC | PRN

## 2018-10-09 NOTE — Progress Notes (Signed)
Office: [phone]  /  Fax: [phone]   HPI:   Chief Complaint: OBESITY Maureen [name] [name] is here to discuss her progress with her obesity treatment plan. She is on the Category 2 plan and is following her [name] plan approximately 80 % of the time. She states she is walking for 60 minutes 2-3 times per week. Maureen [name] [name] continues to do well with weight loss on her Category 2 plan. She is deviating more from her plan at dinner time.  Her weight is 206 lb (93.4 kg) today and has had a weight loss of 2 pounds over a period of 3 weeks since her last visit. She has lost 14 lbs since starting treatment with [name].  Maureen [name] [name] has a diagnosis of Maureen [name] based on her elevated Hgb A1c and was informed this puts her at greater risk of developing diabetes. She is stable on metformin and notes decreased polyphagia. She denies nausea, vomiting, or hypoglycemia. She continues to work on diet and exercise to decrease risk of diabetes.  At risk for diabetes Maureen [name] [name] is at higher than average risk for developing diabetes due to her obesity and Maureen [name]. She currently denies polyuria or polydipsia.  Vitamin D Deficiency Maureen [name] [name] has a diagnosis of vitamin D deficiency. She is stable on prescription Vit D and denies nausea, vomiting or muscle weakness.  Insomnia Maureen [name] [name] complains of insomnia. She is stable on Trazodone, and feels it helps with sleep. She denies daytime somnolence.   Depression with Emotional [name] Behaviors Maureen [name] [name] is still struggling with emotional [name]. She has been inconsistent with taking topiramate, but thinks her routine will make this easier now. She is using food for comfort to the extent that it is negatively impacting her health. She often snacks when she is not hungry. Maureen [name] [name] sometimes feels she is out of control and then feels guilty that she made poor food choices. She has been working on behavior modification techniques to help reduce her emotional [name] and has been  somewhat successful. She shows no sign of suicidal or homicidal ideations.  Depression screen PHQ 2/9 03/07/2018  Decreased Interest 2  Down, Depressed, Hopeless 2  PHQ - 2 Score 4  Altered sleeping 3  Tired, decreased energy 3  Change in appetite 3  Feeling bad or failure about yourself  1  Trouble concentrating 1  Moving slowly or fidgety/restless 0  Suicidal thoughts 0  PHQ-9 Score 15  Difficult doing work/chores Somewhat difficult    ASSESSMENT AND PLAN:  Prediabetes - Plan: metFORMIN (GLUCOPHAGE) 500 MG tablet  Vitamin D deficiency - Plan: Vitamin D, Ergocalciferol, (DRISDOL) 1.25 MG (50000 UT) CAPS capsule  Other insomnia - Plan: traZODone (DESYREL) 50 MG tablet  Other depression - with emotional [name] - Plan: topiramate (TOPAMAX) 25 MG tablet  At risk for diabetes mellitus  Class 1 obesity with serious comorbidity and body mass index (BMI) of 31.0 to 31.9 in adult, unspecified obesity type  PLAN:  Maureen [name] Maureen [name] [name] will continue to work on weight loss, exercise, and decreasing simple carbohydrates in her diet to help decrease the risk of diabetes. We dicussed metformin including benefits and risks. She was informed that [name] too many simple carbohydrates or too many calories at one sitting increases the likelihood of GI side effects. Maureen [name] agrees to continue taking metformin 500 mg q AM #30 and we will refill for 1 month. Maureen [name] [name] agrees to follow up with our clinic in 2 weeks as directed to monitor her progress.  Diabetes risk counseling Maureen [name] was given extended (15  minutes) diabetes prevention counseling today. She is 48 y.o. female and has risk factors for diabetes including obesity and Maureen [name]. We discussed intensive lifestyle modifications today with an emphasis on weight loss as well as increasing exercise and decreasing simple carbohydrates in her diet.  Vitamin D Deficiency Maureen [name] [name] was informed that low vitamin D levels contributes to fatigue and are  associated with obesity, breast, and colon cancer. Maureen [name] agrees to continue taking prescription Vit D 50,000 IU every week #4 and we will refill for 1 month. She will follow up for routine testing of vitamin D, at least 2-3 times per year. She was informed of the risk of over-replacement of vitamin D and agrees to not increase her dose unless she discusses this with [name] first. Maureen [name] [name] agrees to follow up with our clinic in 2 weeks.  Insomnia The problem of recurrent insomnia was discussed. Avoidance of caffeine sources was strongly encouraged and sleep hygiene issues were reviewed. Maureen [name] [name] agrees to continue taking Trazodone 50 mg qhs #30 and we will refill for 1 month. Maureen [name] agrees to follow up with our clinic in 2 weeks.  Depression with Emotional [name] Behaviors We discussed behavior modification techniques today to help Maureen [name] [name] and depression. Maureen [name] [name] agrees to continue taking topiramate 25 mg q daily #30 and we will refill for 1 month. Maureen [name] [name] agrees to follow up with our clinic in 2 weeks.  Obesity Maureen [name] [name] is currently in the action stage of change. As such, her goal is to continue with weight loss efforts She has agreed to keep a food journal with 400-550 calories and 40+ grams of protein at supper daily and follow the Category 2 plan Maureen [name] [name] has been instructed to work up to a goal of 150 minutes of combined cardio and strengthening exercise per week for weight loss and overall health benefits. We discussed the following Behavioral Modification Strategies today: increasing lean protein intake, better snacking choices, and emotional [name] strategies High protein recipes given.  Maureen [name] [name] has agreed to follow up with our clinic in 2 weeks. She was informed of the importance of frequent follow up visits to maximize her success with intensive lifestyle modifications for her multiple health conditions.  ALLERGIES: Allergies  Allergen Reactions   Lactose Intolerance  (Gi)    Vicodin [Hydrocodone-Acetaminophen] Nausea And Vomiting    MEDICATIONS: Current Outpatient Medications on File Prior to Visit  Medication Sig Dispense Refill   cyclobenzaprine (FLEXERIL) 10 MG tablet Take 10 mg by mouth 3 (three) times daily as needed for muscle spasms.     levothyroxine (SYNTHROID, LEVOTHROID) 100 MCG tablet Take 100 mcg by mouth daily before breakfast.     LORazepam (ATIVAN) 0.5 MG tablet Take 0.5 mg by mouth at bedtime.     norethindrone-ethinyl estradiol (JUNEL FE,GILDESS FE,LOESTRIN FE) 1-20 MG-MCG tablet Take 1 tablet by mouth daily.     No current facility-administered medications on file prior to visit.     PAST MEDICAL HISTORY: Past Medical History:  Diagnosis Date   Abdominal pain    Anxiety    Back pain    Constipation    Depression    Dyspnea    GERD (gastroesophageal reflux disease)    HTN (hypertension)    Hypothyroidism    Lactose intolerance    Vaginal bleeding, abnormal     PAST SURGICAL HISTORY: Past Surgical History:  Procedure Laterality Date   CERCLAGE LAPAROSCOPIC ABDOMINAL     [phone]    SOCIAL HISTORY: Social History   Tobacco  Use   Smoking status: Never Smoker   Smokeless tobacco: Never Used  Substance Use Topics   Alcohol use: Not on file   Drug use: Not on file    FAMILY HISTORY: Family History  Problem Relation Age of Onset   Hypertension Mother    Thyroid disease Mother    Depression Mother    Anxiety disorder Mother     ROS: Review of Systems  Constitutional: Positive for weight loss.  Gastrointestinal: Negative for nausea and vomiting.  Genitourinary: Negative for frequency.  Musculoskeletal:       Negative muscle weakness  Endo/Heme/Allergies: Negative for polydipsia.       Negative hypoglycemia  Psychiatric/Behavioral: Positive for depression. Negative for suicidal ideas. The patient has insomnia.     PHYSICAL EXAM: Blood pressure 119/83, pulse 74,  temperature 98 F (36.7 C), temperature source Oral, height 5\' 8"  (1.727 m), weight 206 lb (93.4 kg), SpO2 99 %. Body mass index is 31.32 kg/m. Physical Exam Vitals signs reviewed.  Constitutional:      Appearance: Normal appearance. She is obese.  Cardiovascular:     Rate and Rhythm: Normal rate.     Pulses: Normal pulses.  Pulmonary:     Effort: Pulmonary effort is normal.     Breath sounds: Normal breath sounds.  Musculoskeletal: Normal range of motion.  Skin:    General: Skin is warm and dry.  Neurological:     Mental Status: She is alert and oriented to person, place, and time.  Psychiatric:        Mood and Affect: Mood normal.        Behavior: Behavior normal.     RECENT LABS AND TESTS: BMET    Component Value Date/Time   NA 138 03/07/2018 1032   K 4.6 03/07/2018 1032   CL 102 03/07/2018 1032   CO2 21 03/07/2018 1032   GLUCOSE 81 03/07/2018 1032   BUN 11 03/07/2018 1032   CREATININE 0.91 03/07/2018 1032   CALCIUM 9.3 03/07/2018 1032   GFRNONAA 74 03/07/2018 1032   GFRAA 85 03/07/2018 1032   Lab Results  Component Value Date   HGBA1C 5.3 03/07/2018   Lab Results  Component Value Date   INSULIN 16.5 03/07/2018   CBC    Component Value Date/Time   WBC 8.3 03/07/2018 1032   RBC 4.86 03/07/2018 1032   HGB 13.4 03/07/2018 1032   HCT 41.0 03/07/2018 1032   MCV 84 03/07/2018 1032   MCH 27.6 03/07/2018 1032   MCHC 32.7 03/07/2018 1032   RDW 13.2 03/07/2018 1032   LYMPHSABS 2.2 03/07/2018 1032   EOSABS 0.2 03/07/2018 1032   BASOSABS 0.1 03/07/2018 1032   Iron/TIBC/Ferritin/ %Sat No results found for: IRON, TIBC, FERRITIN, IRONPCTSAT Lipid Panel     Component Value Date/Time   CHOL 197 03/07/2018 1032   TRIG 93 03/07/2018 1032   HDL 61 03/07/2018 1032   LDLCALC 117 (H) 03/07/2018 1032   Hepatic Function Panel     Component Value Date/Time   PROT 6.8 03/07/2018 1032   ALBUMIN 4.3 03/07/2018 1032   AST 16 03/07/2018 1032   ALT 10 03/07/2018 1032    ALKPHOS 69 03/07/2018 1032   BILITOT 0.3 03/07/2018 1032      Component Value Date/Time   TSH 0.528 03/07/2018 1032      OBESITY BEHAVIORAL INTERVENTION VISIT  Today's visit was # 12   Starting weight: 220 lbs Starting date: 03/07/2018 Today's weight : 206 lbs Today's date: 10/08/2018 Total  lbs lost to date: 214    ASK: We discussed the diagnosis of obesity with Thurnell LoseJeneen V Everard today and Erian agreed to give us permission to discuss obesity behavioral modification therapy today.  ASSESS: [name] has the diagnosis of obesity and her BMI today is 31.33 [name] is in the action stage of change   ADVISE: [name] was educated on the multiple health risks of obesity as well as the benefit of weight loss to improve her health. She was advised of the need for long term treatment and the importance of lifestyle modifications to improve her current health and to decrease her risk of future health problems.  AGREE: Multiple dietary modification options and treatment options were discussed and  Cearra agreed to follow the recommendations documented in the above note.  ARRANGE: [name] was educated on the importance of frequent visits to treat obesity as outlined per CMS and USPSTF guidelines and agreed to schedule her next follow up appointment today.  I, Burt KnackSharon Martin, am acting as transcriptionist for Quillian Quincearen Stanislawa Gaffin, MD  I have reviewed the above documentation for accuracy and completeness, and I agree with the above. -Quillian Quincearen Cherlynn Popiel, MD

## 2018-10-17 ENCOUNTER — Ambulatory Visit (INDEPENDENT_AMBULATORY_CARE_PROVIDER_SITE_OTHER): Payer: BC Managed Care – PPO | Admitting: Professional

## 2018-10-17 DIAGNOSIS — F331 Major depressive disorder, recurrent, moderate: Secondary | ICD-10-CM | POA: Diagnosis not present

## 2018-10-30 ENCOUNTER — Encounter (INDEPENDENT_AMBULATORY_CARE_PROVIDER_SITE_OTHER): Payer: Self-pay | Admitting: Family Medicine

## 2018-10-30 ENCOUNTER — Other Ambulatory Visit: Payer: Self-pay

## 2018-10-30 ENCOUNTER — Ambulatory Visit (INDEPENDENT_AMBULATORY_CARE_PROVIDER_SITE_OTHER): Payer: BC Managed Care – PPO | Admitting: Family Medicine

## 2018-10-30 VITALS — BP 133/82 | HR 95 | Temp 98.0°F | Ht 68.0 in | Wt 198.0 lb

## 2018-10-30 DIAGNOSIS — G4709 Other insomnia: Secondary | ICD-10-CM | POA: Diagnosis not present

## 2018-10-30 DIAGNOSIS — R7303 Prediabetes: Secondary | ICD-10-CM

## 2018-10-30 DIAGNOSIS — F3289 Other specified depressive episodes: Secondary | ICD-10-CM

## 2018-10-30 DIAGNOSIS — Z9189 Other specified personal risk factors, not elsewhere classified: Secondary | ICD-10-CM

## 2018-10-30 DIAGNOSIS — E559 Vitamin D deficiency, unspecified: Secondary | ICD-10-CM

## 2018-10-30 DIAGNOSIS — E669 Obesity, unspecified: Secondary | ICD-10-CM

## 2018-10-30 DIAGNOSIS — Z6830 Body mass index (BMI) 30.0-30.9, adult: Secondary | ICD-10-CM

## 2018-10-30 MED ORDER — VITAMIN D (ERGOCALCIFEROL) 1.25 MG (50000 UNIT) PO CAPS: 50000.0000 [IU] | ORAL_CAPSULE | ORAL | 0 refills | Status: DC

## 2018-10-30 MED ORDER — METFORMIN HCL 500 MG PO TABS: 500.0000 mg | ORAL_TABLET | Freq: Every day | ORAL | 0 refills | Status: DC

## 2018-10-30 MED ORDER — TRAZODONE HCL 50 MG PO TABS: 25.0000 mg | ORAL_TABLET | Freq: Every evening | ORAL | 0 refills | Status: DC | PRN

## 2018-10-30 MED ORDER — TOPIRAMATE 25 MG PO TABS: 25.0000 mg | ORAL_TABLET | Freq: Every day | ORAL | 0 refills | Status: DC

## 2018-10-31 ENCOUNTER — Ambulatory Visit: Payer: BC Managed Care – PPO | Admitting: Professional

## 2018-11-01 ENCOUNTER — Ambulatory Visit: Payer: BC Managed Care – PPO | Admitting: Professional

## 2018-11-01 NOTE — Progress Notes (Signed)
Office: [phone]  /  Fax: [phone]   HPI:   Chief Complaint: OBESITY Maureen [name] is here to discuss her progress with her obesity treatment plan. She is on the keep a food journal with 400-550 calories and 40+ grams of protein at supper daily and follow the Category 2 plan and is following her eating plan approximately 80 % of the time. She states she is walking for 40-45 minutes 3 times per week. Maureen [name] was given the option to journal and she is doing this on paper, and not really counting the calories or protein. She has been skipping meals and not eating al of her vegetables or protein.  Her weight is 198 lb (89.8 kg) today and has had a weight loss of 8 pounds over a period of 3 weeks since her last visit. She has lost 22 lbs since starting treatment with us.  Pre-Diabetes Maureen [name] has a diagnosis of pre-diabetes based on her elevated Hgb A1c and was informed this puts her at greater risk of developing diabetes. She is taking metformin currently and continues to work on diet and weight loss, and is trying to decrease simple carbohydrates to decrease risk of diabetes. She denies nausea, vomiting, or hypoglycemia.  Vitamin D Deficiency Maureen [name] has a diagnosis of vitamin D deficiency. She is stable on prescription Vit D, but level is not yet at goal. She denies nausea, vomiting or muscle weakness.  At risk for osteopenia and osteoporosis Maureen [name] is at higher risk of osteopenia and osteoporosis due to vitamin D deficiency.   Insomnia Maureen [name] complains of insomnia. Se is taking trazodone most nights and feels it is helping her sleep. She denies daytime somnolence.   Depression with Emotional Eating Behaviors Maureen [name]'s mood is stable on Topamax and she denies daytime somnolence. Maureen [name] struggles with emotional eating and using food for comfort to the extent that it is negatively impacting her health. She often snacks when she is not hungry. Maureen [name] sometimes feels she is out of control and then  feels guilty that she made poor food choices. She has been working on behavior modification techniques to help reduce her emotional eating and has been somewhat successful. She shows no sign of suicidal or homicidal ideations.  Depression screen PHQ 2/9 03/07/2018  Decreased Interest 2  Down, Depressed, Hopeless 2  PHQ - 2 Score 4  Altered sleeping 3  Tired, decreased energy 3  Change in appetite 3  Feeling bad or failure about yourself  1  Trouble concentrating 1  Moving slowly or fidgety/restless 0  Suicidal thoughts 0  PHQ-9 Score 15  Difficult doing work/chores Somewhat difficult    ASSESSMENT AND PLAN:  Prediabetes - Plan: metFORMIN (GLUCOPHAGE) 500 MG tablet  Vitamin D deficiency - Plan: Vitamin D, Ergocalciferol, (DRISDOL) 1.25 MG (50000 UT) CAPS capsule  Other insomnia - Plan: traZODone (DESYREL) 50 MG tablet  Other depression - with emotional eating - Plan: topiramate (TOPAMAX) 25 MG tablet  At risk for osteoporosis  Class 1 obesity with serious comorbidity and body mass index (BMI) of 30.0 to 30.9 in adult, unspecified obesity type  PLAN:  Pre-Diabetes Maureen [name] will continue to work on weight loss, exercise, and decreasing simple carbohydrates in her diet to help decrease the risk of diabetes. We dicussed metformin including benefits and risks. She was informed that eating too many simple carbohydrates or too many calories at one sitting increases the likelihood of GI side effects. Maureen [name] agrees to continue taking metformin 500 mg PO q AM #30 and we  will refill for 1 month. Daniel agrees to follow up with our clinic in 3 weeks as directed to monitor her progress.  Vitamin D Deficiency Maureen [name] was informed that low vitamin D levels contributes to fatigue and are associated with obesity, breast, and colon cancer. Maureen [name] agrees to continue taking prescription Vit D 50,000 IU every week #4 and we will refill for 1 month. She will follow up for routine testing of vitamin D, at  least 2-3 times per year. She was informed of the risk of over-replacement of vitamin D and agrees to not increase her dose unless she discusses this with [name] first. Maureen [name] agrees to follow up with our clinic in 3 weeks.  At risk for osteopenia and osteoporosis Maureen [name] was given extended (15 minutes) osteoporosis prevention counseling today. Maureen [name] is at risk for osteopenia and osteoporsis due to her vitamin D deficiency. She was encouraged to take her vitamin D and follow her higher calcium diet and increase strengthening exercise to help strengthen her bones and decrease her risk of osteopenia and osteoporosis.  Insomnia The problem of recurrent insomnia was discussed. Avoidance of caffeine sources was strongly encouraged and sleep hygiene issues were reviewed. Maureen [name] agrees to continue taking trazodone 25-50 mg 0.5-1 tablet PO qhs as needed for sleep #30 and we will refill for 1 month. Maureen [name] agrees to follow up with our clinic in 3 weeks.  Depression with Emotional Eating Behaviors We discussed behavior modification techniques today to help Maureen [name] deal with her emotional eating and depression. Maureen [name] agrees to continue taking Topamax 25 mg PO q daily #30 and we will refill for 1 month. Maureen [name] agrees to follow up with our clinic in 3 weeks.  Obesity Maureen [name] is currently in the action stage of change. As such, her goal is to continue with weight loss efforts She has agreed to keep a food journal with 400-550 calories and 40+ grams of protein at supper daily and follow the Category 2 plan Maureen [name] has been instructed to work up to a goal of 150 minutes of combined cardio and strengthening exercise per week for weight loss and overall health benefits. We discussed the following Behavioral Modification Strategies today: increasing lean protein intake, decreasing simple carbohydrates  and work on meal planning and easy cooking plans   Maureen [name] has agreed to follow up with our clinic in 3 weeks. She was  informed of the importance of frequent follow up visits to maximize her success with intensive lifestyle modifications for her multiple health conditions.  ALLERGIES: Allergies  Allergen Reactions   Lactose Intolerance (Gi)    Vicodin [Hydrocodone-Acetaminophen] Nausea And Vomiting    MEDICATIONS: Current Outpatient Medications on File Prior to Visit  Medication Sig Dispense Refill   cyclobenzaprine (FLEXERIL) 10 MG tablet Take 10 mg by mouth 3 (three) times daily as needed for muscle spasms.     levothyroxine (SYNTHROID, LEVOTHROID) 100 MCG tablet Take 100 mcg by mouth daily before breakfast.     LORazepam (ATIVAN) 0.5 MG tablet Take 0.5 mg by mouth at bedtime.     norethindrone-ethinyl estradiol (JUNEL FE,GILDESS FE,LOESTRIN FE) 1-20 MG-MCG tablet Take 1 tablet by mouth daily.     No current facility-administered medications on file prior to visit.     PAST MEDICAL HISTORY: Past Medical History:  Diagnosis Date   Abdominal pain    Anxiety    Back pain    Constipation    Depression    Dyspnea    GERD (gastroesophageal reflux disease)  HTN (hypertension)    Hypothyroidism    Lactose intolerance    Vaginal bleeding, abnormal     PAST SURGICAL HISTORY: Past Surgical History:  Procedure Laterality Date   CERCLAGE LAPAROSCOPIC ABDOMINAL     [phone]    SOCIAL HISTORY: Social History   Tobacco Use   Smoking status: Never Smoker   Smokeless tobacco: Never Used  Substance Use Topics   Alcohol use: Not on file   Drug use: Not on file    FAMILY HISTORY: Family History  Problem Relation Age of Onset   Hypertension Mother    Thyroid disease Mother    Depression Mother    Anxiety disorder Mother     ROS: Review of Systems  Constitutional: Positive for weight loss.  Gastrointestinal: Negative for nausea and vomiting.  Musculoskeletal:       Negative muscle weakness  Endo/Heme/Allergies:       Negative hypoglycemia    Psychiatric/Behavioral: Positive for depression. Negative for suicidal ideas. The patient has insomnia.     PHYSICAL EXAM: Blood pressure 133/82, pulse 95, temperature 98 F (36.7 C), temperature source Oral, height  (1.727 m), weight 198 lb (89.8 kg), SpO2 100 %. Body mass index is 30.11 kg/m. Physical Exam Vitals signs reviewed.  Constitutional:      Appearance: Normal appearance. She is obese.  Cardiovascular:     Rate and Rhythm: Normal rate.     Pulses: Normal pulses.  Pulmonary:     Effort: Pulmonary effort is normal.     Breath sounds: Normal breath sounds.  Musculoskeletal: Normal range of motion.  Skin:    General: Skin is warm and dry.  Neurological:     Mental Status: She is alert and oriented to person, place, and time.  Psychiatric:        Mood and Affect: Mood normal.        Behavior: Behavior normal.     RECENT LABS AND TESTS: BMET    Component Value Date/Time   NA 138 03/07/2018 1032   K 4.6 03/07/2018 1032   CL 102 03/07/2018 1032   CO2 21 03/07/2018 1032   GLUCOSE 81 03/07/2018 1032   BUN 11 03/07/2018 1032   CREATININE 0.91 03/07/2018 1032   CALCIUM 9.3 03/07/2018 1032   GFRNONAA 74 03/07/2018 1032   GFRAA 85 03/07/2018 1032   Lab Results  Component Value Date   HGBA1C 5.3 03/07/2018   Lab Results  Component Value Date   INSULIN 16.5 03/07/2018   CBC    Component Value Date/Time   WBC 8.3 03/07/2018 1032   RBC 4.86 03/07/2018 1032   HGB 13.4 03/07/2018 1032   HCT 41.0 03/07/2018 1032   MCV 84 03/07/2018 1032   MCH 27.6 03/07/2018 1032   MCHC 32.7 03/07/2018 1032   RDW 13.2 03/07/2018 1032   LYMPHSABS 2.2 03/07/2018 1032   EOSABS 0.2 03/07/2018 1032   BASOSABS 0.1 03/07/2018 1032   Iron/TIBC/Ferritin/ %Sat No results found for: IRON, TIBC, FERRITIN, IRONPCTSAT Lipid Panel     Component Value Date/Time   CHOL 197 03/07/2018 1032   TRIG 93 03/07/2018 1032   HDL 61 03/07/2018 1032   LDLCALC 117 (H) 03/07/2018 1032    Hepatic Function Panel     Component Value Date/Time   PROT 6.8 03/07/2018 1032   ALBUMIN 4.3 03/07/2018 1032   AST 16 03/07/2018 1032   ALT 10 03/07/2018 1032   ALKPHOS 69 03/07/2018 1032   BILITOT 0.3 03/07/2018 1032  Component Value Date/Time   TSH 0.528 03/07/2018 1032      OBESITY BEHAVIORAL INTERVENTION VISIT  Today's visit was # 13   Starting weight: 220 lbs Starting date: 03/07/2018 Today's weight : 198 lbs Today's date: 10/30/2018 Total lbs lost to date: 22    ASK: We discussed the diagnosis of obesity with Thurnell Lose today and Nydia agreed to give [name] permission to discuss obesity behavioral modification therapy today.  ASSESS: Haillie has the diagnosis of obesity and her BMI today is 30.11 Alishba is in the action stage of change   ADVISE: Selia was educated on the multiple health risks of obesity as well as the benefit of weight loss to improve her health. She was advised of the need for long term treatment and the importance of lifestyle modifications to improve her current health and to decrease her risk of future health problems.  AGREE: Multiple dietary modification options and treatment options were discussed and  Sherhonda agreed to follow the recommendations documented in the above note.  ARRANGE: Aishi was educated on the importance of frequent visits to treat obesity as outlined per CMS and USPSTF guidelines and agreed to schedule her next follow up appointment today.  I, Burt Knack, am acting as transcriptionist for Quillian Quince, MD I have reviewed the above documentation for accuracy and completeness, and I agree with the above. -Quillian Quince, MD

## 2018-11-12 ENCOUNTER — Ambulatory Visit (INDEPENDENT_AMBULATORY_CARE_PROVIDER_SITE_OTHER): Payer: BC Managed Care – PPO | Admitting: Professional

## 2018-11-12 DIAGNOSIS — F411 Generalized anxiety disorder: Secondary | ICD-10-CM | POA: Diagnosis not present

## 2018-11-12 DIAGNOSIS — F331 Major depressive disorder, recurrent, moderate: Secondary | ICD-10-CM

## 2018-11-20 ENCOUNTER — Other Ambulatory Visit: Payer: Self-pay

## 2018-11-20 ENCOUNTER — Encounter (INDEPENDENT_AMBULATORY_CARE_PROVIDER_SITE_OTHER): Payer: Self-pay | Admitting: Family Medicine

## 2018-11-20 ENCOUNTER — Telehealth (INDEPENDENT_AMBULATORY_CARE_PROVIDER_SITE_OTHER): Payer: BC Managed Care – PPO | Admitting: Family Medicine

## 2018-11-20 DIAGNOSIS — G4709 Other insomnia: Secondary | ICD-10-CM | POA: Diagnosis not present

## 2018-11-20 DIAGNOSIS — R7303 Prediabetes: Secondary | ICD-10-CM

## 2018-11-20 DIAGNOSIS — E038 Other specified hypothyroidism: Secondary | ICD-10-CM | POA: Diagnosis not present

## 2018-11-20 DIAGNOSIS — E559 Vitamin D deficiency, unspecified: Secondary | ICD-10-CM

## 2018-11-20 DIAGNOSIS — Z6830 Body mass index (BMI) 30.0-30.9, adult: Secondary | ICD-10-CM

## 2018-11-20 DIAGNOSIS — E66811 Obesity, class 1: Secondary | ICD-10-CM

## 2018-11-20 DIAGNOSIS — F3289 Other specified depressive episodes: Secondary | ICD-10-CM

## 2018-11-20 DIAGNOSIS — E669 Obesity, unspecified: Secondary | ICD-10-CM

## 2018-11-20 MED ORDER — VITAMIN D (ERGOCALCIFEROL) 1.25 MG (50000 UNIT) PO CAPS: 50000.0000 [IU] | ORAL_CAPSULE | ORAL | 0 refills | Status: DC

## 2018-11-20 MED ORDER — TRAZODONE HCL 50 MG PO TABS: 25.0000 mg | ORAL_TABLET | Freq: Every evening | ORAL | 0 refills | Status: DC | PRN

## 2018-11-20 MED ORDER — LEVOTHYROXINE SODIUM 100 MCG PO TABS: 100.0000 ug | ORAL_TABLET | Freq: Every day | ORAL | 0 refills | Status: AC

## 2018-11-20 MED ORDER — METFORMIN HCL 500 MG PO TABS: 500.0000 mg | ORAL_TABLET | Freq: Every day | ORAL | 0 refills | Status: DC

## 2018-11-20 MED ORDER — TOPIRAMATE 25 MG PO TABS: 25.0000 mg | ORAL_TABLET | Freq: Every day | ORAL | 0 refills | Status: DC

## 2018-11-21 NOTE — Progress Notes (Signed)
Office: [phone]  /  Fax: [phone] TeleHealth Visit:  [name] has verbally consented to this TeleHealth visit today. The patient is located at home, the provider is located at the [facility] office. The participants in this visit include the listed provider and patient and any and all parties involved. The visit was conducted today via FaceTime.  HPI:   Chief Complaint: OBESITY Maureen [name] is here to discuss her progress with her obesity treatment plan. She is on the Category 2 plan and is following her eating plan approximately 50 % of the time. She states she is walking 45 minutes 2 times per week. Maureen [name] has been sick recently, with migraine and sinus problems, and she was off the plan for the last week. She reports being off the plan and snacking on simple carbs (candy). Maureen [name] was also drinking juice while she was sick. We were unable to weigh the patient today for this TeleHealth visit. She feels as if she has maintained weight since her last visit (weight not reported). She has lost 22 lbs since starting treatment with us.  Pre-Diabetes Maureen [name] has a diagnosis of prediabetes based on her elevated Hgb A1c and was informed this puts her at greater risk of developing diabetes. She admits to cravings. She has recently been eating quite a few simple carbohydrates. She is taking metformin currently and continues to work on diet and exercise to decrease risk of diabetes. She denies nausea or hypoglycemia.  Hypothyroidism Maureen [name] has a diagnosis of hypothyroidism. She is stable on 100 mcg of synthroid daily. She denies hot or cold intolerance or palpitations.  Insomnia, well controlled Maureen [name] has a diagnosis of insomnia. She takes Trazodone for this (1 tablet), which she reports helps her go to sleep quickly and improves the quality of her sleep.  Vitamin D deficiency Maureen [name] has a diagnosis of vitamin D deficiency. Her last vitamin D level was at 12.6 on 03/07/18.  Maureen [name] is currently taking vit D and she denies nausea, vomiting or muscle weakness.  Depression with emotional eating behaviors Maureen [name] is struggling with emotional eating and using food for comfort to the extent that it is negatively impacting her health. She often snacks when she is not hungry. Chrislyn sometimes feels she is out of control and then feels guilty that she made poor food choices. She has been working on behavior modification techniques to help reduce her emotional eating and has been somewhat successful. Maureen [name] feels Topamax helps with cravings. She denies daytime somnolence. She shows no sign of suicidal or homicidal ideations.  ASSESSMENT AND PLAN:  Prediabetes - Plan: metFORMIN (GLUCOPHAGE) 500 MG tablet  Other insomnia - Plan: traZODone (DESYREL) 50 MG tablet  Vitamin D deficiency - Plan: Vitamin D, Ergocalciferol, (DRISDOL) 1.25 MG (50000 UT) CAPS capsule  Other specified hypothyroidism - Plan: levothyroxine (SYNTHROID) 100 MCG tablet  Other depression - with emotional eating - Plan: topiramate (TOPAMAX) 25 MG tablet  Class 1 obesity with serious comorbidity and body mass index (BMI) of 30.0 to 30.9 in adult, unspecified obesity type  PLAN:  Pre-Diabetes Maureen [name] will continue to work on weight loss, exercise, and decreasing simple carbohydrates in her diet to help decrease the risk of diabetes. We dicussed metformin including benefits and risks. She was informed that eating too many simple carbohydrates or too many calories at one sitting increases the likelihood of GI side effects. Yvetta agreed to continue metformin 500 mg q AM #30 with no refills and follow up with us as  directed to monitor her progress.  Hypothyroidism Kalis was informed of the importance of good thyroid control to help with weight loss efforts. She was also informed that supertheraputic thyroid levels are dangerous and will not improve weight loss results. Marguita agrees to continue levothyroxine 100  mcg daily before breakfast #30 with no refills and follow up as directed.  Insomnia, well controlled Valyn agreed to continue Trazodone 0.5-1 tablet (25 to 50 mg total) q HS as needed for sleep #30 with no refills. She agrees to follow up with our clinic in 2 to 3 weeks.  Vitamin D Deficiency Chey was informed that low vitamin D levels contributes to fatigue and are associated with obesity, breast, and colon cancer. Guliana agrees to continue to take prescription Vit D @50 ,000 IU every week #4 with no refills and she will follow up for routine testing of vitamin D, at least 2-3 times per year. She was informed of the risk of over-replacement of vitamin D and agrees to not increase her dose unless she discusses this with [name] first. Kilah agrees to follow up with our clinic in 2 to 3 weeks.  Depression with Emotional Eating Behaviors We discussed behavior modification techniques today to help Venessa deal with her emotional eating and depression. She has agreed to continue Topamax 25 mg q HS #30 with no refills and follow up as directed.  Obesity Gracy is currently in the action stage of change. As such, her goal is to continue with weight loss efforts She has agreed to keep a food journal with 400 to 550 calories and 40+ grams of protein at supper daily and follow the Category 2 plan Rasheedah has been instructed to work up to a goal of 150 minutes of combined cardio and strengthening exercise per week for weight loss and overall health benefits. We discussed the following Behavioral Modification Strategies today: planning for success, keeping healthy foods in the home, better snacking choices, increasing lean protein intake, decreasing simple carbohydrates, increasing vegetables, avoiding temptations and decrease liquid calories We discussed strategies for better snacking choices and avoiding temptation.  Chriss has agreed to follow up with our clinic in 2 to 3 weeks. She was informed of the  importance of frequent follow up visits to maximize her success with intensive lifestyle modifications for her multiple health conditions.  ALLERGIES: Allergies  Allergen Reactions   Lactose Intolerance (Gi)    Vicodin [Hydrocodone-Acetaminophen] Nausea And Vomiting    MEDICATIONS: Current Outpatient Medications on File Prior to Visit  Medication Sig Dispense Refill   cyclobenzaprine (FLEXERIL) 10 MG tablet Take 10 mg by mouth 3 (three) times daily as needed for muscle spasms.     LORazepam (ATIVAN) 0.5 MG tablet Take 0.5 mg by mouth at bedtime.     norethindrone-ethinyl estradiol (JUNEL FE,GILDESS FE,LOESTRIN FE) 1-20 MG-MCG tablet Take 1 tablet by mouth daily.     No current facility-administered medications on file prior to visit.     PAST MEDICAL HISTORY: Past Medical History:  Diagnosis Date   Abdominal pain    Anxiety    Back pain    Constipation    Depression    Dyspnea    GERD (gastroesophageal reflux disease)    HTN (hypertension)    Hypothyroidism    Lactose intolerance    Vaginal bleeding, abnormal     PAST SURGICAL HISTORY: Past Surgical History:  Procedure Laterality Date   CERCLAGE LAPAROSCOPIC ABDOMINAL     [phone]    SOCIAL HISTORY: Social History  Tobacco Use   Smoking status: Never Smoker   Smokeless tobacco: Never Used  Substance Use Topics   Alcohol use: Not on file   Drug use: Not on file    FAMILY HISTORY: Family History  Problem Relation Age of Onset   Hypertension Mother    Thyroid disease Mother    Depression Mother    Anxiety disorder Mother     ROS: Review of Systems  Constitutional: Negative for weight loss.  Cardiovascular: Negative for palpitations.  Gastrointestinal: Negative for nausea and vomiting.  Musculoskeletal:       Negative for muscle weakness  Endo/Heme/Allergies:       Negative for heat or cold intolerance Positive for cravings Negative for hypoglycemia    Psychiatric/Behavioral: Positive for depression. Negative for suicidal ideas. The patient has insomnia.     PHYSICAL EXAM: Pt in no acute distress  RECENT LABS AND TESTS: BMET    Component Value Date/Time   NA 138 03/07/2018 1032   K 4.6 03/07/2018 1032   CL 102 03/07/2018 1032   CO2 21 03/07/2018 1032   GLUCOSE 81 03/07/2018 1032   BUN 11 03/07/2018 1032   CREATININE 0.91 03/07/2018 1032   CALCIUM 9.3 03/07/2018 1032   GFRNONAA 74 03/07/2018 1032   GFRAA 85 03/07/2018 1032   Lab Results  Component Value Date   HGBA1C 5.3 03/07/2018   Lab Results  Component Value Date   INSULIN 16.5 03/07/2018   CBC    Component Value Date/Time   WBC 8.3 03/07/2018 1032   RBC 4.86 03/07/2018 1032   HGB 13.4 03/07/2018 1032   HCT 41.0 03/07/2018 1032   MCV 84 03/07/2018 1032   MCH 27.6 03/07/2018 1032   MCHC 32.7 03/07/2018 1032   RDW 13.2 03/07/2018 1032   LYMPHSABS 2.2 03/07/2018 1032   EOSABS 0.2 03/07/2018 1032   BASOSABS 0.1 03/07/2018 1032   Iron/TIBC/Ferritin/ %Sat No results found for: IRON, TIBC, FERRITIN, IRONPCTSAT Lipid Panel     Component Value Date/Time   CHOL 197 03/07/2018 1032   TRIG 93 03/07/2018 1032   HDL 61 03/07/2018 1032   LDLCALC 117 (H) 03/07/2018 1032   Hepatic Function Panel     Component Value Date/Time   PROT 6.8 03/07/2018 1032   ALBUMIN 4.3 03/07/2018 1032   AST 16 03/07/2018 1032   ALT 10 03/07/2018 1032   ALKPHOS 69 03/07/2018 1032   BILITOT 0.3 03/07/2018 1032      Component Value Date/Time   TSH 0.528 03/07/2018 1032     Ref. Range 03/07/2018 10:32  Vitamin D, 25-Hydroxy Latest Ref Range: 30.0 - 100.0 ng/mL 12.6 (L)    I, [place] Crane, am acting as Energy manager for [place], FNP-C.  I have reviewed the above documentation for accuracy and completeness, and I agree with the above.  - Janyth Riera, FNP-C.

## 2018-11-22 DIAGNOSIS — E038 Other specified hypothyroidism: Secondary | ICD-10-CM | POA: Insufficient documentation

## 2018-11-22 DIAGNOSIS — R7303 Prediabetes: Secondary | ICD-10-CM | POA: Insufficient documentation

## 2018-11-22 DIAGNOSIS — G4709 Other insomnia: Secondary | ICD-10-CM | POA: Insufficient documentation

## 2018-11-28 ENCOUNTER — Ambulatory Visit (INDEPENDENT_AMBULATORY_CARE_PROVIDER_SITE_OTHER): Payer: BC Managed Care – PPO | Admitting: Professional

## 2018-11-28 DIAGNOSIS — F331 Major depressive disorder, recurrent, moderate: Secondary | ICD-10-CM

## 2018-12-10 ENCOUNTER — Ambulatory Visit (INDEPENDENT_AMBULATORY_CARE_PROVIDER_SITE_OTHER): Payer: BC Managed Care – PPO | Admitting: Family Medicine

## 2018-12-18 ENCOUNTER — Other Ambulatory Visit: Payer: Self-pay

## 2018-12-18 ENCOUNTER — Telehealth (INDEPENDENT_AMBULATORY_CARE_PROVIDER_SITE_OTHER): Payer: BC Managed Care – PPO | Admitting: Family Medicine

## 2018-12-18 ENCOUNTER — Encounter (INDEPENDENT_AMBULATORY_CARE_PROVIDER_SITE_OTHER): Payer: Self-pay | Admitting: Family Medicine

## 2018-12-18 DIAGNOSIS — R7303 Prediabetes: Secondary | ICD-10-CM | POA: Diagnosis not present

## 2018-12-18 DIAGNOSIS — E559 Vitamin D deficiency, unspecified: Secondary | ICD-10-CM

## 2018-12-18 DIAGNOSIS — F3289 Other specified depressive episodes: Secondary | ICD-10-CM | POA: Diagnosis not present

## 2018-12-18 DIAGNOSIS — G4709 Other insomnia: Secondary | ICD-10-CM

## 2018-12-18 DIAGNOSIS — E669 Obesity, unspecified: Secondary | ICD-10-CM

## 2018-12-18 DIAGNOSIS — Z6830 Body mass index (BMI) 30.0-30.9, adult: Secondary | ICD-10-CM

## 2018-12-19 ENCOUNTER — Ambulatory Visit: Payer: BC Managed Care – PPO | Admitting: Professional

## 2018-12-19 MED ORDER — METFORMIN HCL 500 MG PO TABS: 500.0000 mg | ORAL_TABLET | Freq: Every day | ORAL | 0 refills | Status: DC

## 2018-12-19 MED ORDER — TRAZODONE HCL 50 MG PO TABS: 25.0000 mg | ORAL_TABLET | Freq: Every evening | ORAL | 0 refills | Status: DC | PRN

## 2018-12-19 MED ORDER — VITAMIN D (ERGOCALCIFEROL) 1.25 MG (50000 UNIT) PO CAPS: 50000.0000 [IU] | ORAL_CAPSULE | ORAL | 0 refills | Status: DC

## 2018-12-19 NOTE — Progress Notes (Signed)
Office: [phone]  /  Fax: [phone] TeleHealth Visit:  Maureen [name] has verbally consented to this TeleHealth visit today. The patient is located at home, the provider is located at the [facility] office. The participants in this visit include the listed provider and patient and any and all parties involved. The visit was conducted today via FaceTime.  HPI:   Chief Complaint: OBESITY Maureen [name] is here to discuss her progress with her obesity treatment plan. She is on the Category 2 plan and is following her eating plan approximately 50 % of the time. She states she is walking 40 to 45 minutes 1 time per week. Maureen [name] has been off the plan since the week before Thanksgiving. She did not have the foods she needed to stick to the plan but she shopped today and she is back on the plan. We were unable to weigh the patient today for this TeleHealth visit. She feels as if she has gained weight since her last visit (weight not reported). She has lost 22 lbs since starting treatment with Maureen [name].  Insomnia Maureen [name]'s insomnia is well controlled with Trazodone nightly.  Vitamin D deficiency Maureen [name] has a diagnosis of vitamin D deficiency. Her last vitamin D level was at 12.6 on 03/07/18 and was not at goal. Maureen [name] is currently taking vit D and she denies nausea, vomiting or muscle weakness.  Pre-Diabetes Maureen [name] has a diagnosis of prediabetes based on her elevated Hgb A1c and was informed this puts her at greater risk of developing diabetes. Her last A1c was at 5.3 on 03/07/2018. Maureen [name] is taking metformin daily and she continues to work on diet and exercise to decrease risk of diabetes. She denies nausea or hypoglycemia. Lab Results  Component Value Date   HGBA1C 5.3 03/07/2018    Depression with emotional eating behaviors Maureen [name] wishes to discontinue Topamax. She was recently started on Viibryd 20 mg daily by her PCP. She feels that her mood is stable at this point and can tell the  Viibryd is helping. She has had trouble dealing with the stress of dealing with her 50 year old daughter who ran away this past summer. Her daughter is now back at home.  She shows no sign of suicidal or homicidal ideations.  ASSESSMENT AND PLAN:  Other insomnia - Plan: traZODone (DESYREL) 50 MG tablet  Vitamin D deficiency - Plan: Vitamin D, Ergocalciferol, (DRISDOL) 1.25 MG (50000 UT) CAPS capsule  Prediabetes - Plan: metFORMIN (GLUCOPHAGE) 500 MG tablet  Other depression, emotional eating  Class 1 obesity with serious comorbidity and body mass index (BMI) of 30.0 to 30.9 in adult, unspecified obesity type  PLAN:  Insomnia Maureen [name] will continue Trazodone 50 mg at bedtime as needed #30 with no refills and follow up as directed.  Vitamin D Deficiency Maureen [name] was informed that low vitamin D levels contributes to fatigue and are associated with obesity, breast, and colon cancer. Maureen [name] agrees to continue to take prescription Vit D @50 ,000 IU every week #4 with no refills and she will follow up for routine testing of vitamin D, at least 2-3 times per year. She was informed of the risk of over-replacement of vitamin D and agrees to not increase her dose unless she discusses this with Maureen [name] first. We will check vitamin D level at the next visit and Maureen [name] agrees to follow up as directed.  Pre-Diabetes Maureen [name] will continue to work on weight loss, exercise, and decreasing simple carbohydrates in her diet to help decrease the risk  of diabetes. She was informed that eating too many simple carbohydrates or too many calories at one sitting increases the likelihood of GI side effects. Maureen [name] agrees to continue metformin 500 mg daily with breakfast #30 with no refills and follow up with [name] as directed to monitor her progress. We will check A1c at the next visit.  Emotional Eating Behaviors (other depression) We discussed behavior modification techniques today to help Maureen [name] deal with her emotional eating  behaviors. She has agreed to discontinue Topamax and continue Viibryd. Maureen [name] agreed to follow up as directed.  Obesity Maureen [name] is currently in the action stage of change. As such, her goal is to continue with weight loss efforts She has agreed to keep a food journal with 400 to 550 calories and 40 grams of protein daily at supper and follow the Category 2 plan Maureen [name] has been instructed to work up to a goal of 150 minutes of combined cardio and strengthening exercise per week for weight loss and overall health benefits. We discussed the following Behavioral Modification Strategies today: planning for success, keeping healthy foods in the home, better snacking choices, increasing lean protein intake, decreasing simple carbohydrates  and work on meal planning and easy cooking plans I encouraged Maureen [name] to get rid of her leftovers from Thanksgiving.  Maureen [name] has agreed to follow up with our clinic in 3 weeks. She was informed of the importance of frequent follow up visits to maximize her success with intensive lifestyle modifications for her multiple health conditions.  ALLERGIES: Allergies  Allergen Reactions  . Lactose Intolerance (Gi)   . Vicodin [Hydrocodone-Acetaminophen] Nausea And Vomiting    MEDICATIONS: Current Outpatient Medications on File Prior to Visit  Medication Sig Dispense Refill  . amLODipine (NORVASC) 2.5 MG tablet Take 2.5 mg by mouth daily.    . cyclobenzaprine (FLEXERIL) 10 MG tablet Take 10 mg by mouth 3 (three) times daily as needed for muscle spasms.    Marland Kitchen levothyroxine (SYNTHROID) 100 MCG tablet Take 1 tablet (100 mcg total) by mouth daily before breakfast. 30 tablet 0  . LORazepam (ATIVAN) 0.5 MG tablet Take 0.5 mg by mouth at bedtime.    . norethindrone-ethinyl estradiol (JUNEL FE,GILDESS FE,LOESTRIN FE) 1-20 MG-MCG tablet Take 1 tablet by mouth daily.    Marland Kitchen VIIBRYD 20 MG TABS Take 1 tablet by mouth daily.     No current facility-administered medications on file  prior to visit.     PAST MEDICAL HISTORY: Past Medical History:  Diagnosis Date  . Abdominal pain   . Anxiety   . Back pain   . Constipation   . Depression   . Dyspnea   . GERD (gastroesophageal reflux disease)   . HTN (hypertension)   . Hypothyroidism   . Lactose intolerance   . Vaginal bleeding, abnormal     PAST SURGICAL HISTORY: Past Surgical History:  Procedure Laterality Date  . CERCLAGE LAPAROSCOPIC ABDOMINAL     [phone]    SOCIAL HISTORY: Social History   Tobacco Use  . Smoking status: Never Smoker  . Smokeless tobacco: Never Used  Substance Use Topics  . Alcohol use: Not on file  . Drug use: Not on file    FAMILY HISTORY: Family History  Problem Relation Age of Onset  . Hypertension Mother   . Thyroid disease Mother   . Depression Mother   . Anxiety disorder Mother     ROS: Review of Systems  Constitutional: Negative for weight loss.  Gastrointestinal: Negative for nausea and vomiting.  Musculoskeletal:       Negative for muscle weakness  Endo/Heme/Allergies:       Negative for hypoglycemia  Psychiatric/Behavioral: Positive for depression. Negative for suicidal ideas. The patient has insomnia.        Positive for stress    PHYSICAL EXAM: Pt in no acute distress  RECENT LABS AND TESTS: BMET    Component Value Date/Time   NA 138 03/07/2018 1032   K 4.6 03/07/2018 1032   CL 102 03/07/2018 1032   CO2 21 03/07/2018 1032   GLUCOSE 81 03/07/2018 1032   BUN 11 03/07/2018 1032   CREATININE 0.91 03/07/2018 1032   CALCIUM 9.3 03/07/2018 1032   GFRNONAA 74 03/07/2018 1032   GFRAA 85 03/07/2018 1032   Lab Results  Component Value Date   HGBA1C 5.3 03/07/2018   Lab Results  Component Value Date   INSULIN 16.5 03/07/2018   CBC    Component Value Date/Time   WBC 8.3 03/07/2018 1032   RBC 4.86 03/07/2018 1032   HGB 13.4 03/07/2018 1032   HCT 41.0 03/07/2018 1032   MCV 84 03/07/2018 1032   MCH 27.6 03/07/2018 1032   MCHC 32.7  03/07/2018 1032   RDW 13.2 03/07/2018 1032   LYMPHSABS 2.2 03/07/2018 1032   EOSABS 0.2 03/07/2018 1032   BASOSABS 0.1 03/07/2018 1032   Iron/TIBC/Ferritin/ %Sat No results found for: IRON, TIBC, FERRITIN, IRONPCTSAT Lipid Panel     Component Value Date/Time   CHOL 197 03/07/2018 1032   TRIG 93 03/07/2018 1032   HDL 61 03/07/2018 1032   LDLCALC 117 (H) 03/07/2018 1032   Hepatic Function Panel     Component Value Date/Time   PROT 6.8 03/07/2018 1032   ALBUMIN 4.3 03/07/2018 1032   AST 16 03/07/2018 1032   ALT 10 03/07/2018 1032   ALKPHOS 69 03/07/2018 1032   BILITOT 0.3 03/07/2018 1032      Component Value Date/Time   TSH 0.528 03/07/2018 1032     Ref. Range 03/07/2018 10:32  Vitamin D, 25-Hydroxy Latest Ref Range: 30.0 - 100.0 ng/mL 12.6 (L)    I, [place] Crane, am acting as Energy manager for [place], FNP-C.  I have reviewed the above documentation for accuracy and completeness, and I agree with the above.  - Monseratt Ledin, FNP-C.

## 2018-12-24 ENCOUNTER — Ambulatory Visit (INDEPENDENT_AMBULATORY_CARE_PROVIDER_SITE_OTHER): Payer: BC Managed Care – PPO | Admitting: Professional

## 2018-12-24 DIAGNOSIS — F411 Generalized anxiety disorder: Secondary | ICD-10-CM

## 2018-12-24 DIAGNOSIS — F331 Major depressive disorder, recurrent, moderate: Secondary | ICD-10-CM | POA: Diagnosis not present

## 2019-01-08 ENCOUNTER — Ambulatory Visit (INDEPENDENT_AMBULATORY_CARE_PROVIDER_SITE_OTHER): Payer: BC Managed Care – PPO | Admitting: Family Medicine

## 2019-01-08 ENCOUNTER — Other Ambulatory Visit: Payer: Self-pay

## 2019-01-08 ENCOUNTER — Encounter (INDEPENDENT_AMBULATORY_CARE_PROVIDER_SITE_OTHER): Payer: Self-pay | Admitting: Family Medicine

## 2019-01-08 VITALS — BP 134/78 | HR 87 | Temp 98.6°F | Ht 68.0 in | Wt 203.0 lb

## 2019-01-08 DIAGNOSIS — E669 Obesity, unspecified: Secondary | ICD-10-CM

## 2019-01-08 DIAGNOSIS — E559 Vitamin D deficiency, unspecified: Secondary | ICD-10-CM

## 2019-01-08 DIAGNOSIS — E7849 Other hyperlipidemia: Secondary | ICD-10-CM | POA: Diagnosis not present

## 2019-01-08 DIAGNOSIS — E8881 Metabolic syndrome: Secondary | ICD-10-CM | POA: Diagnosis not present

## 2019-01-08 DIAGNOSIS — Z6831 Body mass index (BMI) 31.0-31.9, adult: Secondary | ICD-10-CM

## 2019-01-08 DIAGNOSIS — G4709 Other insomnia: Secondary | ICD-10-CM | POA: Diagnosis not present

## 2019-01-08 DIAGNOSIS — Z9189 Other specified personal risk factors, not elsewhere classified: Secondary | ICD-10-CM

## 2019-01-08 MED ORDER — TRAZODONE HCL 50 MG PO TABS: 25.0000 mg | ORAL_TABLET | Freq: Every evening | ORAL | 0 refills | Status: DC | PRN

## 2019-01-08 MED ORDER — VITAMIN D (ERGOCALCIFEROL) 1.25 MG (50000 UNIT) PO CAPS: 50000.0000 [IU] | ORAL_CAPSULE | ORAL | 0 refills | Status: DC

## 2019-01-09 LAB — COMPREHENSIVE METABOLIC PANEL
ALT: 6 IU/L (ref 0–32)
AST: 11 IU/L (ref 0–40)
Albumin/Globulin Ratio: 1.6 (ref 1.2–2.2)
Albumin: 4.2 g/dL (ref 3.8–4.8)
Alkaline Phosphatase: 65 IU/L (ref 39–117)
BUN/Creatinine Ratio: 18 (ref 9–23)
BUN: 16 mg/dL (ref 6–24)
Bilirubin Total: 0.5 mg/dL (ref 0.0–1.2)
CO2: 21 mmol/L (ref 20–29)
Calcium: 9.1 mg/dL (ref 8.7–10.2)
Chloride: 102 mmol/L (ref 96–106)
Creatinine, Ser: 0.89 mg/dL (ref 0.57–1.00)
GFR calc Af Amer: 87 mL/min/{1.73_m2} (ref >59)
GFR calc non Af Amer: 76 mL/min/{1.73_m2} (ref >59)
Globulin, Total: 2.6 g/dL (ref 1.5–4.5)
Glucose: 69 mg/dL (ref 65–99)
Potassium: 4.4 mmol/L (ref 3.5–5.2)
Sodium: 137 mmol/L (ref 134–144)
Total Protein: 6.8 g/dL (ref 6.0–8.5)

## 2019-01-09 LAB — HEMOGLOBIN A1C
Est. average glucose Bld gHb Est-mCnc: 97 mg/dL
Hgb A1c MFr Bld: 5 % (ref 4.8–5.6)

## 2019-01-09 LAB — LIPID PANEL WITH LDL/HDL RATIO
Cholesterol, Total: 227 mg/dL — ABNORMAL HIGH (ref 100–199)
HDL: 68 mg/dL (ref >39)
LDL Chol Calc (NIH): 138 mg/dL — ABNORMAL HIGH (ref 0–99)
LDL/HDL Ratio: 2 ratio (ref 0.0–3.2)
Triglycerides: 119 mg/dL (ref 0–149)
VLDL Cholesterol Cal: 21 mg/dL (ref 5–40)

## 2019-01-09 LAB — VITAMIN D 25 HYDROXY (VIT D DEFICIENCY, FRACTURES): Vit D, 25-Hydroxy: 32.6 ng/mL (ref 30.0–100.0)

## 2019-01-09 LAB — INSULIN, RANDOM: INSULIN: 10 u[IU]/mL (ref 2.6–24.9)

## 2019-01-14 NOTE — Progress Notes (Signed)
Office: [phone]  /  Fax: [phone]   HPI:  Chief Complaint: OBESITY Maureen [name] is here to discuss her progress with her obesity treatment plan. She is on the keep a food journal with 400-550 calories and 40 grams of protein at supper daily and follow the Category 2 plan and states she is following her eating plan approximately 20 % of the time. She states she is walking for 40-45 minutes 1 time per week.  Maureen [name] has struggled with meal planning and prepping since her last visit. She feels she has done well minimizing snacking, and feels she will be able to get through the rest of the year with maintaining her weight.  Today's visit was # 16  Starting weight: 220 lbs Starting date: 03/07/2018 Today's weight : 203 lbs Today's date: 01/08/2019 Total lbs lost to date: 17 Total lbs lost since last in-office visit: 0  Insomnia Maureen [name] complains of insomnia. She feels she is sleeping better on Trazodone, but her sleep is still fractured and she isn't always waking up rested.  Vitamin D Deficiency Maureen [name] has a diagnosis of vitamin D deficiency. She is stable on Vit D, and she is due to have labs checked.  Insulin Resistance Maureen [name] has a diagnosis of insulin resistance. She has been working on diet and exercise. She is due to have labs checked.  Hyperlipidemia Maureen [name] has a diagnosis of hyperlipidemia. She is working on diet and exercise, and she denies chest pain. She is due to have labs checked.  At risk for cardiovascular disease Maureen [name] is at a higher than average risk for cardiovascular disease due to obesity and hyperlipidemia. She currently denies any chest pain.  ASSESSMENT AND PLAN:  Other insomnia - Plan: traZODone (DESYREL) 50 MG tablet  Vitamin D deficiency - Plan: Vitamin D (25 hydroxy), Vitamin D, Ergocalciferol, (DRISDOL) 1.25 MG (50000 UT) CAPS capsule  Insulin resistance - Plan: HgB A1c, Insulin, random, Comprehensive Metabolic Panel (CMET)  Other hyperlipidemia -  Plan: Lipid Panel With LDL/HDL Ratio  At risk for heart disease  Class 1 obesity with serious comorbidity and body mass index (BMI) of 31.0 to 31.9 in adult, unspecified obesity type  PLAN:  Insomnia The problem of recurrent insomnia was discussed. Avoidance of caffeine was strongly encouraged and sleep hygiene issues were reviewed. Maureen [name] agrees to continue taking Trazodone 25-50 mg PO qhs as needed #30 and we will refill for 1 month. Maureen [name] was offered a sleep referral and she will consider and let Maureen [name] know.   Vitamin D Deficiency Low Vitamin D level contributes to fatigue and are associated with obesity, breast, and colon cancer. Maureen [name] agrees to continue taking prescription Vitamin D 50,000 IU every week #4 and we will refill for 1 month. She will follow up for routine testing of vitamin D, at least 2-3 times per year to avoid over-replacement. We will check labs today and Maureen [name] will continue to follow up with Maureen [name] as directed.  Insulin Resistance Maureen [name] will continue to work on weight loss, exercise, and decreasing simple carbohydrates to help decrease the risk of diabetes. We will check labs today. Maureen [name] agrees to follow-up with Maureen [name] as directed to closely monitor her progress.  Hyperlipidemia Intensive lifestyle modifications as the first line treatment for hyperlipidemia. We discussed many lifestyle modifications today and Maureen [name] will continue to work on diet, exercise and weight loss efforts. We will check labs today, and Maureen [name] will continue to follow up with Maureen [name] as directed.  Cardiovascular risk counseling Maureen [name] was given (~15  minutes) coronary artery disease prevention counseling today. She is 48 y.o. female and has risk factors for heart disease including obesity and hyperlipidemia. We discussed intensive lifestyle modifications today with an emphasis on specific weight loss instructions and strategies.   Obesity Maureen [name] is currently in the action stage of change. As such, her goal  is to continue with weight loss efforts. She has agreed to keep a food journal with 400-550 calories and 40 grams of protein at supper daily and follow the Category 2 plan. Maureen [name] has been instructed to work up to a goal of 150 minutes of combined cardio and strengthening exercise per week for weight loss and overall health benefits. We discussed the following Behavioral Modification Strategies today: dealing with family or coworker sabotage and holiday eating strategies .   Maureen [name] has agreed to follow-up with our clinic in 3 weeks. She was informed of the importance of frequent follow-up visits to maximize her success with intensive lifestyle modifications for her multiple health conditions.  ALLERGIES: Allergies  Allergen Reactions  . Lactose Intolerance (Gi)   . Vicodin [Hydrocodone-Acetaminophen] Nausea And Vomiting    MEDICATIONS: Current Outpatient Medications on File Prior to Visit  Medication Sig Dispense Refill  . amLODipine (NORVASC) 2.5 MG tablet Take 2.5 mg by mouth daily.    . cyclobenzaprine (FLEXERIL) 10 MG tablet Take 10 mg by mouth 3 (three) times daily as needed for muscle spasms.    Marland Kitchen levothyroxine (SYNTHROID) 100 MCG tablet Take 1 tablet (100 mcg total) by mouth daily before breakfast. 30 tablet 0  . LORazepam (ATIVAN) 0.5 MG tablet Take 0.5 mg by mouth at bedtime.    . metFORMIN (GLUCOPHAGE) 500 MG tablet Take 1 tablet (500 mg total) by mouth daily with breakfast. 30 tablet 0  . norethindrone-ethinyl estradiol (JUNEL FE,GILDESS FE,LOESTRIN FE) 1-20 MG-MCG tablet Take 1 tablet by mouth daily.    Marland Kitchen VIIBRYD 20 MG TABS Take 1 tablet by mouth daily.     No current facility-administered medications on file prior to visit.    PAST MEDICAL HISTORY: Past Medical History:  Diagnosis Date  . Abdominal pain   . Anxiety   . Back pain   . Constipation   . Depression   . Dyspnea   . GERD (gastroesophageal reflux disease)   . HTN (hypertension)   . Hypothyroidism   .  Lactose intolerance   . Vaginal bleeding, abnormal     PAST SURGICAL HISTORY: Past Surgical History:  Procedure Laterality Date  . CERCLAGE LAPAROSCOPIC ABDOMINAL     [phone]    SOCIAL HISTORY: Social History   Tobacco Use  . Smoking status: Never Smoker  . Smokeless tobacco: Never Used  Substance Use Topics  . Alcohol use: Not on file  . Drug use: Not on file    FAMILY HISTORY: Family History  Problem Relation Age of Onset  . Hypertension Mother   . Thyroid disease Mother   . Depression Mother   . Anxiety disorder Mother     ROS: Review of Systems  Constitutional: Negative for weight loss.  Cardiovascular: Negative for chest pain.  Psychiatric/Behavioral: The patient has insomnia.     PHYSICAL EXAM: Blood pressure 134/78, pulse 87, temperature 98.6 F (37 C), temperature source Oral, height 5\' 8"  (1.727 m), weight 203 lb (92.1 kg), SpO2 99 %. Body mass index is 30.87 kg/m. Physical Exam Vitals reviewed.  Constitutional:      Appearance: Normal appearance. She is obese.  Cardiovascular:     Rate and  Rhythm: Normal rate.     Pulses: Normal pulses.  Pulmonary:     Effort: Pulmonary effort is normal.  Musculoskeletal:        General: Normal range of motion.  Skin:    General: Skin is warm and dry.  Neurological:     Mental Status: She is alert and oriented to person, place, and time.  Psychiatric:        Mood and Affect: Mood normal.        Behavior: Behavior normal.     RECENT LABS AND TESTS: BMET    Component Value Date/Time   NA 137 01/08/2019 1122   K 4.4 01/08/2019 1122   CL 102 01/08/2019 1122   CO2 21 01/08/2019 1122   GLUCOSE 69 01/08/2019 1122   BUN 16 01/08/2019 1122   CREATININE 0.89 01/08/2019 1122   CALCIUM 9.1 01/08/2019 1122   GFRNONAA 76 01/08/2019 1122   GFRAA 87 01/08/2019 1122   Lab Results  Component Value Date   HGBA1C 5.0 01/08/2019   HGBA1C 5.3 03/07/2018   Lab Results  Component Value Date   INSULIN 10.0  01/08/2019   INSULIN 16.5 03/07/2018   CBC    Component Value Date/Time   WBC 8.3 03/07/2018 1032   RBC 4.86 03/07/2018 1032   HGB 13.4 03/07/2018 1032   HCT 41.0 03/07/2018 1032   MCV 84 03/07/2018 1032   MCH 27.6 03/07/2018 1032   MCHC 32.7 03/07/2018 1032   RDW 13.2 03/07/2018 1032   LYMPHSABS 2.2 03/07/2018 1032   EOSABS 0.2 03/07/2018 1032   BASOSABS 0.1 03/07/2018 1032   Iron/TIBC/Ferritin/ %Sat No results found for: IRON, TIBC, FERRITIN, IRONPCTSAT Lipid Panel     Component Value Date/Time   CHOL 227 (H) 01/08/2019 1122   TRIG 119 01/08/2019 1122   HDL 68 01/08/2019 1122   LDLCALC 138 (H) 01/08/2019 1122   Hepatic Function Panel     Component Value Date/Time   PROT 6.8 01/08/2019 1122   ALBUMIN 4.2 01/08/2019 1122   AST 11 01/08/2019 1122   ALT 6 01/08/2019 1122   ALKPHOS 65 01/08/2019 1122   BILITOT 0.5 01/08/2019 1122      Component Value Date/Time   TSH 0.528 03/07/2018 1032        I, Trixie Dredge, am acting as Location manager for Dennard Nip, MD.  I have reviewed the above documentation for accuracy and completeness, and I agree with the above. -Dennard Nip, MD

## 2019-01-16 ENCOUNTER — Ambulatory Visit (INDEPENDENT_AMBULATORY_CARE_PROVIDER_SITE_OTHER): Payer: BC Managed Care – PPO | Admitting: Professional

## 2019-01-16 DIAGNOSIS — F411 Generalized anxiety disorder: Secondary | ICD-10-CM

## 2019-01-16 DIAGNOSIS — F331 Major depressive disorder, recurrent, moderate: Secondary | ICD-10-CM | POA: Diagnosis not present

## 2019-01-17 ENCOUNTER — Ambulatory Visit: Payer: BC Managed Care – PPO | Attending: Internal Medicine

## 2019-01-17 ENCOUNTER — Other Ambulatory Visit: Payer: BC Managed Care – PPO

## 2019-01-17 DIAGNOSIS — Z20822 Contact with and (suspected) exposure to covid-19: Secondary | ICD-10-CM

## 2019-01-19 LAB — NOVEL CORONAVIRUS, NAA: SARS-CoV-2, NAA: NOT DETECTED

## 2019-01-31 ENCOUNTER — Other Ambulatory Visit: Payer: Self-pay

## 2019-01-31 ENCOUNTER — Encounter (INDEPENDENT_AMBULATORY_CARE_PROVIDER_SITE_OTHER): Payer: Self-pay | Admitting: Family Medicine

## 2019-01-31 ENCOUNTER — Ambulatory Visit (INDEPENDENT_AMBULATORY_CARE_PROVIDER_SITE_OTHER): Payer: BC Managed Care – PPO | Admitting: Family Medicine

## 2019-01-31 VITALS — BP 136/88 | HR 86 | Temp 98.4°F | Ht 68.0 in | Wt 209.0 lb

## 2019-01-31 DIAGNOSIS — E8881 Metabolic syndrome: Secondary | ICD-10-CM | POA: Diagnosis not present

## 2019-01-31 DIAGNOSIS — I10 Essential (primary) hypertension: Secondary | ICD-10-CM | POA: Diagnosis not present

## 2019-01-31 DIAGNOSIS — E669 Obesity, unspecified: Secondary | ICD-10-CM

## 2019-01-31 DIAGNOSIS — G4709 Other insomnia: Secondary | ICD-10-CM

## 2019-01-31 DIAGNOSIS — E559 Vitamin D deficiency, unspecified: Secondary | ICD-10-CM

## 2019-01-31 DIAGNOSIS — Z9189 Other specified personal risk factors, not elsewhere classified: Secondary | ICD-10-CM

## 2019-01-31 DIAGNOSIS — Z6831 Body mass index (BMI) 31.0-31.9, adult: Secondary | ICD-10-CM

## 2019-01-31 DIAGNOSIS — E88819 Insulin resistance, unspecified: Secondary | ICD-10-CM

## 2019-01-31 MED ORDER — AMLODIPINE BESYLATE 2.5 MG PO TABS: 2.5000 mg | ORAL_TABLET | Freq: Every day | ORAL | 0 refills | Status: DC

## 2019-01-31 MED ORDER — TRAZODONE HCL 50 MG PO TABS: 25.0000 mg | ORAL_TABLET | Freq: Every evening | ORAL | 0 refills | Status: DC | PRN

## 2019-01-31 MED ORDER — VITAMIN D (ERGOCALCIFEROL) 1.25 MG (50000 UNIT) PO CAPS: 50000.0000 [IU] | ORAL_CAPSULE | ORAL | 0 refills | Status: DC

## 2019-01-31 MED ORDER — METFORMIN HCL 500 MG PO TABS: 500.0000 mg | ORAL_TABLET | Freq: Every day | ORAL | 0 refills | Status: DC

## 2019-02-04 NOTE — Progress Notes (Signed)
Chief Complaint:   OBESITY Maureen [name] is here to discuss her progress with her obesity treatment plan along with follow-up of her obesity related diagnoses. Maureen [name] is on the Category 2 Plan and keeping a food journal and adhering to recommended goals of 400-500 calories and 40 grams of protein at supper daily and states she is following her eating plan approximately 60% of the time. Maureen [name] states she is walking for 30-40 minutes 2 times per week.  Today's visit was #: 17 Starting weight: 220 lbs Starting date: 03/07/2018 Today's weight: 209 lbs Today's date: 01/31/2019 Total lbs lost to date: 11 Total lbs lost since last in-office visit: 0  Interim History: Maureen [name] did some celebration eating over the holidays, and she has gained some weight. She is ready to get back on track with her plan.  Subjective:   1. Other insomnia Maureen [name] is still struggling with sleep and she feels the Viibryd she takes in the PM may contribute to this. She has no signs of serotonin syndrome.  2. Vitamin D deficiency Maureen [name]'s vit D has improved, but it is still not at goal. She notes fatigue. I discussed labs with the patient today.  3. Essential hypertension Maureen [name]'s blood pressure is controlled on her medications. She denies chest pain or dizziness.  4. Insulin resistance Maureen [name]'s A1c and insulin level are controlled with diet and metformin. She notes decreased polyphagia, and denies nausea, vomiting, to hypoglycemia. I discussed labs with the patient today.  5. At risk for heart disease Maureen [name] is at a higher than average risk for cardiovascular disease due to obesity. Reviewed: no chest pain on exertion, no dyspnea on exertion, and no swelling of ankles.  Assessment/Plan:   1. Other insomnia The problem of recurrent insomnia was discussed. Orders and follow up as documented in patient record. Counseling: Intensive lifestyle modifications are the first line treatment for this issue. We discussed several  lifestyle modifications today and she will continue to work on diet, exercise and weight loss efforts. We will refill Trazodone for 1 month and Maureen [name] is to change Viibryd to AM, and we will follow up on her progress.  Counseling  Limit or avoid alcohol, caffeinated beverages, and cigarettes, especially close to bedtime.   Do not eat a large meal or eat spicy foods right before bedtime. This can lead to digestive discomfort that can make it hard for you to sleep.  Keep a sleep diary to help you and your health care provider figure out what could be causing your insomnia.  . Make your bedroom a dark, comfortable place where it is easy to fall asleep. ? Put up shades or blackout curtains to block light from outside. ? Use a white noise machine to block noise. ? Keep the temperature cool. . Limit screen use before bedtime. This includes: ? Watching TV. ? Using your smartphone, tablet, or computer. . Stick to a routine that includes going to bed and waking up at the same times every day and night. This can help you fall asleep faster. Consider making a quiet activity, such as reading, part of your nighttime routine. . Try to avoid taking naps during the day so that you sleep better at night. . Get out of bed if you are still awake after 15 minutes of trying to sleep. Keep the lights down, but try reading or doing a quiet activity. When you feel sleepy, go back to bed.  - traZODone (DESYREL) 50 MG tablet; Take 0.5-1 tablets (25-50 mg  total) by mouth at bedtime as needed for sleep.  Dispense: 30 tablet; Refill: 0  2. Vitamin D deficiency Low Vitamin D level contributes to fatigue and are associated with obesity, breast, and colon cancer. We will refill prescripton Vit D for 1 month. She will follow-up for routine testing of Vitamin D, at least 2-3 times per year to avoid over-replacement. We will recheck labs in 2 months.  - Vitamin D, Ergocalciferol, (DRISDOL) 1.25 MG (50000 UNIT) CAPS capsule;  Take 1 capsule (50,000 Units total) by mouth every 7 (seven) days.  Dispense: 4 capsule; Refill: 0  3. Essential hypertension Maureen [name] is to get back to her diet prescription, and she will work on healthy weight loss and exercise to improve blood pressure control. We will watch for signs of hypotension as she continues her lifestyle modifications. We will refill Norvasc and will continue to monitor.  4. Insulin resistance Maureen [name] will continue to work on weight loss, exercise, and decreasing simple carbohydrates to help decrease the risk of diabetes. We will refill metformin for 1 month. Maureen [name] agreed to follow-up with [name] as directed to closely monitor her progress.  - metFORMIN (GLUCOPHAGE) 500 MG tablet; Take 1 tablet (500 mg total) by mouth daily with breakfast.  Dispense: 30 tablet; Refill: 0  5. At risk for heart disease Maureen [name] was given approximately 15 minutes of coronary artery disease prevention counseling today. She is 49 y.o. female and has risk factors for heart disease including obesity. We discussed intensive lifestyle modifications today with an emphasis on specific weight loss instructions and strategies.   6. Class 1 obesity with serious comorbidity and body mass index (BMI) of 31.0 to 31.9 in adult, unspecified obesity type Maureen [name] is currently in the action stage of change. As such, her goal is to continue with weight loss efforts. She has agreed to on the Category 2 Plan and keeping a food journal and adhering to recommended goals of 400-550 calories and 40 grams of protein at supper daily.   Exercise goals: Maureen [name] is to continue her current exercise regimen as is.  Behavioral modification strategies: increasing lean protein intake and meal planning and cooking strategies.  Maureen [name] has agreed to follow-up with our clinic in 3 weeks. She was informed of the importance of frequent follow-up visits to maximize her success with intensive lifestyle modifications for her multiple health  conditions.   Objective:   Blood pressure 136/88, pulse 86, temperature 98.4 F (36.9 C), temperature source Oral, height 5\' 8"  (1.727 m), weight 209 lb (94.8 kg), SpO2 99 %. Body mass index is 31.78 kg/m.  General: Cooperative, alert, well developed, in no acute distress. HEENT: Conjunctivae and lids unremarkable. Cardiovascular: Regular rhythm.  Lungs: Normal work of breathing. Neurologic: No focal deficits.   Lab Results  Component Value Date   CREATININE 0.89 01/08/2019   BUN 16 01/08/2019   NA 137 01/08/2019   K 4.4 01/08/2019   CL 102 01/08/2019   CO2 21 01/08/2019   Lab Results  Component Value Date   ALT 6 01/08/2019   AST 11 01/08/2019   ALKPHOS 65 01/08/2019   BILITOT 0.5 01/08/2019   Lab Results  Component Value Date   HGBA1C 5.0 01/08/2019   HGBA1C 5.3 03/07/2018   Lab Results  Component Value Date   INSULIN 10.0 01/08/2019   INSULIN 16.5 03/07/2018   Lab Results  Component Value Date   TSH 0.528 03/07/2018   Lab Results  Component Value Date   CHOL 227 (H) 01/08/2019  HDL 68 01/08/2019   LDLCALC 138 (H) 01/08/2019   TRIG 119 01/08/2019   Lab Results  Component Value Date   WBC 8.3 03/07/2018   HGB 13.4 03/07/2018   HCT 41.0 03/07/2018   MCV 84 03/07/2018   No results found for: IRON, TIBC, FERRITIN  Attestation Statements:   Reviewed by clinician on day of visit: allergies, medications, problem list, medical history, surgical history, family history, social history, and previous encounter notes.   I, Burt Knack, am acting as transcriptionist for Quillian Quince, MD.  I have reviewed the above documentation for accuracy and completeness, and I agree with the above. -  Quillian Quince, MD

## 2019-02-06 ENCOUNTER — Ambulatory Visit (INDEPENDENT_AMBULATORY_CARE_PROVIDER_SITE_OTHER): Payer: BC Managed Care – PPO | Admitting: Professional

## 2019-02-06 DIAGNOSIS — F411 Generalized anxiety disorder: Secondary | ICD-10-CM

## 2019-02-06 DIAGNOSIS — F331 Major depressive disorder, recurrent, moderate: Secondary | ICD-10-CM | POA: Diagnosis not present

## 2019-02-27 ENCOUNTER — Other Ambulatory Visit: Payer: Self-pay

## 2019-02-27 ENCOUNTER — Encounter (INDEPENDENT_AMBULATORY_CARE_PROVIDER_SITE_OTHER): Payer: Self-pay | Admitting: Family Medicine

## 2019-02-27 ENCOUNTER — Ambulatory Visit (INDEPENDENT_AMBULATORY_CARE_PROVIDER_SITE_OTHER): Payer: BC Managed Care – PPO | Admitting: Professional

## 2019-02-27 ENCOUNTER — Ambulatory Visit (INDEPENDENT_AMBULATORY_CARE_PROVIDER_SITE_OTHER): Payer: BC Managed Care – PPO | Admitting: Family Medicine

## 2019-02-27 VITALS — BP 131/88 | HR 86 | Temp 98.0°F | Ht 68.0 in | Wt 211.0 lb

## 2019-02-27 DIAGNOSIS — E559 Vitamin D deficiency, unspecified: Secondary | ICD-10-CM | POA: Diagnosis not present

## 2019-02-27 DIAGNOSIS — G4709 Other insomnia: Secondary | ICD-10-CM

## 2019-02-27 DIAGNOSIS — F411 Generalized anxiety disorder: Secondary | ICD-10-CM | POA: Diagnosis not present

## 2019-02-27 DIAGNOSIS — Z9189 Other specified personal risk factors, not elsewhere classified: Secondary | ICD-10-CM

## 2019-02-27 DIAGNOSIS — E669 Obesity, unspecified: Secondary | ICD-10-CM

## 2019-02-27 DIAGNOSIS — Z6832 Body mass index (BMI) 32.0-32.9, adult: Secondary | ICD-10-CM

## 2019-02-27 DIAGNOSIS — F331 Major depressive disorder, recurrent, moderate: Secondary | ICD-10-CM | POA: Diagnosis not present

## 2019-02-27 DIAGNOSIS — E8881 Metabolic syndrome: Secondary | ICD-10-CM

## 2019-02-27 MED ORDER — METFORMIN HCL 500 MG PO TABS: 500.0000 mg | ORAL_TABLET | Freq: Every day | ORAL | 0 refills | Status: DC

## 2019-02-27 MED ORDER — VITAMIN D (ERGOCALCIFEROL) 1.25 MG (50000 UNIT) PO CAPS: 50000.0000 [IU] | ORAL_CAPSULE | ORAL | 0 refills | Status: DC

## 2019-02-27 MED ORDER — TRAZODONE HCL 50 MG PO TABS: 25.0000 mg | ORAL_TABLET | Freq: Every evening | ORAL | 0 refills | Status: DC | PRN

## 2019-02-28 NOTE — Progress Notes (Signed)
Chief Complaint:   OBESITY Maureen [name] is here to discuss her progress with her obesity treatment plan along with follow-up of her obesity related diagnoses. Maureen [name] is on the Category 2 Plan and keeping a food journal and adhering to recommended goals of 400-550 calories and 40 grams of protein daily and states she is following her eating plan approximately 50% of the time. Maureen [name] states she is doing 0 minutes 0 times per week.  Today's visit was #: 18 Starting weight: 220 lbs Starting date: 03/07/2018 Today's weight: 211 lbs Today's date: 02/27/2019 Total lbs lost to date: 9 Total lbs lost since last in-office visit: 0  Interim History: Maureen [name] is changing jobs and has gotten off track with her eating plan. She isn't sure she will be able to be structured as much while transitioning to her new position.  Subjective:   1. Insulin resistance Maureen [name] is working on decreasing simple carbohydrates and weight loss. She is tolerating metformin well.  2. Vitamin D deficiency Maureen [name] is stable on Vit D, and she denies nausea, vomiting, or muscle weakness.  3. Other insomnia Maureen [name] is not sleeping as well due to stress and excitement of starting a new job. She has done well on the Trazodone.  4. At risk for deficient intake of food The patient is at a higher than average risk of deficient intake of food due to changing jobs and increased stress.  Assessment/Plan:   1. Insulin resistance Iysis will continue to work on weight loss, exercise, and decreasing simple carbohydrates to help decrease the risk of diabetes. We refill metformin with a 90 day supply with no refill. [name] agreed to follow-up with [name] as directed to closely monitor her progress.  - metFORMIN (GLUCOPHAGE) 500 MG tablet; Take 1 tablet (500 mg total) by mouth daily with breakfast.  Dispense: 30 tablet; Refill: 0  2. Vitamin D deficiency Low Vitamin D level contributes to fatigue and are associated with obesity, breast, and  colon cancer. We will refill prescription Vitamin D with a 90 day supply with no refill. Maureen [name] will follow-up for routine testing of Vitamin D, at least 2-3 times per year to avoid over-replacement.  - Vitamin D, Ergocalciferol, (DRISDOL) 1.25 MG (50000 UNIT) CAPS capsule; Take 1 capsule (50,000 Units total) by mouth every 7 (seven) days.  Dispense: 4 capsule; Refill: 0  3. Other insomnia The problem of recurrent insomnia was discussed. Orders and follow up as documented in patient record. Counseling: Intensive lifestyle modifications are the first line treatment for this issue. We discussed several lifestyle modifications today and she will continue to work on diet, exercise and weight loss efforts. We will refill Trazodone with a 90 day supply with no refill.   - traZODone (DESYREL) 50 MG tablet; Take 0.5-1 tablets (25-50 mg total) by mouth at bedtime as needed for sleep.  Dispense: 30 tablet; Refill: 0  4. At risk for deficient intake of food Maureen [name] was given approximately 15 minutes of deficit intake of food prevention counseling today. Maureen [name] is at risk for eating too few calories based on current food recall. She was encouraged to focus on meeting caloric and protein goals according to her recommended meal plan.   5. Class 1 obesity with serious comorbidity and body mass index (BMI) of 32.0 to 32.9 in adult, unspecified obesity type Maureen [name] is currently in the action stage of change. As such, her goal is to maintain weight for now. She has agreed to the Category 2 Plan or practicing  portion control and making smarter food choices, such as increasing vegetables and decreasing simple carbohydrates.   Behavioral modification strategies: increasing lean protein intake and decreasing simple carbohydrates.  Maureen [name] has agreed to follow-up with our clinic in 3 to 4 weeks. She was informed of the importance of frequent follow-up visits to maximize her success with intensive lifestyle modifications for  her multiple health conditions.   Objective:   Blood pressure 131/88, pulse 86, temperature 98 F (36.7 C), temperature source Oral, height 5\' 8"  (1.727 m), weight 211 lb (95.7 kg), SpO2 98 %. Body mass index is 32.08 kg/m.  General: Cooperative, alert, well developed, in no acute distress. HEENT: Conjunctivae and lids unremarkable. Cardiovascular: Regular rhythm.  Lungs: Normal work of breathing. Neurologic: No focal deficits.   Lab Results  Component Value Date   CREATININE 0.89 01/08/2019   BUN 16 01/08/2019   NA 137 01/08/2019   K 4.4 01/08/2019   CL 102 01/08/2019   CO2 21 01/08/2019   Lab Results  Component Value Date   ALT 6 01/08/2019   AST 11 01/08/2019   ALKPHOS 65 01/08/2019   BILITOT 0.5 01/08/2019   Lab Results  Component Value Date   HGBA1C 5.0 01/08/2019   HGBA1C 5.3 03/07/2018   Lab Results  Component Value Date   INSULIN 10.0 01/08/2019   INSULIN 16.5 03/07/2018   Lab Results  Component Value Date   TSH 0.528 03/07/2018   Lab Results  Component Value Date   CHOL 227 (H) 01/08/2019   HDL 68 01/08/2019   LDLCALC 138 (H) 01/08/2019   TRIG 119 01/08/2019   Lab Results  Component Value Date   WBC 8.3 03/07/2018   HGB 13.4 03/07/2018   HCT 41.0 03/07/2018   MCV 84 03/07/2018   No results found for: IRON, TIBC, FERRITIN  Attestation Statements:   Reviewed by clinician on day of visit: allergies, medications, problem list, medical history, surgical history, family history, social history, and previous encounter notes.   I, Trixie Dredge, am acting as transcriptionist for Dennard Nip, MD.  I have reviewed the above documentation for accuracy and completeness, and I agree with the above. -  Dennard Nip, MD

## 2019-03-25 ENCOUNTER — Encounter (INDEPENDENT_AMBULATORY_CARE_PROVIDER_SITE_OTHER): Payer: Self-pay | Admitting: Family Medicine

## 2019-03-25 ENCOUNTER — Other Ambulatory Visit: Payer: Self-pay

## 2019-03-25 ENCOUNTER — Ambulatory Visit (INDEPENDENT_AMBULATORY_CARE_PROVIDER_SITE_OTHER): Payer: BC Managed Care – PPO | Admitting: Family Medicine

## 2019-03-25 VITALS — BP 115/73 | HR 62 | Temp 98.5°F | Ht 68.0 in | Wt 208.0 lb

## 2019-03-25 DIAGNOSIS — E669 Obesity, unspecified: Secondary | ICD-10-CM | POA: Diagnosis not present

## 2019-03-25 DIAGNOSIS — G4709 Other insomnia: Secondary | ICD-10-CM | POA: Diagnosis not present

## 2019-03-25 DIAGNOSIS — Z6831 Body mass index (BMI) 31.0-31.9, adult: Secondary | ICD-10-CM

## 2019-03-25 DIAGNOSIS — E8881 Metabolic syndrome: Secondary | ICD-10-CM

## 2019-03-25 DIAGNOSIS — E559 Vitamin D deficiency, unspecified: Secondary | ICD-10-CM

## 2019-03-26 MED ORDER — TRAZODONE HCL 50 MG PO TABS: 25.0000 mg | ORAL_TABLET | Freq: Every evening | ORAL | 0 refills | Status: DC | PRN

## 2019-03-26 MED ORDER — METFORMIN HCL 500 MG PO TABS: 500.0000 mg | ORAL_TABLET | Freq: Every day | ORAL | 0 refills | Status: DC

## 2019-03-26 MED ORDER — VITAMIN D (ERGOCALCIFEROL) 1.25 MG (50000 UNIT) PO CAPS: 50000.0000 [IU] | ORAL_CAPSULE | ORAL | 0 refills | Status: DC

## 2019-03-26 NOTE — Progress Notes (Signed)
Chief Complaint:   OBESITY Maureen [name] is here to discuss her progress with her obesity treatment plan along with follow-up of her obesity related diagnoses. Maureen [name] is on the Category 2 Plan or practicing portion control and making smarter food choices, such as increasing vegetables and decreasing simple carbohydrates and states she is following her eating plan approximately 75% of the time. Maureen [name] states she is walking for 35 minutes 4 times per week.  Today's visit was #: 53 Starting weight: 220 lbs Starting date: 03/07/2018 Today's weight: 208 lbs Today's date: 03/25/2019 Total lbs lost to date: 12 Total lbs lost since last in-office visit: 3  Interim History: Maureen [name] continues to do well with weight loss. She does well with breakfast and lunch, but struggles with dinner meal planning and she is feeling bored.  Subjective:   1. Insulin resistance Maureen [name] is stable on metformin an she is doing well with diet.  2. Other insomnia Maureen [name] is sleeping better on Trazodone, and she notes decreased job stress.  3. Vitamin D deficiency Maureen [name] is stable on Vit D, and she is due for labs soon.  Assessment/Plan:   1. Insulin resistance Maureen [name] will continue to work on weight loss, exercise, and decreasing simple carbohydrates to help decrease the risk of diabetes. We will refill metformin for 1 month, and we will recheck labs at her next visit. Maureen [name] agreed to follow-up with [name] as directed to closely monitor her progress.  - metFORMIN (GLUCOPHAGE) 500 MG tablet; Take 1 tablet (500 mg total) by mouth daily with breakfast.  Dispense: 30 tablet; Refill: 0  2. Other insomnia The problem of recurrent insomnia was discussed. We will refill Trazodone for 1 month. Orders and follow up as documented in patient record. Counseling: Intensive lifestyle modifications are the first line treatment for this issue. We discussed several lifestyle modifications today and she will continue to work on diet,  exercise and weight loss efforts.   - traZODone (DESYREL) 50 MG tablet; Take 0.5-1 tablets (25-50 mg total) by mouth at bedtime as needed for sleep.  Dispense: 30 tablet; Refill: 0  3. Vitamin D deficiency Low Vitamin D level contributes to fatigue and are associated with obesity, breast, and colon cancer. We will refill prescription Vitamin D for 1 month. Maureen [name] will follow-up for routine testing of Vitamin D, at least 2-3 times per year to avoid over-replacement. We will recheck labs at her next visit.  - Vitamin D, Ergocalciferol, (DRISDOL) 1.25 MG (50000 UNIT) CAPS capsule; Take 1 capsule (50,000 Units total) by mouth every 7 (seven) days.  Dispense: 4 capsule; Refill: 0  4. Class 1 obesity with serious comorbidity and body mass index (BMI) of 31.0 to 31.9 in adult, unspecified obesity type Maureen [name] is currently in the action stage of change. As such, her goal is to continue with weight loss efforts. She has agreed to the Category 2 Plan. Dinner plans and options were discussed.   Exercise goals: As is.  Behavioral modification strategies: meal planning and cooking strategies.  Maureen [name] has agreed to follow-up with our clinic in 3 to 4 weeks. She was informed of the importance of frequent follow-up visits to maximize her success with intensive lifestyle modifications for her multiple health conditions.   Objective:   Blood pressure 115/73, pulse 62, temperature 98.5 F (36.9 C), temperature source Oral, height 5\' 8"  (1.727 m), weight 208 lb (94.3 kg), SpO2 99 %. Body mass index is 31.63 kg/m.  General: Cooperative, alert, well developed, in no acute  distress. HEENT: Conjunctivae and lids unremarkable. Cardiovascular: Regular rhythm.  Lungs: Normal work of breathing. Neurologic: No focal deficits.   Lab Results  Component Value Date   CREATININE 0.89 01/08/2019   BUN 16 01/08/2019   NA 137 01/08/2019   K 4.4 01/08/2019   CL 102 01/08/2019   CO2 21 01/08/2019   Lab Results    Component Value Date   ALT 6 01/08/2019   AST 11 01/08/2019   ALKPHOS 65 01/08/2019   BILITOT 0.5 01/08/2019   Lab Results  Component Value Date   HGBA1C 5.0 01/08/2019   HGBA1C 5.3 03/07/2018   Lab Results  Component Value Date   INSULIN 10.0 01/08/2019   INSULIN 16.5 03/07/2018   Lab Results  Component Value Date   TSH 0.528 03/07/2018   Lab Results  Component Value Date   CHOL 227 (H) 01/08/2019   HDL 68 01/08/2019   LDLCALC 138 (H) 01/08/2019   TRIG 119 01/08/2019   Lab Results  Component Value Date   WBC 8.3 03/07/2018   HGB 13.4 03/07/2018   HCT 41.0 03/07/2018   MCV 84 03/07/2018   No results found for: IRON, TIBC, FERRITIN  Attestation Statements:   Reviewed by clinician on day of visit: allergies, medications, problem list, medical history, surgical history, family history, social history, and previous encounter notes.   I, Burt Knack, am acting as transcriptionist for Quillian Quince, MD.  I have reviewed the above documentation for accuracy and completeness, and I agree with the above. -  Quillian Quince, MD

## 2019-04-03 ENCOUNTER — Ambulatory Visit: Payer: BC Managed Care – PPO | Admitting: Professional

## 2019-04-10 ENCOUNTER — Ambulatory Visit: Payer: BC Managed Care – PPO | Admitting: Professional

## 2019-04-16 ENCOUNTER — Ambulatory Visit (INDEPENDENT_AMBULATORY_CARE_PROVIDER_SITE_OTHER): Payer: BC Managed Care – PPO | Admitting: Professional

## 2019-04-16 DIAGNOSIS — R102 Pelvic and perineal pain: Secondary | ICD-10-CM | POA: Diagnosis not present

## 2019-04-16 DIAGNOSIS — F331 Major depressive disorder, recurrent, moderate: Secondary | ICD-10-CM

## 2019-04-16 DIAGNOSIS — F411 Generalized anxiety disorder: Secondary | ICD-10-CM | POA: Diagnosis not present

## 2019-04-16 DIAGNOSIS — N925 Other specified irregular menstruation: Secondary | ICD-10-CM | POA: Diagnosis not present

## 2019-04-18 DIAGNOSIS — E669 Obesity, unspecified: Secondary | ICD-10-CM | POA: Diagnosis not present

## 2019-04-18 DIAGNOSIS — Z1211 Encounter for screening for malignant neoplasm of colon: Secondary | ICD-10-CM | POA: Diagnosis not present

## 2019-04-22 ENCOUNTER — Ambulatory Visit (INDEPENDENT_AMBULATORY_CARE_PROVIDER_SITE_OTHER): Payer: BC Managed Care – PPO | Admitting: Family Medicine

## 2019-04-24 ENCOUNTER — Ambulatory Visit (INDEPENDENT_AMBULATORY_CARE_PROVIDER_SITE_OTHER): Payer: BC Managed Care – PPO | Admitting: Family Medicine

## 2019-05-01 ENCOUNTER — Ambulatory Visit: Payer: BC Managed Care – PPO | Admitting: Professional

## 2019-05-03 DIAGNOSIS — I119 Hypertensive heart disease without heart failure: Secondary | ICD-10-CM | POA: Diagnosis not present

## 2019-05-03 DIAGNOSIS — E039 Hypothyroidism, unspecified: Secondary | ICD-10-CM | POA: Diagnosis not present

## 2019-05-03 DIAGNOSIS — R7301 Impaired fasting glucose: Secondary | ICD-10-CM | POA: Diagnosis not present

## 2019-05-03 DIAGNOSIS — R531 Weakness: Secondary | ICD-10-CM | POA: Diagnosis not present

## 2019-05-09 DIAGNOSIS — J069 Acute upper respiratory infection, unspecified: Secondary | ICD-10-CM | POA: Diagnosis not present

## 2019-05-14 ENCOUNTER — Other Ambulatory Visit: Payer: Self-pay

## 2019-05-14 ENCOUNTER — Encounter (INDEPENDENT_AMBULATORY_CARE_PROVIDER_SITE_OTHER): Payer: Self-pay | Admitting: Family Medicine

## 2019-05-14 ENCOUNTER — Ambulatory Visit (INDEPENDENT_AMBULATORY_CARE_PROVIDER_SITE_OTHER): Payer: BC Managed Care – PPO | Admitting: Family Medicine

## 2019-05-14 VITALS — BP 99/65 | HR 87 | Temp 98.5°F | Ht 68.0 in | Wt 212.0 lb

## 2019-05-14 DIAGNOSIS — Z9189 Other specified personal risk factors, not elsewhere classified: Secondary | ICD-10-CM

## 2019-05-14 DIAGNOSIS — E559 Vitamin D deficiency, unspecified: Secondary | ICD-10-CM

## 2019-05-14 DIAGNOSIS — E8881 Metabolic syndrome: Secondary | ICD-10-CM | POA: Diagnosis not present

## 2019-05-14 DIAGNOSIS — G4709 Other insomnia: Secondary | ICD-10-CM | POA: Diagnosis not present

## 2019-05-14 DIAGNOSIS — E669 Obesity, unspecified: Secondary | ICD-10-CM

## 2019-05-14 DIAGNOSIS — Z6832 Body mass index (BMI) 32.0-32.9, adult: Secondary | ICD-10-CM

## 2019-05-14 MED ORDER — TRAZODONE HCL 50 MG PO TABS: 50.0000 mg | ORAL_TABLET | Freq: Every day | ORAL | 0 refills | Status: DC

## 2019-05-14 MED ORDER — VITAMIN D (ERGOCALCIFEROL) 1.25 MG (50000 UNIT) PO CAPS: 50000.0000 [IU] | ORAL_CAPSULE | ORAL | 0 refills | Status: DC

## 2019-05-15 DIAGNOSIS — I119 Hypertensive heart disease without heart failure: Secondary | ICD-10-CM | POA: Diagnosis not present

## 2019-05-15 LAB — VITAMIN D 25 HYDROXY (VIT D DEFICIENCY, FRACTURES): Vit D, 25-Hydroxy: 77.1 ng/mL (ref 30.0–100.0)

## 2019-05-15 NOTE — Progress Notes (Signed)
Chief Complaint:   OBESITY Maureen [name] is here to discuss her progress with her obesity treatment plan along with follow-up of her obesity related diagnoses. Maureen [name] is on the Category 2 Plan and states she is following her eating plan approximately 25% of the time. Maureen [name] states she is doing 0 minutes 0 times per week.  Today's visit was #: 20 Starting weight: 220 lbs Starting date: 03/07/2018 Today's weight: 212 lbs Today's date: 05/14/2019 Total lbs lost to date: 8 Total lbs lost since last in-office visit: 0  Interim History: Maureen [name] has has health issues over the past few weeks and has been off the plan. She is working on getting back on the plan. She has been eating off the plan at breakfast (raisin bread). She skips breakfast on the weekends.  Subjective:   1. Insulin resistance Maureen [name] denies polyphagia. She struggles to eat at times- especially breakfast.  2. Other insomnia Maureen [name] reports her sleep is better when she rests after work before working on her homework. She does not feel rested in the morning. She denies snoring.  3. Vitamin D deficiency Maureen [name] notes fatigue. Last Vit D level was 32. She is on weekly prescription Vit D.  4. At risk for osteoporosis Maureen [name] is at higher risk of osteopenia and osteoporosis due to Vitamin D deficiency.   Assessment/Plan:   1. Insulin resistance Alexiz will continue to work on weight loss, exercise, and decreasing simple carbohydrates to help decrease the risk of diabetes.  Jacqulin agreed to discontinue metformin, and she agreed to follow-up with [name] as directed to closely monitor her progress.  2. Other insomnia The problem of recurrent insomnia was discussed. Orders and follow up as documented in patient record. Counseling: Intensive lifestyle modifications are the first line treatment for this issue. We discussed several lifestyle modifications today and she will continue to work on diet, exercise and weight loss efforts. We will  refill Trazodone for 1 month.  - traZODone (DESYREL) 50 MG tablet; Take 1 tablet (50 mg total) by mouth at bedtime.  Dispense: 30 tablet; Refill: 0  3. Vitamin D deficiency Low Vitamin D level contributes to fatigue and are associated with obesity, breast, and colon cancer. We will refill prescription Vitamin D for 1 month. Lina will follow-up for routine testing of Vitamin D, at least 2-3 times per year to avoid over-replacement. We will check labs today.  - Vitamin D, Ergocalciferol, (DRISDOL) 1.25 MG (50000 UNIT) CAPS capsule; Take 1 capsule (50,000 Units total) by mouth every 7 (seven) days.  Dispense: 4 capsule; Refill: 0  - VITAMIN D 25 Hydroxy (Vit-D Deficiency, Fractures)  4. At risk for osteoporosis Maureen [name] was given approximately 15 minutes of osteoporosis prevention counseling today. Maureen [name] is at risk for osteopenia and osteoporosis due to her Vitamin D deficiency. She was encouraged to take her Vitamin D and follow her higher calcium diet and increase strengthening exercise to help strengthen her bones and decrease her risk of osteopenia and osteoporosis.  Repetitive spaced learning was employed today to elicit superior memory formation and behavioral change.  5. Class 1 obesity with serious comorbidity and body mass index (BMI) of 32.0 to 32.9 in adult, unspecified obesity type Maureen [name] is currently in the action stage of change. As such, her goal is to continue with weight loss efforts. She has agreed to the Category 2 Plan and keeping a food journal and adhering to recommended goals of 200-300 calories and 20 grams of protein at breakfast daily.  Maureen [name] may journaling for breakfast. Handouts given today: Journaling and Breakfast options.  Exercise goals: No exercise has been prescribed at this time.  Behavioral modification strategies: increasing lean protein intake, decreasing simple carbohydrates, meal planning and cooking strategies and better snacking choices.  Maureen [name] has  agreed to follow-up with our clinic in 3 to 4 weeks. She was informed of the importance of frequent follow-up visits to maximize her success with intensive lifestyle modifications for her multiple health conditions.   Analyce was informed we would discuss her lab results at her next visit unless there is a critical issue that needs to be addressed sooner. Maureen [name] agreed to keep her next visit at the agreed upon time to discuss these results.  Objective:   Blood pressure 99/65, pulse 87, temperature 98.5 F (36.9 C), temperature source Oral, height 5\' 8"  (1.727 m), weight 212 lb (96.2 kg), SpO2 97 %. Body mass index is 32.23 kg/m.  General: Cooperative, alert, well developed, in no acute distress. HEENT: Conjunctivae and lids unremarkable. Cardiovascular: Regular rhythm.  Lungs: Normal work of breathing. Neurologic: No focal deficits.   Lab Results  Component Value Date   CREATININE 0.89 01/08/2019   BUN 16 01/08/2019   NA 137 01/08/2019   K 4.4 01/08/2019   CL 102 01/08/2019   CO2 21 01/08/2019   Lab Results  Component Value Date   ALT 6 01/08/2019   AST 11 01/08/2019   ALKPHOS 65 01/08/2019   BILITOT 0.5 01/08/2019   Lab Results  Component Value Date   HGBA1C 5.0 01/08/2019   HGBA1C 5.3 03/07/2018   Lab Results  Component Value Date   INSULIN 10.0 01/08/2019   INSULIN 16.5 03/07/2018   Lab Results  Component Value Date   TSH 0.528 03/07/2018   Lab Results  Component Value Date   CHOL 227 (H) 01/08/2019   HDL 68 01/08/2019   LDLCALC 138 (H) 01/08/2019   TRIG 119 01/08/2019   Lab Results  Component Value Date   WBC 8.3 03/07/2018   HGB 13.4 03/07/2018   HCT 41.0 03/07/2018   MCV 84 03/07/2018   No results found for: IRON, TIBC, FERRITIN  Attestation Statements:   Reviewed by clinician on day of visit: allergies, medications, problem list, medical history, surgical history, family history, social history, and previous encounter notes.   03/09/2018, am acting as Trude Mcburney for Energy manager, FNP-C.  I have reviewed the above documentation for accuracy and completeness, and I agree with the above. -  [place], FNP

## 2019-05-16 ENCOUNTER — Encounter (INDEPENDENT_AMBULATORY_CARE_PROVIDER_SITE_OTHER): Payer: Self-pay | Admitting: Family Medicine

## 2019-05-20 ENCOUNTER — Other Ambulatory Visit (INDEPENDENT_AMBULATORY_CARE_PROVIDER_SITE_OTHER): Payer: Self-pay | Admitting: Family Medicine

## 2019-05-20 DIAGNOSIS — N939 Abnormal uterine and vaginal bleeding, unspecified: Secondary | ICD-10-CM | POA: Diagnosis not present

## 2019-05-22 ENCOUNTER — Ambulatory Visit: Payer: BC Managed Care – PPO | Admitting: Professional

## 2019-05-27 ENCOUNTER — Ambulatory Visit: Payer: BC Managed Care – PPO | Admitting: Professional

## 2019-06-03 DIAGNOSIS — D120 Benign neoplasm of cecum: Secondary | ICD-10-CM | POA: Diagnosis not present

## 2019-06-03 DIAGNOSIS — Z1211 Encounter for screening for malignant neoplasm of colon: Secondary | ICD-10-CM | POA: Diagnosis not present

## 2019-06-03 DIAGNOSIS — K635 Polyp of colon: Secondary | ICD-10-CM | POA: Diagnosis not present

## 2019-06-03 DIAGNOSIS — K5730 Diverticulosis of large intestine without perforation or abscess without bleeding: Secondary | ICD-10-CM | POA: Diagnosis not present

## 2019-06-05 ENCOUNTER — Ambulatory Visit (INDEPENDENT_AMBULATORY_CARE_PROVIDER_SITE_OTHER): Payer: BC Managed Care – PPO | Admitting: Family Medicine

## 2019-06-05 ENCOUNTER — Encounter (INDEPENDENT_AMBULATORY_CARE_PROVIDER_SITE_OTHER): Payer: Self-pay | Admitting: Family Medicine

## 2019-06-05 ENCOUNTER — Other Ambulatory Visit: Payer: Self-pay

## 2019-06-05 VITALS — BP 127/80 | HR 79 | Temp 98.3°F | Ht 68.0 in | Wt 219.0 lb

## 2019-06-05 DIAGNOSIS — Z6833 Body mass index (BMI) 33.0-33.9, adult: Secondary | ICD-10-CM

## 2019-06-05 DIAGNOSIS — E669 Obesity, unspecified: Secondary | ICD-10-CM | POA: Diagnosis not present

## 2019-06-05 DIAGNOSIS — E559 Vitamin D deficiency, unspecified: Secondary | ICD-10-CM | POA: Diagnosis not present

## 2019-06-05 DIAGNOSIS — E66811 Obesity, class 1: Secondary | ICD-10-CM

## 2019-06-05 DIAGNOSIS — G4709 Other insomnia: Secondary | ICD-10-CM | POA: Diagnosis not present

## 2019-06-05 DIAGNOSIS — Z9189 Other specified personal risk factors, not elsewhere classified: Secondary | ICD-10-CM | POA: Diagnosis not present

## 2019-06-05 MED ORDER — TRAZODONE HCL 50 MG PO TABS: 50.0000 mg | ORAL_TABLET | Freq: Every day | ORAL | 0 refills | Status: DC

## 2019-06-05 NOTE — Progress Notes (Signed)
Chief Complaint:   OBESITY Maureen [name] is here to discuss her progress with her obesity treatment plan along with follow-up of her obesity related diagnoses. Maureen [name] is on the Category 2 Plan and keeping a food journal and adhering to recommended goals of 200-300 calories and 20 grams of proteinat breakfast daily and states she is following her eating plan approximately 75% of the time. Maureen [name] states she is doing 0 minutes 0 times per week.  Today's visit was #: 21 Starting weight: 220 lbs Starting date: 03/07/2018 Today's weight: 219 lbs Today's date: 06/05/2019 Total lbs lost to date: 1 Total lbs lost since last in-office visit: 0  Interim History: Maureen [name] does eat out quite often at lunch. She also had a recent colonoscopy and drank full sugar 7Up for several days. She is up several lbs of water per our bioimpedance scale. She is getting all of her protein in and she is now back on the plan.  Subjective:   1. Other insomnia Maureen [name] reports her sleep is good overall with trazodone.  2. Vitamin D deficiency Maureen [name]'s last Vit D level is now at goal at 77. She has been on prescription Vit D.  3. At risk for osteoporosis Maureen [name] is at higher risk of osteopenia and osteoporosis due to Vitamin D deficiency.   Assessment/Plan:   1. Other insomnia The problem of recurrent insomnia was discussed. Maureen [name] agreed to continue trazodone at 50 mg daily and we will refill for 1 month. Orders and follow up as documented in patient record.   - traZODone (DESYREL) 50 MG tablet; Take 1 tablet (50 mg total) by mouth at bedtime.  Dispense: 90 tablet; Refill: 0  2. Vitamin D deficiency Low Vitamin D level contributes to fatigue and are associated with obesity, breast, and colon cancer. Maureen [name] agreed to discontinue prescription Vitamin D, and will start OTC Vit D 5,000 IU daily. She will follow-up for routine testing of Vitamin D, at least 2-3 times per year to avoid over-replacement.  3. At risk for  osteoporosis Maureen [name] was given approximately 15 minutes of osteoporosis prevention counseling today. Maureen [name] is at risk for osteopenia and osteoporosis due to her Vitamin D deficiency. She was encouraged to take her Vitamin D and follow her higher calcium diet and increase strengthening exercise to help strengthen her bones and decrease her risk of osteopenia and osteoporosis.  Repetitive spaced learning was employed today to elicit superior memory formation and behavioral change.  4. Class 1 obesity with serious comorbidity and body mass index (BMI) of 33.0 to 33.9 in adult, unspecified obesity type Maureen [name] is currently in the action stage of change. As such, her goal is to continue with weight loss efforts. She has agreed to the Category 2 Plan.   Exercise goals: All adults should avoid inactivity. Some physical activity is better than none, and adults who participate in any amount of physical activity gain some health benefits.  Behavioral modification strategies: decreasing eating out and planning for success.  Maureen [name] has agreed to follow-up with our clinic in 3 weeks. She was informed of the importance of frequent follow-up visits to maximize her success with intensive lifestyle modifications for her multiple health conditions.   Objective:   Blood pressure 127/80, pulse 79, temperature 98.3 F (36.8 C), temperature source Oral, height 5\' 8"  (1.727 m), weight 219 lb (99.3 kg), SpO2 100 %. Body mass index is 33.3 kg/m.  General: Cooperative, alert, well developed, in no acute distress. HEENT: Conjunctivae and lids unremarkable. Cardiovascular:  Regular rhythm.  Lungs: Normal work of breathing. Neurologic: No focal deficits.   Lab Results  Component Value Date   CREATININE 0.89 01/08/2019   BUN 16 01/08/2019   NA 137 01/08/2019   K 4.4 01/08/2019   CL 102 01/08/2019   CO2 21 01/08/2019   Lab Results  Component Value Date   ALT 6 01/08/2019   AST 11 01/08/2019   ALKPHOS 65  01/08/2019   BILITOT 0.5 01/08/2019   Lab Results  Component Value Date   HGBA1C 5.0 01/08/2019   HGBA1C 5.3 03/07/2018   Lab Results  Component Value Date   INSULIN 10.0 01/08/2019   INSULIN 16.5 03/07/2018   Lab Results  Component Value Date   TSH 0.528 03/07/2018   Lab Results  Component Value Date   CHOL 227 (H) 01/08/2019   HDL 68 01/08/2019   LDLCALC 138 (H) 01/08/2019   TRIG 119 01/08/2019   Lab Results  Component Value Date   WBC 8.3 03/07/2018   HGB 13.4 03/07/2018   HCT 41.0 03/07/2018   MCV 84 03/07/2018   No results found for: IRON, TIBC, FERRITIN  Attestation Statements:   Reviewed by clinician on day of visit: allergies, medications, problem list, medical history, surgical history, family history, social history, and previous encounter notes.   Wilhemena Durie, am acting as Location manager for [employer], FNP-C.  I have reviewed the above documentation for accuracy and completeness, and I agree with the above. -  Georgianne Fick, FNP

## 2019-06-12 ENCOUNTER — Ambulatory Visit: Payer: BC Managed Care – PPO | Admitting: Professional

## 2019-06-19 ENCOUNTER — Ambulatory Visit (INDEPENDENT_AMBULATORY_CARE_PROVIDER_SITE_OTHER): Payer: BC Managed Care – PPO | Admitting: Professional

## 2019-06-19 DIAGNOSIS — F331 Major depressive disorder, recurrent, moderate: Secondary | ICD-10-CM | POA: Diagnosis not present

## 2019-06-19 DIAGNOSIS — F411 Generalized anxiety disorder: Secondary | ICD-10-CM | POA: Diagnosis not present

## 2019-06-24 ENCOUNTER — Ambulatory Visit (INDEPENDENT_AMBULATORY_CARE_PROVIDER_SITE_OTHER): Payer: BC Managed Care – PPO | Admitting: Professional

## 2019-06-24 DIAGNOSIS — F331 Major depressive disorder, recurrent, moderate: Secondary | ICD-10-CM

## 2019-06-24 DIAGNOSIS — F411 Generalized anxiety disorder: Secondary | ICD-10-CM

## 2019-07-01 ENCOUNTER — Ambulatory Visit (INDEPENDENT_AMBULATORY_CARE_PROVIDER_SITE_OTHER): Payer: BC Managed Care – PPO | Admitting: Family Medicine

## 2019-07-01 ENCOUNTER — Encounter (INDEPENDENT_AMBULATORY_CARE_PROVIDER_SITE_OTHER): Payer: Self-pay | Admitting: Family Medicine

## 2019-07-01 ENCOUNTER — Other Ambulatory Visit: Payer: Self-pay

## 2019-07-01 VITALS — BP 120/74 | HR 84 | Temp 98.5°F | Ht 68.0 in | Wt 217.0 lb

## 2019-07-01 DIAGNOSIS — Z6833 Body mass index (BMI) 33.0-33.9, adult: Secondary | ICD-10-CM | POA: Diagnosis not present

## 2019-07-01 DIAGNOSIS — G4709 Other insomnia: Secondary | ICD-10-CM | POA: Diagnosis not present

## 2019-07-01 DIAGNOSIS — I10 Essential (primary) hypertension: Secondary | ICD-10-CM | POA: Diagnosis not present

## 2019-07-01 DIAGNOSIS — E669 Obesity, unspecified: Secondary | ICD-10-CM | POA: Diagnosis not present

## 2019-07-01 DIAGNOSIS — E66811 Obesity, class 1: Secondary | ICD-10-CM

## 2019-07-01 MED ORDER — TRAZODONE HCL 50 MG PO TABS: 50.0000 mg | ORAL_TABLET | Freq: Every day | ORAL | 0 refills | Status: AC

## 2019-07-01 NOTE — Progress Notes (Addendum)
Chief Complaint:   OBESITY Maureen [name] is here to discuss her progress with her obesity treatment plan along with follow-up of her obesity related diagnoses. Salvatore is on the Category 2 Plan and states she is following her eating plan approximately 50-75% of the time. Maureen [name] states she is walking 45-60 minutes 1 time per week.  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 She is off plan on the weekends due to lack of planning and tends to snack rather than eating meals.  Subjective:   Other insomnia. Maureen [name] reports sleeping well with Trazodone.  Essential hypertension. Blood pressure is well controlled on Norvasc.  BP Readings from Last 3 Encounters:  07/01/19 120/74  06/05/19 127/80  05/14/19 99/65   Lab Results  Component Value Date   CREATININE 0.89 01/08/2019   CREATININE 0.91 03/07/2018   Assessment/Plan:   Other insomnia. The problem of recurrent insomnia was discussed. Orders and follow up as documented in patient record. Counseling: Intensive lifestyle modifications are the first line treatment for this issue. We discussed several lifestyle modifications today and she will continue to work on diet, exercise and weight loss efforts. Refill was given for traZODone (DESYREL) 50 MG tablet QHS #90 with 0 refills.   Essential hypertension. Maureen [name] is working on healthy weight loss and exercise to improve blood pressure control. We will watch for signs of hypotension as she continues her lifestyle modifications. She will continue Norvasc as directed.  Class 1 obesity with serious comorbidity and body mass index (BMI) of 33.0 to 33.9 in adult, unspecified obesity type.  Maureen [name] is currently in the  action stage of change. As such, her goal is to continue with weight loss efforts. She has agreed to the Category 2 Plan.   Exercise goals: Maureen [name] will increase exercise and add resistance training.  Behavioral modification strategies: increasing lean protein intake, increasing water intake, meal planning and cooking strategies, keeping healthy foods in the home, avoiding temptations and planning for success.  Maureen [name] has agreed to follow-up with our clinic in 3 weeks. She was informed of the importance of frequent follow-up visits to maximize her success with intensive lifestyle modifications for her multiple health conditions.   Objective:   Blood pressure 120/74, pulse 84, temperature 98.5 F (36.9 C), temperature source Oral, height 5\' 8"  (1.727 m), weight 217 lb (98.4 kg), SpO2 99 %. Body mass index is 32.99 kg/m.  General: Cooperative, alert, well developed, in no acute distress. HEENT: Conjunctivae and lids unremarkable. Cardiovascular: Regular rhythm.  Lungs: Normal work of breathing. Neurologic: No focal deficits.   Lab Results  Component Value Date   CREATININE 0.89 01/08/2019   BUN 16 01/08/2019   NA 137 01/08/2019   K 4.4 01/08/2019   CL 102 01/08/2019   CO2 21 01/08/2019   Lab Results  Component Value Date   ALT 6 01/08/2019   AST 11 01/08/2019   ALKPHOS 65 01/08/2019   BILITOT 0.5 01/08/2019   Lab Results  Component Value Date   HGBA1C 5.0 01/08/2019   HGBA1C 5.3 03/07/2018   Lab Results  Component Value Date   INSULIN 10.0 01/08/2019   INSULIN 16.5 03/07/2018   Lab Results  Component Value Date   TSH 0.528  03/07/2018   Lab Results  Component Value Date   CHOL 227 (H) 01/08/2019   HDL 68 01/08/2019   LDLCALC 138 (H) 01/08/2019   TRIG 119 01/08/2019   Lab Results  Component Value Date   WBC 8.3 03/07/2018   HGB 13.4 03/07/2018   HCT 41.0 03/07/2018   MCV 84 03/07/2018   Attestation Statements:   Reviewed by clinician on day of visit:  allergies, medications, problem list, medical history, surgical history, family history, social history, and previous encounter notes.  Maureen [name], am acting as Location manager for [employer], FNP   I have reviewed the above documentation for accuracy and completeness, and I agree with the above. -  Maureen Fick, FNP

## 2019-07-08 ENCOUNTER — Ambulatory Visit (INDEPENDENT_AMBULATORY_CARE_PROVIDER_SITE_OTHER): Payer: BC Managed Care – PPO | Admitting: Professional

## 2019-07-08 DIAGNOSIS — F411 Generalized anxiety disorder: Secondary | ICD-10-CM | POA: Diagnosis not present

## 2019-07-08 DIAGNOSIS — F331 Major depressive disorder, recurrent, moderate: Secondary | ICD-10-CM | POA: Diagnosis not present

## 2019-07-15 ENCOUNTER — Ambulatory Visit: Payer: BC Managed Care – PPO | Admitting: Professional

## 2019-07-18 ENCOUNTER — Encounter (INDEPENDENT_AMBULATORY_CARE_PROVIDER_SITE_OTHER): Payer: Self-pay

## 2019-07-23 ENCOUNTER — Other Ambulatory Visit: Payer: Self-pay

## 2019-07-23 ENCOUNTER — Ambulatory Visit (INDEPENDENT_AMBULATORY_CARE_PROVIDER_SITE_OTHER): Payer: BC Managed Care – PPO | Admitting: Family Medicine

## 2019-07-23 ENCOUNTER — Encounter (INDEPENDENT_AMBULATORY_CARE_PROVIDER_SITE_OTHER): Payer: Self-pay | Admitting: Family Medicine

## 2019-07-23 VITALS — BP 109/74 | HR 69 | Temp 98.1°F | Ht 68.0 in | Wt 216.0 lb

## 2019-07-23 DIAGNOSIS — E669 Obesity, unspecified: Secondary | ICD-10-CM | POA: Diagnosis not present

## 2019-07-23 DIAGNOSIS — Z6832 Body mass index (BMI) 32.0-32.9, adult: Secondary | ICD-10-CM

## 2019-07-23 DIAGNOSIS — E8881 Metabolic syndrome: Secondary | ICD-10-CM

## 2019-07-23 DIAGNOSIS — I10 Essential (primary) hypertension: Secondary | ICD-10-CM | POA: Diagnosis not present

## 2019-07-24 ENCOUNTER — Ambulatory Visit: Payer: BC Managed Care – PPO | Admitting: Professional

## 2019-07-24 NOTE — Progress Notes (Signed)
Chief Complaint:   OBESITY Maureen [name] is here to discuss her progress with her obesity treatment plan along with follow-up of her obesity related diagnoses. Maureen [name] is on the Category 2 Plan and states she is following her eating plan approximately 80% of the time. Maureen [name] states she is doing 0 minutes 0 times per week.  Today's visit was #: 23 Starting weight: 220 lbs Starting date: 03/07/2018 Today's weight: 216 lbs Today's date: 07/23/2019 Total lbs lost to date: 4 Total lbs lost since last in-office visit: 1  Interim History: Maureen [name] does better on the plan at work than at home. She is now doing better on the weekends due to meal planning. She notes she has not been exercising.  Subjective:   1. Insulin resistance Maureen [name] has a diagnosis of insulin resistance based on her elevated fasting insulin level >5. She denies polyphagia. She has to remember to eat. She is not on metformin. She continues to work on diet and exercise to decrease her risk of diabetes.  Lab Results  Component Value Date   INSULIN 10.0 01/08/2019   INSULIN 16.5 03/07/2018   Lab Results  Component Value Date   HGBA1C 5.0 01/08/2019   2. Essential hypertension Maureen [name]'s blood pressure is well controlled with Norvasc. Cardiovascular ROS: no chest pain or dyspnea on exertion.  BP Readings from Last 3 Encounters:  07/23/19 109/74  07/01/19 120/74  06/05/19 127/80   Lab Results  Component Value Date   CREATININE 0.89 01/08/2019   CREATININE 0.91 03/07/2018   Assessment/Plan:   1. Insulin resistance [name] will continue her meal plan, and will continue to work on weight loss, exercise, and decreasing simple carbohydrates to help decrease the risk of diabetes.  Maureen [name] agreed to follow-up with [name] as directed to closely monitor her progress.  2. Essential hypertension Maureen [name] will continue Norvasc, and will copntinue working on healthy weight loss and exercise to improve blood pressure control. We will watch  for signs of hypotension as she continues her lifestyle modifications.  3. Class 1 obesity with serious comorbidity and body mass index (BMI) of 32.0 to 32.9 in adult, unspecified obesity type Maureen [name] is currently in the action stage of change. As such, her goal is to continue with weight loss efforts. She has agreed to the Category 2 Plan.   Exercise goals: Maureen [name] is to walk 15 minutes 3 times per week.  Behavioral modification strategies: increasing lean protein intake and decreasing simple carbohydrates.  Maureen [name] has agreed to follow-up with our clinic in 4 weeks. She was informed of the importance of frequent follow-up visits to maximize her success with intensive lifestyle modifications for her multiple health conditions.   Objective:   Blood pressure 109/74, pulse 69, temperature 98.1 F (36.7 C), temperature source Oral, height 5\' 8"  (1.727 m), weight 216 lb (98 kg), SpO2 98 %. Body mass index is 32.84 kg/m.  General: Cooperative, alert, well developed, in no acute distress. HEENT: Conjunctivae and lids unremarkable. Cardiovascular: Regular rhythm.  Lungs: Normal work of breathing. Neurologic: No focal deficits.   Lab Results  Component Value Date   CREATININE 0.89 01/08/2019   BUN 16 01/08/2019   NA 137 01/08/2019   K 4.4 01/08/2019   CL 102 01/08/2019   CO2 21 01/08/2019   Lab Results  Component Value Date   ALT 6 01/08/2019   AST 11 01/08/2019   ALKPHOS 65 01/08/2019   BILITOT 0.5 01/08/2019   Lab Results  Component Value Date   HGBA1C  5.0 01/08/2019   HGBA1C 5.3 03/07/2018   Lab Results  Component Value Date   INSULIN 10.0 01/08/2019   INSULIN 16.5 03/07/2018   Lab Results  Component Value Date   TSH 0.528 03/07/2018   Lab Results  Component Value Date   CHOL 227 (H) 01/08/2019   HDL 68 01/08/2019   LDLCALC 138 (H) 01/08/2019   TRIG 119 01/08/2019   Lab Results  Component Value Date   WBC 8.3 03/07/2018   HGB 13.4 03/07/2018   HCT 41.0  03/07/2018   MCV 84 03/07/2018   No results found for: IRON, TIBC, FERRITIN  Attestation Statements:   Reviewed by clinician on day of visit: allergies, medications, problem list, medical history, surgical history, family history, social history, and previous encounter notes.   Trude Mcburney, am acting as Energy manager for [place], FNP-C.  I have reviewed the above documentation for accuracy and completeness, and I agree with the above. -  Jesse Sans, FNP

## 2019-07-25 ENCOUNTER — Encounter (INDEPENDENT_AMBULATORY_CARE_PROVIDER_SITE_OTHER): Payer: Self-pay | Admitting: Family Medicine

## 2019-07-26 DIAGNOSIS — R7301 Impaired fasting glucose: Secondary | ICD-10-CM | POA: Diagnosis not present

## 2019-07-26 DIAGNOSIS — E039 Hypothyroidism, unspecified: Secondary | ICD-10-CM | POA: Diagnosis not present

## 2019-07-26 DIAGNOSIS — I119 Hypertensive heart disease without heart failure: Secondary | ICD-10-CM | POA: Diagnosis not present

## 2019-07-26 DIAGNOSIS — Z6833 Body mass index (BMI) 33.0-33.9, adult: Secondary | ICD-10-CM | POA: Diagnosis not present

## 2019-07-29 ENCOUNTER — Ambulatory Visit (INDEPENDENT_AMBULATORY_CARE_PROVIDER_SITE_OTHER): Payer: BC Managed Care – PPO | Admitting: Professional

## 2019-07-29 DIAGNOSIS — F331 Major depressive disorder, recurrent, moderate: Secondary | ICD-10-CM

## 2019-07-29 DIAGNOSIS — F411 Generalized anxiety disorder: Secondary | ICD-10-CM

## 2019-07-31 ENCOUNTER — Ambulatory Visit: Payer: BC Managed Care – PPO | Admitting: Professional

## 2019-08-14 ENCOUNTER — Ambulatory Visit: Payer: BC Managed Care – PPO | Admitting: Professional

## 2019-08-20 ENCOUNTER — Ambulatory Visit (INDEPENDENT_AMBULATORY_CARE_PROVIDER_SITE_OTHER): Payer: BC Managed Care – PPO | Admitting: Family Medicine

## 2019-08-21 ENCOUNTER — Ambulatory Visit (INDEPENDENT_AMBULATORY_CARE_PROVIDER_SITE_OTHER): Payer: BC Managed Care – PPO | Admitting: Professional

## 2019-08-21 DIAGNOSIS — F331 Major depressive disorder, recurrent, moderate: Secondary | ICD-10-CM | POA: Diagnosis not present

## 2019-08-21 DIAGNOSIS — F411 Generalized anxiety disorder: Secondary | ICD-10-CM

## 2019-08-29 DIAGNOSIS — N921 Excessive and frequent menstruation with irregular cycle: Secondary | ICD-10-CM | POA: Diagnosis not present

## 2019-08-29 DIAGNOSIS — N939 Abnormal uterine and vaginal bleeding, unspecified: Secondary | ICD-10-CM | POA: Diagnosis not present

## 2019-09-04 ENCOUNTER — Ambulatory Visit: Payer: BC Managed Care – PPO | Admitting: Professional

## 2019-09-10 ENCOUNTER — Other Ambulatory Visit (INDEPENDENT_AMBULATORY_CARE_PROVIDER_SITE_OTHER): Payer: Self-pay | Admitting: Family Medicine

## 2019-09-10 DIAGNOSIS — G4709 Other insomnia: Secondary | ICD-10-CM

## 2019-09-17 ENCOUNTER — Ambulatory Visit (INDEPENDENT_AMBULATORY_CARE_PROVIDER_SITE_OTHER): Payer: BC Managed Care – PPO | Admitting: Professional

## 2019-09-17 DIAGNOSIS — F411 Generalized anxiety disorder: Secondary | ICD-10-CM | POA: Diagnosis not present

## 2019-09-17 DIAGNOSIS — F331 Major depressive disorder, recurrent, moderate: Secondary | ICD-10-CM | POA: Diagnosis not present

## 2019-09-20 DIAGNOSIS — I119 Hypertensive heart disease without heart failure: Secondary | ICD-10-CM | POA: Diagnosis not present

## 2019-09-20 DIAGNOSIS — F418 Other specified anxiety disorders: Secondary | ICD-10-CM | POA: Diagnosis not present

## 2019-10-15 ENCOUNTER — Ambulatory Visit: Payer: BC Managed Care – PPO | Admitting: Professional

## 2019-11-04 ENCOUNTER — Ambulatory Visit (INDEPENDENT_AMBULATORY_CARE_PROVIDER_SITE_OTHER): Payer: BC Managed Care – PPO | Admitting: Professional

## 2019-11-04 DIAGNOSIS — F331 Major depressive disorder, recurrent, moderate: Secondary | ICD-10-CM

## 2019-11-04 DIAGNOSIS — F411 Generalized anxiety disorder: Secondary | ICD-10-CM | POA: Diagnosis not present

## 2019-11-12 ENCOUNTER — Other Ambulatory Visit: Payer: Self-pay | Admitting: Obstetrics and Gynecology

## 2019-11-12 DIAGNOSIS — Z1231 Encounter for screening mammogram for malignant neoplasm of breast: Secondary | ICD-10-CM

## 2019-11-16 ENCOUNTER — Ambulatory Visit (INDEPENDENT_AMBULATORY_CARE_PROVIDER_SITE_OTHER): Payer: BC Managed Care – PPO | Admitting: Professional

## 2019-11-16 DIAGNOSIS — F411 Generalized anxiety disorder: Secondary | ICD-10-CM | POA: Diagnosis not present

## 2019-11-16 DIAGNOSIS — F331 Major depressive disorder, recurrent, moderate: Secondary | ICD-10-CM | POA: Diagnosis not present

## 2019-11-30 ENCOUNTER — Ambulatory Visit (INDEPENDENT_AMBULATORY_CARE_PROVIDER_SITE_OTHER): Payer: BC Managed Care – PPO | Admitting: Professional

## 2019-11-30 DIAGNOSIS — F411 Generalized anxiety disorder: Secondary | ICD-10-CM | POA: Diagnosis not present

## 2019-11-30 DIAGNOSIS — F331 Major depressive disorder, recurrent, moderate: Secondary | ICD-10-CM | POA: Diagnosis not present

## 2019-12-28 ENCOUNTER — Ambulatory Visit (INDEPENDENT_AMBULATORY_CARE_PROVIDER_SITE_OTHER): Payer: BC Managed Care – PPO | Admitting: Professional

## 2019-12-28 DIAGNOSIS — F411 Generalized anxiety disorder: Secondary | ICD-10-CM | POA: Diagnosis not present

## 2019-12-28 DIAGNOSIS — F331 Major depressive disorder, recurrent, moderate: Secondary | ICD-10-CM | POA: Diagnosis not present

## 2020-01-06 ENCOUNTER — Ambulatory Visit (INDEPENDENT_AMBULATORY_CARE_PROVIDER_SITE_OTHER): Payer: BC Managed Care – PPO | Admitting: Professional

## 2020-01-06 DIAGNOSIS — F411 Generalized anxiety disorder: Secondary | ICD-10-CM

## 2020-01-06 DIAGNOSIS — F331 Major depressive disorder, recurrent, moderate: Secondary | ICD-10-CM

## 2020-01-08 ENCOUNTER — Ambulatory Visit: Payer: Self-pay

## 2020-01-23 ENCOUNTER — Ambulatory Visit (INDEPENDENT_AMBULATORY_CARE_PROVIDER_SITE_OTHER): Payer: BC Managed Care – PPO | Admitting: Professional

## 2020-01-23 DIAGNOSIS — F411 Generalized anxiety disorder: Secondary | ICD-10-CM

## 2020-01-23 DIAGNOSIS — F331 Major depressive disorder, recurrent, moderate: Secondary | ICD-10-CM | POA: Diagnosis not present

## 2020-02-08 ENCOUNTER — Ambulatory Visit (INDEPENDENT_AMBULATORY_CARE_PROVIDER_SITE_OTHER): Payer: BC Managed Care – PPO | Admitting: Professional

## 2020-02-08 DIAGNOSIS — F331 Major depressive disorder, recurrent, moderate: Secondary | ICD-10-CM

## 2020-02-08 DIAGNOSIS — F411 Generalized anxiety disorder: Secondary | ICD-10-CM | POA: Diagnosis not present

## 2020-02-22 ENCOUNTER — Ambulatory Visit (INDEPENDENT_AMBULATORY_CARE_PROVIDER_SITE_OTHER): Payer: BC Managed Care – PPO | Admitting: Professional

## 2020-02-22 DIAGNOSIS — F411 Generalized anxiety disorder: Secondary | ICD-10-CM

## 2020-02-22 DIAGNOSIS — F331 Major depressive disorder, recurrent, moderate: Secondary | ICD-10-CM

## 2020-03-07 ENCOUNTER — Ambulatory Visit (INDEPENDENT_AMBULATORY_CARE_PROVIDER_SITE_OTHER): Payer: BC Managed Care – PPO | Admitting: Professional

## 2020-03-07 DIAGNOSIS — F331 Major depressive disorder, recurrent, moderate: Secondary | ICD-10-CM

## 2020-03-07 DIAGNOSIS — F411 Generalized anxiety disorder: Secondary | ICD-10-CM

## 2020-03-21 ENCOUNTER — Ambulatory Visit (INDEPENDENT_AMBULATORY_CARE_PROVIDER_SITE_OTHER): Payer: BC Managed Care – PPO | Admitting: Professional

## 2020-03-21 DIAGNOSIS — F331 Major depressive disorder, recurrent, moderate: Secondary | ICD-10-CM

## 2020-03-21 DIAGNOSIS — F411 Generalized anxiety disorder: Secondary | ICD-10-CM

## 2020-04-04 ENCOUNTER — Ambulatory Visit: Payer: BC Managed Care – PPO | Admitting: Professional

## 2020-04-18 ENCOUNTER — Ambulatory Visit (INDEPENDENT_AMBULATORY_CARE_PROVIDER_SITE_OTHER): Payer: BC Managed Care – PPO | Admitting: Professional

## 2020-04-18 DIAGNOSIS — F331 Major depressive disorder, recurrent, moderate: Secondary | ICD-10-CM

## 2020-04-18 DIAGNOSIS — F411 Generalized anxiety disorder: Secondary | ICD-10-CM

## 2020-05-02 ENCOUNTER — Ambulatory Visit: Payer: BC Managed Care – PPO | Admitting: Professional

## 2020-05-23 ENCOUNTER — Ambulatory Visit (INDEPENDENT_AMBULATORY_CARE_PROVIDER_SITE_OTHER): Payer: BC Managed Care – PPO | Admitting: Professional

## 2020-05-23 DIAGNOSIS — F411 Generalized anxiety disorder: Secondary | ICD-10-CM | POA: Diagnosis not present

## 2020-05-23 DIAGNOSIS — F331 Major depressive disorder, recurrent, moderate: Secondary | ICD-10-CM | POA: Diagnosis not present

## 2020-06-20 ENCOUNTER — Ambulatory Visit: Payer: BC Managed Care – PPO | Admitting: Professional

## 2020-07-11 ENCOUNTER — Ambulatory Visit (INDEPENDENT_AMBULATORY_CARE_PROVIDER_SITE_OTHER): Payer: BC Managed Care – PPO | Admitting: Professional

## 2020-07-11 DIAGNOSIS — F331 Major depressive disorder, recurrent, moderate: Secondary | ICD-10-CM

## 2020-07-11 DIAGNOSIS — F411 Generalized anxiety disorder: Secondary | ICD-10-CM

## 2020-07-23 ENCOUNTER — Ambulatory Visit (INDEPENDENT_AMBULATORY_CARE_PROVIDER_SITE_OTHER): Payer: BC Managed Care – PPO | Admitting: Professional

## 2020-07-23 DIAGNOSIS — F411 Generalized anxiety disorder: Secondary | ICD-10-CM | POA: Diagnosis not present

## 2020-07-23 DIAGNOSIS — F331 Major depressive disorder, recurrent, moderate: Secondary | ICD-10-CM

## 2020-08-12 ENCOUNTER — Ambulatory Visit (INDEPENDENT_AMBULATORY_CARE_PROVIDER_SITE_OTHER): Payer: BC Managed Care – PPO | Admitting: Professional

## 2020-08-12 DIAGNOSIS — F411 Generalized anxiety disorder: Secondary | ICD-10-CM | POA: Diagnosis not present

## 2020-08-12 DIAGNOSIS — F331 Major depressive disorder, recurrent, moderate: Secondary | ICD-10-CM

## 2020-09-03 ENCOUNTER — Ambulatory Visit (INDEPENDENT_AMBULATORY_CARE_PROVIDER_SITE_OTHER): Payer: BC Managed Care – PPO | Admitting: Professional

## 2020-09-03 DIAGNOSIS — F411 Generalized anxiety disorder: Secondary | ICD-10-CM | POA: Diagnosis not present

## 2020-09-03 DIAGNOSIS — F331 Major depressive disorder, recurrent, moderate: Secondary | ICD-10-CM | POA: Diagnosis not present

## 2020-09-26 ENCOUNTER — Ambulatory Visit (INDEPENDENT_AMBULATORY_CARE_PROVIDER_SITE_OTHER): Payer: BC Managed Care – PPO | Admitting: Professional

## 2020-09-26 DIAGNOSIS — F331 Major depressive disorder, recurrent, moderate: Secondary | ICD-10-CM | POA: Diagnosis not present

## 2020-09-26 DIAGNOSIS — F411 Generalized anxiety disorder: Secondary | ICD-10-CM

## 2020-10-24 ENCOUNTER — Ambulatory Visit (INDEPENDENT_AMBULATORY_CARE_PROVIDER_SITE_OTHER): Payer: BC Managed Care – PPO | Admitting: Professional

## 2020-10-24 DIAGNOSIS — F331 Major depressive disorder, recurrent, moderate: Secondary | ICD-10-CM | POA: Diagnosis not present

## 2020-10-24 DIAGNOSIS — F411 Generalized anxiety disorder: Secondary | ICD-10-CM

## 2020-11-07 ENCOUNTER — Ambulatory Visit: Payer: BC Managed Care – PPO | Admitting: Professional

## 2020-11-14 ENCOUNTER — Ambulatory Visit: Payer: BC Managed Care – PPO | Admitting: Professional

## 2020-12-05 ENCOUNTER — Ambulatory Visit (INDEPENDENT_AMBULATORY_CARE_PROVIDER_SITE_OTHER): Payer: BC Managed Care – PPO | Admitting: Professional

## 2020-12-05 DIAGNOSIS — F411 Generalized anxiety disorder: Secondary | ICD-10-CM | POA: Diagnosis not present

## 2020-12-05 DIAGNOSIS — F331 Major depressive disorder, recurrent, moderate: Secondary | ICD-10-CM

## 2020-12-18 ENCOUNTER — Ambulatory Visit (INDEPENDENT_AMBULATORY_CARE_PROVIDER_SITE_OTHER): Payer: BC Managed Care – PPO | Admitting: Professional

## 2020-12-18 DIAGNOSIS — F331 Major depressive disorder, recurrent, moderate: Secondary | ICD-10-CM | POA: Diagnosis not present

## 2020-12-18 DIAGNOSIS — F411 Generalized anxiety disorder: Secondary | ICD-10-CM

## 2020-12-19 ENCOUNTER — Ambulatory Visit: Payer: BC Managed Care – PPO | Admitting: Professional

## 2021-01-02 ENCOUNTER — Ambulatory Visit (INDEPENDENT_AMBULATORY_CARE_PROVIDER_SITE_OTHER): Payer: BC Managed Care – PPO | Admitting: Professional

## 2021-01-02 ENCOUNTER — Encounter: Payer: Self-pay | Admitting: Professional

## 2021-01-02 DIAGNOSIS — F411 Generalized anxiety disorder: Secondary | ICD-10-CM | POA: Diagnosis not present

## 2021-01-02 DIAGNOSIS — F331 Major depressive disorder, recurrent, moderate: Secondary | ICD-10-CM | POA: Diagnosis not present

## 2021-01-02 NOTE — Progress Notes (Signed)
[facility] Counselor/Therapist Progress Note  Patient ID: Maureen [name], MRN: [id number],    Date: 01/02/2021  Time Spent: 43 minutes 10-1041 am  Treatment Type: Individual Therapy  Reported Symptoms: sad, grieving, rejected, angry  Mental Status Exam: Appearance:  Neat     Behavior: Appropriate and Sharing  Motor: Normal  Speech/Language:  Clear and Coherent and Normal Rate  Affect: Full Range  Mood: sad  Thought process: goal directed  Thought content:   WNL  Sensory/Perceptual disturbances:   WNL  Orientation: oriented to person, place, time/date, and situation  Attention: Good  Concentration: Good  Memory: WNL  Fund of knowledge:  Good  Insight:   Good  Judgment:  Good  Impulse Control: Good   Risk Assessment: Danger to Self:  No Self-injurious Behavior: No Danger to Others: No Duty to Warn:no Physical Aggression / Violence:No  Access to Firearms a concern: No  Gang Involvement:No   Subjective: This session was held via video teletherapy due to the coronavirus risk at this time. The patient consented to video teletherapy and was located at her home during this session. She is aware it is the responsibility of the patient to secure confidentiality on her end of the session. The provider was in a private home office for the duration of this session.   The patient arrived on time for her webex appointment appearing nicely groomed and easily engaged.  Issues addressed: 1-[place]  -her favorite uncle, her mother's brother, passed away one week after her mother   -she got to spend time with him when she was there to deal with her mother's home -enjoyed the time with her daughter's   -she regrets not having better prepared them for the purpose of the visit   -her daughter's wanted her to go into the city     -the pt stood her ground and did not let them go -her mom's house was awful   -she had been sleeping on the sofa and the pt realized it was the same  sofa they had when her grandmother was alive   -she had hoarded things so badly that you could not get into rooms   -nothing could be donated because of the mice poop and infestation   -she consulted prior to getting to [place] and he agreed to have the house cleaned   -she took a photo album, her uncle gave her the important papers, many personal things (jewelry, pictures)   -she went to [place] to the hospital to get her mother's things     -they could not find her mother's things     -they (security) found her things eventually      -she was able to call people to let them know    -she, her daughters and best friend went to the house and looked around for what they might want   -the home was not dirty, her kitchen and bathroom was clean but it was just piles having everywhere   -pt broke down and started crying because she could not believe this was how she was living     -she didn't get the care she needed and was not living the way she needed   -pt admits that her mother lived life the way she wanted -she reports her mother, despite living on a small social security, always paid her life insurance 2-brother -drinking excessively -initially told her to handle everything -he began calling funeral home, insurance company -her children were placed as  beneficiaries with her brother and not the patient   -he called their children and told them they need to complete forms -pt feels hurt and rejected by her mother even in her death   -pt feels let down by her mother again   -pt reports to not even be acknowledged by her mother in her death makes her hurt   -pt reports she is thankful that God brought so many great women in her life     Her mother in [place] June, her father's 2nd wife, was there when she was born     -she talked to her father's third wife Mimi last night who told her she has many more blessings coming her way -pt is coping by using prayer -she talked to him last evening and he said he was  trying to take the load off     -pt reports he has caused so much confusion     -pt reports she knows it is because he wants the money 3-professional -returned to work this week and was able to concentrate  Problems Addressed  Grief / Loss Unresolved, Anxiety, Depression, Vocational Stress, Vocational Stress, Anxiety, Grief / Loss Unresolved, Financial Stress  Goals 1. Develop an awareness of how the avoidance of grieving has affected life and begin the healing process. 2. Enhance ability to effectively cope with the full variety of life's worries and anxieties. Objective Complete a Cost Benefit Analysis of maintaining the anxiety. Target Date: 2021-02-16 Frequency: Biweekly  Progress: 0 Modality: individual  Related Interventions Ask the client to evaluate the costs and benefits of worries (e.g., complete the Cost Benefit Analysis exercise in Ten Days to Self-Esteem! by Lawerance Bach) in which he/she lists the advantages and disadvantages of the negative thought, fear, or anxiety; process the completed assignment. Objective Identify the major life conflicts from the past and present that form the basis for present anxiety. Target Date: 2021-02-16 Frequency: Biweekly  Progress: 0 Modality: individual  Related Interventions Assist the client in becoming aware of key unresolved life conflicts and in starting to work toward their resolution. Reinforce the client's insights into the role of his/her past emotional pain and present anxiety. Ask the client to develop and process a list of key past and present life conflicts that continue to cause worry. Objective Describe situations, thoughts, feelings, and actions associated with anxieties and worries, their impact on functioning, and attempts to resolve them. Target Date: 2021-02-16 Frequency: Biweekly  Progress: 0 Modality: individual  Related Interventions Ask the client to describe his/her past experiences of anxiety and their impact on  functioning; assess the focus, excessiveness, and uncontrollability of the worry and the type, frequency, intensity, and duration of his/her anxiety symptoms (consider using a structured interview such as The Anxiety Disorders Interview Schedule-Adult Version). Focus on developing a level of trust with the client; provide support and empathy to encourage the client to feel safe in expressing his/her GAD symptoms. 3. Identify and increase intrapersonal, interpersonal, and physical resources to foster positive coping strategies. Objective Increase social contacts and communicate needs within existing interpersonal relationships. Target Date: 2021-02-16 Frequency: Biweekly  Progress: 80 Modality: individual  Objective Identify and replace cognitive self-talk that supports depression. Target Date: 2021-02-16 Frequency: Biweekly  Progress: 60 Modality: individual  Related Interventions Encourage the client to discuss cognitive distortions, including automatic thoughts (e.g., negative view of self, future, experience) and negative schemas (e.g., core beliefs about self and others based on earlier childhood experiences); assess frequency of negative self-statements associated with  depression. Objective Increase the frequency of engaging in pleasant activities. Target Date: 2021-02-16 Frequency: Biweekly  Progress: 50 Modality: individual  Related Interventions Periodically assess and monitor the client for suicide potential; intervene as necessary. Monitor and encourage the client to attend to personal grooming and healthy sleeping and eating patterns; reinforce improvement. Objective Implement behavioral interventions to overcome depression. Target Date: 2021-02-16 Frequency: Biweekly  Progress: 80 Modality: individual  Related Interventions Assist the client to consider upcoming stressors; brainstorm with her 10 ways to mitigate depression in the future. Assign the client to participate as fully  as possible in a healthy exercise regimen. Encourage the client to continue participation in positive social support systems. Objective Implement assertiveness to communicate needs and desires to others. Target Date: 2021-02-16 Frequency: Biweekly  Progress: 70 Modality: individual  Objective Implement conflict resolution skills to alleviate interpersonal sources of depressed mood. Target Date: 2021-02-16 Frequency: Biweekly  Progress: 50 Modality: individual  Objective Articulate self-care methods to prevent and/or manage depression in the future; create a list of 10 coping methods for future use. Target Date: 2021-02-16 Frequency: Biweekly  Progress: 60 Modality: individual  Related Interventions Help the client resolve depression related to interpersonal problems through the use of reassurance and support, clarification of cognitive and affective triggers that ignite conflicts, and active problem-solving (or assign "Applying Problem-Solving to Interpersonal Conflict" in the Adult Psychotherapy Homework Planner, 2nd ed. by Stephannie Li). Teach the client conflict resolution skills (e.g., empathy, active listening, "I messages," respectful communication, assertiveness without aggression, compromise) to help alleviate depression; use modeling, role-playing, and behavior rehearsal to work through several current conflicts. Assign the client to write letters to individuals who may be contributing to her distress and depression, asking that she outline what she wants in the letters; process the letters, generating solutions within an interpersonal context. Explore the role of interpersonal conflict in maintaining the client's depression; clarify the sources and nature of these conflicts. 4. Improve satisfaction and comfort surrounding coworker relationships. 5. Increase sense of confidence and competence in dealing with work responsibilities. Objective Develop and verbalize a plan for constructive  action to reduce vocational stress. Target Date: 2021-02-16 Frequency: Biweekly  Progress: 0 Modality: individual  Related Interventions Assist the client in developing a plan to react positively to his/her vocational situation (or assign "My Vocational Action Plan" in the Adult Psychotherapy Homework Planner by [facility]); process the proactive plan and assist in its implementation. Objective Replace projection of responsibility for the conflict with acceptance of responsibility for own role in conflict. Target Date: 2021-02-16 Frequency: Biweekly  Progress: 0 Modality: individual  Related Interventions Reinforce the client's acceptance of responsibility for personal feelings and behavior as they contribute to the conflict in the work setting. Confront the client's projection of responsibility for his/her behavior and feelings onto others; emphasize his/her need to examine his/her own role in the conflict. Objective Review family-of-origin history to determine roots for interpersonal conflict. Target Date: 2021-02-16 Frequency: Biweekly  Progress: 0 Modality: individual  Related Interventions Probe the client's family-of-origin history for causes of current interpersonal conflict patterns that are being reenacted in the work setting. Objective Identify patterns of similar conflict with people outside the work environment. Target Date: 2021-02-16 Frequency: Biweekly  Progress: 0 Modality: individual  Related Interventions Explore the client's patterns of interpersonal conflict that occur beyond the work setting but are repeated in the work setting. 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 Target Date: 2021-02-16 Frequency: Biweekly  Progress: 0 Modality: individual  Related  Interventions Help the client identify his/her own role in the conflict, attempting to represent the other party's point of view. Clarify the nature of the client's conflicts in the  work setting. Objective Learn and implement problem-solving skills. Target Date: 2021-02-16 Frequency: Biweekly  Progress: 0 Modality: individual  Related Interventions Conduct Problem-Solving Therapy (see Problem-Solving Therapy by Domenick Bookbinder and Rob Hickman) using techniques such as psychoeducation, modeling, and role-playing to teach the client problem-solving skills (i.e., defining a problem specifically, generating possible solutions, evaluating the pros and cons of each solution, selecting and implementing a plan of action, evaluating the efficacy of the plan, accepting or revising the plan); role-play application of the problem-solving skill to a real life issue (or assign "Applying Problem-Solving to Interpersonal Conflict" in the Adult Psychotherapy Homework Planner by Stephannie Li). 6. Resolve the core conflict that is the source of anxiety. 7. Resolve the loss, reengaging in old relationships and initiating new contacts with others. Objective Express thoughts and feelings about the deceased that went unexpressed while the deceased was alive. Target Date: 2021-02-16 Frequency: Biweekly  Progress: 0 Modality: individual  Related Interventions Assign the client to visit the grave of the lost loved one to "talk to" the deceased and express his/her feelings. Ask the client to write a letter to the lost person describing his/her fond memories and/or painful and regretful memories, and how he/she currently feels life (or assign "Dear : A Letter to a Lost Loved One" in the Adult Psychotherapy Homework Planner by [facility]); process the letter in session. Conduct an empty-chair exercise with the client where he/she focuses on expressing to the lost loved one imagined in the chair what he/she never said while that loved one was alive. Objective Identify how avoiding dealing with loss has negatively impacted life. Target Date: 2021-02-16 Frequency: Biweekly  Progress: 0 Modality: individual  Related  Interventions Ask the client to list ways that avoidance of grieving has negatively impacted his/her life.  Interventions: Solution-Oriented/Positive Psychology, Grief Therapy, Insight-Oriented, and Family Systems  Diagnosis:Major depressive disorder, recurrent episode, moderate (HCC)  Generalized anxiety disorder  Plan:  -meet again on Saturday, January 23, 2021 at 10 am  Teofilo Pod, [facility]

## 2021-01-23 ENCOUNTER — Ambulatory Visit (INDEPENDENT_AMBULATORY_CARE_PROVIDER_SITE_OTHER): Payer: BC Managed Care – PPO | Admitting: Professional

## 2021-01-23 ENCOUNTER — Encounter: Payer: Self-pay | Admitting: Professional

## 2021-01-23 DIAGNOSIS — F411 Generalized anxiety disorder: Secondary | ICD-10-CM | POA: Insufficient documentation

## 2021-01-23 DIAGNOSIS — F331 Major depressive disorder, recurrent, moderate: Secondary | ICD-10-CM | POA: Diagnosis not present

## 2021-01-23 NOTE — Progress Notes (Addendum)
[facility] Counselor/Therapist Progress Note  Patient ID: KOLBY MYUNG, MRN: [id number],    Date: 01/23/2021  Time Spent: 40 minutes 10-1038 am  Treatment Type: Individual Therapy  Reported Symptoms: pleasant, tired, overwhelmed, confused by appointment time (thought it was 9)  Mental Status Exam: Appearance:  Well Groomed     Behavior: Appropriate and Sharing  Motor: Normal  Speech/Language:  Clear and Coherent and Normal Rate  Affect: Full Range  Mood: normal  Thought process: goal directed  Thought content:   WNL  Sensory/Perceptual disturbances:   WNL  Orientation: oriented to person, place, time/date, and situation  Attention: Good  Concentration: Good  Memory: WNL  Fund of knowledge:  Good  Insight:   Good  Judgment:  Good  Impulse Control: Good   Risk Assessment: Danger to Self:  No Self-injurious Behavior: No Danger to Others: No Duty to Warn:no Physical Aggression / Violence:No  Access to Firearms a concern: No  Gang Involvement:No   Subjective: This session was held via video teletherapy due to the coronavirus risk at this time. The patient consented to video teletherapy and was located at her home during this session. She is aware it is the responsibility of the patient to secure confidentiality on her end of the session. The provider was in a private home office for the duration of this session.   The patient arrived on time for her webex appointment appearing nicely groomed and easily engaged  Issues addressed: 1- mood -tired and overwhelmed 2-mom -has not gotten things out of her car that she brought back from her mother's home -she has spent her whole life that she fits in -God revealed to her that she is his daughter and was only on loan to her parents   -brings great comfort -her [place] church is "really family to me" -patient recognizes that her parents were to guide her and they did not   -she was not responsible for raising  herself -pt reports she works on trusting and faith of her Kenna Gilbert Father -patient admits to the need to forgive her mom -feels like mom used her living with her dad in [place] as an excuse   -her mother believed she did not have access     -her father provided full access   -her mom would try and buy her love     -as a child she recalls bags and bags of gifts       -did not hear from her the rest of the year     -her grandmother carried her mother's "load" 3-children -son has accepted a one season contract to play for a basketball team in [place]   -patient will be traveling there in April with Brayton Caves to see him -her children made the decision to gift their mother with some of their inheritance   -her mom excluded her from the will   -her children have decided to give her $58K   -patient very proud of their decision and it was unexpected -she will be assisted her two youngest (daughters) with how to manage their inheritance   -she is getting them connected with a financial planner at the credit union -she was criticized by her children's father for allowing her daughters freedoms that he would not permit   -patient chose not to react   -patient discussed how similar he actually is to her mother and she did not realize 4-spoke with brother for the new year -patient plans to re-establish relationship with  brother in a different way -the way he has approached her is not how she will permit 5-future -patient wants to keep things "simple" this year -patient plans to debride the house of the kids "stuff"  Problems Addressed  Anxiety, Depression, Grief / Loss Unresolved, Vocational Stress  Goals 1. Develop an awareness of how the avoidance of grieving has affected life and begin the healing process. 2. Enhance ability to effectively cope with the full variety of life's worries and anxieties. Objective Complete a Cost Benefit Analysis of maintaining the anxiety. Target Date: 2021-02-16  Frequency: Biweekly  Progress: 0 Modality: individual  Related Interventions Ask the client to evaluate the costs and benefits of worries (e.g., complete the Cost Benefit Analysis exercise in Ten Days to Self-Esteem! by Lawerance BachBurns) in which he/she lists the advantages and disadvantages of the negative thought, fear, or anxiety; process the completed assignment. Objective Identify the major life conflicts from the past and present that form the basis for present anxiety. Target Date: 2021-02-16 Frequency: Biweekly  Progress: 0 Modality: individual  Related Interventions Assist the client in becoming aware of key unresolved life conflicts and in starting to work toward their resolution. Reinforce the client's insights into the role of his/her past emotional pain and present anxiety. Ask the client to develop and process a list of key past and present life conflicts that continue to cause worry. Objective Describe situations, thoughts, feelings, and actions associated with anxieties and worries, their impact on functioning, and attempts to resolve them. Target Date: 2021-02-16 Frequency: Biweekly  Progress: 0 Modality: individual  Related Interventions Ask the client to describe his/her past experiences of anxiety and their impact on functioning; assess the focus, excessiveness, and uncontrollability of the worry and the type, frequency, intensity, and duration of his/her anxiety symptoms (consider using a structured interview such as The Anxiety Disorders Interview Schedule-Adult Version). Focus on developing a level of trust with the client; provide support and empathy to encourage the client to feel safe in expressing his/her GAD symptoms. 3. Identify and increase intrapersonal, interpersonal, and physical resources to foster positive coping strategies. Objective Increase social contacts and communicate needs within existing interpersonal relationships. Target Date: 2021-02-16 Frequency: Biweekly   Progress: 80 Modality: individual  Objective Identify and replace cognitive self-talk that supports depression. Target Date: 2021-02-16 Frequency: Biweekly  Progress: 60 Modality: individual  Related Interventions Encourage the client to discuss cognitive distortions, including automatic thoughts (e.g., negative view of self, future, experience) and negative schemas (e.g., core beliefs about self and others based on earlier childhood experiences); assess frequency of negative self-statements associated with depression. Objective Increase the frequency of engaging in pleasant activities. Target Date: 2021-02-16 Frequency: Biweekly  Progress: 50 Modality: individual  Related Interventions Periodically assess and monitor the client for suicide potential; intervene as necessary. Monitor and encourage the client to attend to personal grooming and healthy sleeping and eating patterns; reinforce improvement. Objective Implement behavioral interventions to overcome depression. Target Date: 2021-02-16 Frequency: Biweekly  Progress: 80 Modality: individual  Related Interventions Assist the client to consider upcoming stressors; brainstorm with her 10 ways to mitigate depression in the future. Assign the client to participate as fully as possible in a healthy exercise regimen. Encourage the client to continue participation in positive social support systems. Objective Implement assertiveness to communicate needs and desires to others. Target Date: 2021-02-16 Frequency: Biweekly  Progress: 70 Modality: individual  Objective Implement conflict resolution skills to alleviate interpersonal sources of depressed mood. Target Date: 2021-02-16 Frequency: Biweekly  Progress: 50  Modality: individual  Objective Articulate self-care methods to prevent and/or manage depression in the future; create a list of 10 coping methods for future use. Target Date: 2021-02-16 Frequency: Biweekly  Progress: 60  Modality: individual  Related Interventions Help the client resolve depression related to interpersonal problems through the use of reassurance and support, clarification of cognitive and affective triggers that ignite conflicts, and active problem-solving (or assign "Applying Problem-Solving to Interpersonal Conflict" in the Adult Psychotherapy Homework Planner, 2nd ed. by Stephannie Li). Teach the client conflict resolution skills (e.g., empathy, active listening, "I messages," respectful communication, assertiveness without aggression, compromise) to help alleviate depression; use modeling, role-playing, and behavior rehearsal to work through several current conflicts. Assign the client to write letters to individuals who may be contributing to her distress and depression, asking that she outline what she wants in the letters; process the letters, generating solutions within an interpersonal context. Explore the role of interpersonal conflict in maintaining the client's depression; clarify the sources and nature of these conflicts. 4. Improve satisfaction and comfort surrounding coworker relationships. 5. Increase sense of confidence and competence in dealing with work responsibilities. Objective Develop and verbalize a plan for constructive action to reduce vocational stress. Target Date: 2021-02-16 Frequency: Biweekly  Progress: 0 Modality: individual  Related Interventions Assist the client in developing a plan to react positively to his/her vocational situation (or assign "My Vocational Action Plan" in the Adult Psychotherapy Homework Planner by [facility]); process the proactive plan and assist in its implementation. Objective Replace projection of responsibility for the conflict with acceptance of responsibility for own role in conflict. Target Date: 2021-02-16 Frequency: Biweekly  Progress: 0 Modality: individual  Related Interventions Reinforce the client's acceptance of responsibility for  personal feelings and behavior as they contribute to the conflict in the work setting. Confront the client's projection of responsibility for his/her behavior and feelings onto others; emphasize his/her need to examine his/her own role in the conflict. Objective Review family-of-origin history to determine roots for interpersonal conflict. Target Date: 2021-02-16 Frequency: Biweekly  Progress: 0 Modality: individual  Related Interventions Probe the client's family-of-origin history for causes of current interpersonal conflict patterns that are being reenacted in the work setting. Objective Identify patterns of similar conflict with people outside the work environment. Target Date: 2021-02-16 Frequency: Biweekly  Progress: 0 Modality: individual  Related Interventions Explore the client's patterns of interpersonal conflict that occur beyond the work setting but are repeated in the work setting. 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 Target Date: 2021-02-16 Frequency: Biweekly  Progress: 0 Modality: individual  Related Interventions Help the client identify his/her own role in the conflict, attempting to represent the other party's point of view. Clarify the nature of the client's conflicts in the work setting. Objective Learn and implement problem-solving skills. Target Date: 2021-02-16 Frequency: Biweekly  Progress: 0 Modality: individual  Related Interventions Conduct Problem-Solving Therapy (see Problem-Solving Therapy by Domenick Bookbinder and Rob Hickman) using techniques such as psychoeducation, modeling, and role-playing to teach the client problem-solving skills (i.e., defining a problem specifically, generating possible solutions, evaluating the pros and cons of each solution, selecting and implementing a plan of action, evaluating the efficacy of the plan, accepting or revising the plan); role-play application of the problem-solving skill to a real life issue (or  assign "Applying Problem-Solving to Interpersonal Conflict" in the Adult Psychotherapy Homework Planner by Stephannie Li). 6. Resolve the core conflict that is the source of anxiety. 7. Resolve the loss, reengaging in old relationships and initiating new  contacts with others. Objective Express thoughts and feelings about the deceased that went unexpressed while the deceased was alive. Target Date: 2021-02-16 Frequency: Biweekly  Progress: 0 Modality: individual  Related Interventions Assign the client to visit the grave of the lost loved one to "talk to" the deceased and express his/her feelings. Ask the client to write a letter to the lost person describing his/her fond memories and/or painful and regretful memories, and how he/she currently feels life (or assign "Dear : A Letter to a Lost Loved One" in the Adult Psychotherapy Homework Planner by [facility]); process the letter in session. Conduct an empty-chair exercise with the client where he/she focuses on expressing to the lost loved one imagined in the chair what he/she never said while that loved one was alive. Objective Identify how avoiding dealing with loss has negatively impacted life. Target Date: 2021-02-16 Frequency: Biweekly  Progress: 0 Modality: individual  Related Interventions Ask the client to list ways that avoidance of grieving has negatively impacted his/her life. 8. Understand personal desires, insecurities, and anxieties that make overspending possible.  Diagnosis:Major depressive disorder, recurrent episode, moderate (HCC)  Generalized anxiety disorder  Plan:  -meet again on Saturday, February 13, 2021 at 10am  [place] HartKathy Arleny Kruger, [facility]

## 2021-02-10 ENCOUNTER — Ambulatory Visit
Admission: RE | Admit: 2021-02-10 | Discharge: 2021-02-10 | Disposition: A | Discharge status: Home / Self Care | Payer: BC Managed Care – PPO | Source: Ambulatory Visit | Attending: Internal Medicine | Admitting: Internal Medicine

## 2021-02-10 ENCOUNTER — Other Ambulatory Visit: Payer: Self-pay | Admitting: Internal Medicine

## 2021-02-10 DIAGNOSIS — R059 Cough, unspecified: Secondary | ICD-10-CM

## 2021-02-13 ENCOUNTER — Ambulatory Visit (INDEPENDENT_AMBULATORY_CARE_PROVIDER_SITE_OTHER): Payer: BC Managed Care – PPO | Admitting: Professional

## 2021-02-13 ENCOUNTER — Encounter: Payer: Self-pay | Admitting: Professional

## 2021-02-13 DIAGNOSIS — F411 Generalized anxiety disorder: Secondary | ICD-10-CM

## 2021-02-13 DIAGNOSIS — F331 Major depressive disorder, recurrent, moderate: Secondary | ICD-10-CM | POA: Diagnosis not present

## 2021-02-13 NOTE — Progress Notes (Signed)
[facility] Counselor/Therapist Progress Note  Patient ID: Maureen [name], MRN: [id number],    Date: 02/13/2021  Time Spent: 40 minutes 10-1038 am  Treatment Type: Individual Therapy  Reported Symptoms: tired, ongoing body pain and stiffness, sullen  Mental Status Exam: Appearance:  Well Groomed     Behavior: Appropriate and Sharing  Motor: Normal  Speech/Language:  Clear and Coherent and Normal Rate  Affect: Full Range  Mood: normal  Thought process: goal directed  Thought content:   WNL  Sensory/Perceptual disturbances:   WNL  Orientation: oriented to person, place, time/date, and situation  Attention: Good  Concentration: Good  Memory: WNL  Fund of knowledge:  Good  Insight:   Good  Judgment:  Good  Impulse Control: Good   Risk Assessment: Danger to Self:  No Self-injurious Behavior: No Danger to Others: No Duty to Warn:no Physical Aggression / Violence:No  Access to Firearms a concern: No  Gang Involvement:No   Subjective: This session was held via video teletherapy due to the coronavirus risk at this time. The patient consented to video teletherapy and was located at her home during this session. She is aware it is the responsibility of the patient to secure confidentiality on her end of the session. The provider was in a private home office for the duration of this session.   The patient arrived on time for her webex appointment appearing nicely groomed and easily engaged  Issues addressed: 1- medical -pt having orthopedic issues -body difficult to move -trying to work with MD to determine issues   -did blood work   -more sedentary on weekends to recover     -walking at least 10k per day at work 2-children -going wedding dress shopping -son now in [place] for two weeks 3-grief -avoiding talking about -loss of Elsie LincolnUncle John one week after her mother   -when she saw him in [place] she held him and could not let go   -she watched him walk into her  mother's home until he closed the door   -when she got back in [place] her brother called and told her he had died of heart attack -pt stuffing feelings   -admits to anger   -doesn't know what to do with   -educated and provided examples -pt feeling disconnected from maternal side of family   -tearful as she shared significance of loss of Elsie LincolnUncle John   -how to forge relationship with her Chipper Omanunt Evelyn, Kateri McUncle John's wife     -she may possibly be able to answer questions -discussed importance of grieving and how she might do so -suggested talking with bff could be helpful since she trusts her -pt did not consider how her mental health may be impacting physical health  Problems Addressed  Anxiety, Depression, Grief / Loss Unresolved, Vocational Stress  Goals 1. Develop an awareness of how the avoidance of grieving has affected life and begin the healing process. 2. Enhance ability to effectively cope with the full variety of life's worries and anxieties. Objective Complete a Cost Benefit Analysis of maintaining the anxiety. Target Date: 2021-02-16 Frequency: Biweekly  Progress: 0 Modality: individual  Related Interventions Ask the client to evaluate the costs and benefits of worries (e.g., complete the Cost Benefit Analysis exercise in Ten Days to Self-Esteem! by Lawerance BachBurns) in which he/she lists the advantages and disadvantages of the negative thought, fear, or anxiety; process the completed assignment. Objective Identify the major life conflicts from the past and present that form the basis for  present anxiety. Target Date: 2021-02-16 Frequency: Biweekly  Progress: 0 Modality: individual  Related Interventions Assist the client in becoming aware of key unresolved life conflicts and in starting to work toward their resolution. Reinforce the client's insights into the role of his/her past emotional pain and present anxiety. Ask the client to develop and process a list of key past and present life  conflicts that continue to cause worry. Objective Describe situations, thoughts, feelings, and actions associated with anxieties and worries, their impact on functioning, and attempts to resolve them. Target Date: 2021-02-16 Frequency: Biweekly  Progress: 0 Modality: individual  Related Interventions Ask the client to describe his/her past experiences of anxiety and their impact on functioning; assess the focus, excessiveness, and uncontrollability of the worry and the type, frequency, intensity, and duration of his/her anxiety symptoms (consider using a structured interview such as The Anxiety Disorders Interview Schedule-Adult Version). Focus on developing a level of trust with the client; provide support and empathy to encourage the client to feel safe in expressing his/her GAD symptoms. 3. Identify and increase intrapersonal, interpersonal, and physical resources to foster positive coping strategies. Objective Increase social contacts and communicate needs within existing interpersonal relationships. Target Date: 2021-02-16 Frequency: Biweekly  Progress: 80 Modality: individual  Objective Identify and replace cognitive self-talk that supports depression. Target Date: 2021-02-16 Frequency: Biweekly  Progress: 60 Modality: individual  Related Interventions Encourage the client to discuss cognitive distortions, including automatic thoughts (e.g., negative view of self, future, experience) and negative schemas (e.g., core beliefs about self and others based on earlier childhood experiences); assess frequency of negative self-statements associated with depression. Objective Increase the frequency of engaging in pleasant activities. Target Date: 2021-02-16 Frequency: Biweekly  Progress: 50 Modality: individual  Related Interventions Periodically assess and monitor the client for suicide potential; intervene as necessary. Monitor and encourage the client to attend to personal grooming and  healthy sleeping and eating patterns; reinforce improvement. Objective Implement behavioral interventions to overcome depression. Target Date: 2021-02-16 Frequency: Biweekly  Progress: 80 Modality: individual  Related Interventions Assist the client to consider upcoming stressors; brainstorm with her 10 ways to mitigate depression in the future. Assign the client to participate as fully as possible in a healthy exercise regimen. Encourage the client to continue participation in positive social support systems. Objective Implement assertiveness to communicate needs and desires to others. Target Date: 2021-02-16 Frequency: Biweekly  Progress: 70 Modality: individual  Objective Implement conflict resolution skills to alleviate interpersonal sources of depressed mood. Target Date: 2021-02-16 Frequency: Biweekly  Progress: 50 Modality: individual  Objective Articulate self-care methods to prevent and/or manage depression in the future; create a list of 10 coping methods for future use. Target Date: 2021-02-16 Frequency: Biweekly  Progress: 60 Modality: individual  Related Interventions Help the client resolve depression related to interpersonal problems through the use of reassurance and support, clarification of cognitive and affective triggers that ignite conflicts, and active problem-solving (or assign "Applying Problem-Solving to Interpersonal Conflict" in the Adult Psychotherapy Homework Planner, 2nd ed. by Stephannie Li). Teach the client conflict resolution skills (e.g., empathy, active listening, "I messages," respectful communication, assertiveness without aggression, compromise) to help alleviate depression; use modeling, role-playing, and behavior rehearsal to work through several current conflicts. Assign the client to write letters to individuals who may be contributing to her distress and depression, asking that she outline what she wants in the letters; process the letters, generating  solutions within an interpersonal context. Explore the role of interpersonal conflict in maintaining the client's depression; clarify  the sources and nature of these conflicts. 4. Improve satisfaction and comfort surrounding coworker relationships. 5. Increase sense of confidence and competence in dealing with work responsibilities. Objective Develop and verbalize a plan for constructive action to reduce vocational stress. Target Date: 2021-02-16 Frequency: Biweekly  Progress: 0 Modality: individual  Related Interventions Assist the client in developing a plan to react positively to his/her vocational situation (or assign "My Vocational Action Plan" in the Adult Psychotherapy Homework Planner by [facility]); process the proactive plan and assist in its implementation. Objective Replace projection of responsibility for the conflict with acceptance of responsibility for own role in conflict. Target Date: 2021-02-16 Frequency: Biweekly  Progress: 0 Modality: individual  Related Interventions Reinforce the client's acceptance of responsibility for personal feelings and behavior as they contribute to the conflict in the work setting. Confront the client's projection of responsibility for his/her behavior and feelings onto others; emphasize his/her need to examine his/her own role in the conflict. Objective Review family-of-origin history to determine roots for interpersonal conflict. Target Date: 2021-02-16 Frequency: Biweekly  Progress: 0 Modality: individual  Related Interventions Probe the client's family-of-origin history for causes of current interpersonal conflict patterns that are being reenacted in the work setting. Objective Identify patterns of similar conflict with people outside the work environment. Target Date: 2021-02-16 Frequency: Biweekly  Progress: 0 Modality: individual  Related Interventions Explore the client's patterns of interpersonal conflict that occur beyond the work  setting but are repeated in the work setting. 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 Target Date: 2021-02-16 Frequency: Biweekly  Progress: 0 Modality: individual  Related Interventions Help the client identify his/her own role in the conflict, attempting to represent the other party's point of view. Clarify the nature of the client's conflicts in the work setting. Objective Learn and implement problem-solving skills. Target Date: 2021-02-16 Frequency: Biweekly  Progress: 0 Modality: individual  Related Interventions Conduct Problem-Solving Therapy (see Problem-Solving Therapy by Domenick Bookbinder and Rob Hickman) using techniques such as psychoeducation, modeling, and role-playing to teach the client problem-solving skills (i.e., defining a problem specifically, generating possible solutions, evaluating the pros and cons of each solution, selecting and implementing a plan of action, evaluating the efficacy of the plan, accepting or revising the plan); role-play application of the problem-solving skill to a real life issue (or assign "Applying Problem-Solving to Interpersonal Conflict" in the Adult Psychotherapy Homework Planner by Stephannie Li). 6. Resolve the core conflict that is the source of anxiety. 7. Resolve the loss, reengaging in old relationships and initiating new contacts with others. Objective Express thoughts and feelings about the deceased that went unexpressed while the deceased was alive. Target Date: 2021-02-16 Frequency: Biweekly  Progress: 0 Modality: individual  Related Interventions Assign the client to visit the grave of the lost loved one to "talk to" the deceased and express his/her feelings. Ask the client to write a letter to the lost person describing his/her fond memories and/or painful and regretful memories, and how he/she currently feels life (or assign "Dear : A Letter to a Lost Loved One" in the Adult Psychotherapy Homework Planner by [facility]);  process the letter in session. Conduct an empty-chair exercise with the client where he/she focuses on expressing to the lost loved one imagined in the chair what he/she never said while that loved one was alive. Objective Identify how avoiding dealing with loss has negatively impacted life. Target Date: 2021-02-16 Frequency: Biweekly  Progress: 0 Modality: individual  Related Interventions Ask the client to list ways that avoidance  of grieving has negatively impacted his/her life. 8. Understand personal desires, insecurities, and anxieties that make overspending possible.  Diagnosis:Major depressive disorder, recurrent episode, moderate (HCC)  Generalized anxiety disorder  Plan:  -meet again on Saturday, February 27, 2021 at 10am  [place] Valma [place], [facility]                  [place], [facility]

## 2021-02-27 ENCOUNTER — Encounter: Payer: Self-pay | Admitting: Professional

## 2021-02-27 ENCOUNTER — Ambulatory Visit (INDEPENDENT_AMBULATORY_CARE_PROVIDER_SITE_OTHER): Payer: BC Managed Care – PPO | Admitting: Professional

## 2021-02-27 DIAGNOSIS — F331 Major depressive disorder, recurrent, moderate: Secondary | ICD-10-CM | POA: Diagnosis not present

## 2021-02-27 DIAGNOSIS — F411 Generalized anxiety disorder: Secondary | ICD-10-CM

## 2021-02-27 NOTE — Progress Notes (Signed)
[facility] Counselor/Therapist Progress Note  Patient ID: Maureen [name], MRN: [id number],    Date: 02/27/2021  Time Spent: 42 minutes 1002-1044 am  Treatment Type: Individual Therapy  Reported Symptoms: tired, grieving  Mental Status Exam: Appearance:  Well Groomed     Behavior: Appropriate and Sharing  Motor: Normal  Speech/Language:  Clear and Coherent and Normal Rate  Affect: Full Range  Mood: normal  Thought process: goal directed  Thought content:   WNL  Sensory/Perceptual disturbances:   WNL  Orientation: oriented to person, place, time/date, and situation  Attention: Good  Concentration: Good  Memory: WNL  Fund of knowledge:  Good  Insight:   Good  Judgment:  Good  Impulse Control: Good   Risk Assessment: Danger to Self:  No Self-injurious Behavior: No Danger to Others: No Duty to Warn:no Physical Aggression / Violence:No  Access to Firearms a concern: No  Gang Involvement:No   Subjective: This session was held via video teletherapy due to the coronavirus risk at this time. The patient consented to video teletherapy and was located at her home during this session. She is aware it is the responsibility of the patient to secure confidentiality on her end of the session. The provider was in a private home office for the duration of this session.   The patient arrived on time for her webex appointment appearing casually groomed and easily engaged.  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 -she follow up with MD and she is getting connected with PT  -she has increased her walking more 2-children -son is back from [place]   -she had to get him out because he was not safe   -he called her crying and said he could no longer stay there 3-grief -last Friday pt started crying on he way to her parents house -she purged and when she got  home her father told her how good she looked -she took the bags from her mother's home out of the trunk and washed her clothes   -the bags had been there since November -she wore her mother's shirt to work yesterday 4-relationship with brother -she texts him all the time -she texted him yesterday with the letter from the eye bank thanking her for the donation of her mother's eyes  Treatment Plan Problems Addressed-Anxiety, Depression, Grief / Loss Unresolved, Vocational Stress  Goals 1. Develop an awareness of how the avoidance of grieving has affected life and begin the healing process. 2. Enhance ability to effectively cope with the full variety of life's worries and anxieties. Objective Complete a Cost Benefit Analysis of maintaining the anxiety. Target Date: 2022-02-15 Frequency: Biweekly  Progress: 0 Modality: individual  Related Interventions Ask the client to evaluate the costs and benefits of worries (e.g., complete the Cost Benefit Analysis exercise in Ten Days to Self-Esteem! by Lawerance Bach) in which he/she lists the advantages and disadvantages of the negative thought, fear, or anxiety; process the completed assignment. Objective Describe situations, thoughts, feelings, and actions associated with anxieties and worries, their impact on functioning, and attempts to resolve them. Target Date: 2022-02-15 Frequency: Biweekly  Progress: 0 Modality: individual  Related Interventions Focus on developing a level of trust with the client; provide support and empathy to encourage the client to feel safe in expressing his/her GAD symptoms. Ask the client to describe his/her past experiences of anxiety and their  impact on functioning; assess the focus, excessiveness, and uncontrollability of the worry and the type, frequency, intensity, and duration of his/her anxiety symptoms (consider using a structured interview such as The Anxiety Disorders Interview Schedule-Adult  Version). Objective Identify the major life conflicts from the past and present that form the basis for present anxiety. Target Date: 2022-02-15 Frequency: Biweekly  Progress: 0 Modality: individual  Related Interventions Ask the client to develop and process a list of key past and present life conflicts that continue to cause worry. Reinforce the client's insights into the role of his/her past emotional pain and present anxiety. Assist the client in becoming aware of key unresolved life conflicts and in starting to work toward their resolution. 3. Identify and increase intrapersonal, interpersonal, and physical resources to foster positive coping strategies. Objective Articulate self-care methods to prevent and/or manage depression in the future; create a list of 10 coping methods for future use. Target Date: 2022-02-15 Frequency: Biweekly  Progress: 60 Modality: individual  Related Interventions Teach the client conflict resolution skills (e.g., empathy, active listening, "I messages," respectful communication, assertiveness without aggression, compromise) to help alleviate depression; use modeling, role-playing, and behavior rehearsal to work through several current conflicts. Explore the role of interpersonal conflict in maintaining the client's depression; clarify the sources and nature of these conflicts. Assign the client to write letters to individuals who may be contributing to her distress and depression, asking that she outline what she wants in the letters; process the letters, generating solutions within an interpersonal context. Help the client resolve depression related to interpersonal problems through the use of reassurance and support, clarification of cognitive and affective triggers that ignite conflicts, and active problem-solving (or assign "Applying Problem-Solving to Interpersonal Conflict" in the Adult Psychotherapy Homework Planner, 2nd ed. by  Stephannie Li). Objective Increase social contacts and communicate needs within existing interpersonal relationships. Target Date: 2022-02-15 Frequency: Biweekly  Progress: 80 Modality: individual  Objective Identify and replace cognitive self-talk that supports depression. Target Date: 2022-02-15 Frequency: Biweekly  Progress: 60 Modality: individual  Related Interventions Encourage the client to discuss cognitive distortions, including automatic thoughts (e.g., negative view of self, future, experience) and negative schemas (e.g., core beliefs about self and others based on earlier childhood experiences); assess frequency of negative self-statements associated with depression. Objective Increase the frequency of engaging in pleasant activities. Target Date: 2022-02-15 Frequency: Biweekly  Progress: 50 Modality: individual  Related Interventions Monitor and encourage the client to attend to personal grooming and healthy sleeping and eating patterns; reinforce improvement. Periodically assess and monitor the client for suicide potential; intervene as necessary. Objective Implement assertiveness to communicate needs and desires to others. Target Date: 2022-02-15 Frequency: Biweekly  Progress: 70 Modality: individual  Objective Implement conflict resolution skills to alleviate interpersonal sources of depressed mood. Target Date: 2022-02-15 Frequency: Biweekly  Progress: 50 Modality: individual  4. Improve satisfaction and comfort surrounding coworker relationships. 5. Increase sense of confidence and competence in dealing with work responsibilities. Objective Develop and verbalize a plan for constructive action to reduce vocational stress. Target Date: 2022-02-15 Frequency: Biweekly  Progress: 0 Modality: individual  Related Interventions Assist the client in developing a plan to react positively to his/her vocational situation (or assign "My Vocational Action Plan" in the Adult  Psychotherapy Homework Planner by [facility]); process the proactive plan and assist in its implementation. Objective Review family-of-origin history to determine roots for interpersonal conflict. Target Date: 2022-02-15 Frequency: Biweekly  Progress: 0 Modality: individual  Related Interventions Probe the client's family-of-origin history for  causes of current interpersonal conflict patterns that are being reenacted in the work setting. Objective Replace projection of responsibility for the conflict with acceptance of responsibility for own role in conflict. Target Date: 2022-02-15 Frequency: Biweekly  Progress: 0 Modality: individual  Related Interventions Confront the client's projection of responsibility for his/her behavior and feelings onto others; emphasize his/her need to examine his/her own role in the conflict. Reinforce the client's acceptance of responsibility for personal feelings and behavior as they contribute to the conflict in the work setting. 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 Target Date: 2022-02-15 Frequency: Biweekly  Progress: 0 Modality: individual  Related Interventions Clarify the nature of the client's conflicts in the work setting. Help the client identify his/her own role in the conflict, attempting to represent the other party's point of view. Objective Learn and implement problem-solving skills. Target Date: 2022-02-15 Frequency: Biweekly  Progress: 0 Modality: individual  Related Interventions Conduct Problem-Solving Therapy (see Problem-Solving Therapy by Domenick Bookbinder and Rob Hickman) using techniques such as psychoeducation, modeling, and role-playing to teach the client problem-solving skills (i.e., defining a problem specifically, generating possible solutions, evaluating the pros and cons of each solution, selecting and implementing a plan of action, evaluating the efficacy of the plan, accepting or revising the plan); role-play  application of the problem-solving skill to a real life issue (or assign "Applying Problem-Solving to Interpersonal Conflict" in the Adult Psychotherapy Homework Planner by Stephannie Li). Objective Identify patterns of similar conflict with people outside the work environment. Target Date: 2022-02-15 Frequency: Biweekly  Progress: 0 Modality: individual  Related Interventions Explore the client's patterns of interpersonal conflict that occur beyond the work setting but are repeated in the work setting. 6. Resolve the core conflict that is the source of anxiety. 7. Resolve the loss, reengaging in old relationships and initiating new contacts with others. Objective Express thoughts and feelings about the deceased that went unexpressed while the deceased was alive. Target Date: 2022-02-15 Frequency: Biweekly  Progress: 0 Modality: individual  Related Interventions Conduct an empty-chair exercise with the client where he/she focuses on expressing to the lost loved one imagined in the chair what he/she never said while that loved one was alive. Ask the client to write a letter to the lost person describing his/her fond memories and/or painful and regretful memories, and how he/she currently feels life (or assign "Dear : A Letter to a Lost Loved One" in the Adult Psychotherapy Homework Planner by [facility]); process the letter in session. Assign the client to visit the grave of the lost loved one to "talk to" the deceased and express his/her feelings. Objective Identify how avoiding dealing with loss has negatively impacted life. Target Date: 2022-02-15 Frequency: Biweekly  Progress: 0 Modality: individual  Related Interventions Ask the client to list ways that avoidance of grieving has negatively impacted his/her life. 8. Understand personal desires, insecurities, and anxieties that make overspending possible.  Diagnosis:Major depressive disorder, recurrent episode, moderate (HCC)  Generalized anxiety  disorder  Plan:  -meet again on Saturday, March 27, 2021 at 10am  [place], [facility]

## 2021-03-13 ENCOUNTER — Ambulatory Visit: Payer: BC Managed Care – PPO | Admitting: Professional

## 2021-03-27 ENCOUNTER — Encounter: Payer: Self-pay | Admitting: Professional

## 2021-03-27 ENCOUNTER — Ambulatory Visit (INDEPENDENT_AMBULATORY_CARE_PROVIDER_SITE_OTHER): Payer: BC Managed Care – PPO | Admitting: Professional

## 2021-03-27 DIAGNOSIS — F331 Major depressive disorder, recurrent, moderate: Secondary | ICD-10-CM

## 2021-03-27 DIAGNOSIS — F411 Generalized anxiety disorder: Secondary | ICD-10-CM

## 2021-03-27 NOTE — Progress Notes (Signed)
[facility] Counselor/Therapist Progress Note  Patient ID: LOAN OGUIN, MRN: [id number],    Date: 03/27/2021  Time Spent: 42 minutes 10-1050 am  Treatment Type: Individual Therapy  Reported Symptoms: mild anxiety regarding work  Mental Status Exam: Appearance:  Well Groomed     Behavior: Appropriate and Sharing  Motor: Normal  Speech/Language:  Clear and Coherent and Normal Rate  Affect: Full Range  Mood: normal  Thought process: goal directed  Thought content:   WNL  Sensory/Perceptual disturbances:   WNL  Orientation: oriented to person, place, time/date, and situation  Attention: Good  Concentration: Good  Memory: WNL  Fund of knowledge:  Good  Insight:   Good  Judgment:  Good  Impulse Control: Good   Risk Assessment: Danger to Self:  No Self-injurious Behavior: No Danger to Others: No Duty to Warn:no Physical Aggression / Violence:No  Access to Firearms a concern: No  Gang Involvement:No   Subjective: This session was held via video teletherapy due to the coronavirus risk at this time. The patient consented to video teletherapy and was located at her home during this session. She is aware it is the responsibility of the patient to secure confidentiality on her end of the session. The provider was in a private home office for the duration of this session.   The patient arrived on time for her webex appointment appearing casually groomed and easily engaged.  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   -will meet again in a month -provided pt resource for non-profits 3-self-esteem/self-care -was able to see what she has done  -is able to acknowledge her accomplishments -she is trying ti instill positives in her children -her old dean friend commented that she is happy to see her taking care of herself 4-son -finished writing his book -has began coaching 6th graders [employer] -applying for job at [employer] -will be teaching young men through basketball about his experience   -he has already rented a space to begin his program 5-physical -MD appt this week   -she has gained weight   -her MD referred her to the gym at [employer] -puppy Simonne Come has given him purpose -pt interested in muscle strengthening 6-grief -still impacted by the feelings of rejection from her (deceased) mother -this week was hard -pt trying to be intentional in her relationships with the people still in her life   -end of March going to visit her parents to make sure she they are okay   -going to bff's family reunion in [place] over the 4th of July -pans to be more intentional about how to invests her time and money   -after seeing her mother's hoarding issues of things that were never opened but were not salvageable 7-PHQ-9/GAD-7 Date  PHQ-9  GAD-7 07/01/2018   17     18 03/27/2021  4      5  Treatment Plan Problems Addressed-Anxiety, Depression, Grief / Loss Unresolved, Vocational Stress  Goals 1. Develop an awareness of how the avoidance of grieving has affected life and begin the healing process. 2. Enhance ability to effectively cope with the full variety of life's worries and anxieties. Objective Complete a Cost Benefit Analysis of maintaining the anxiety. Target Date: 2022-02-15 Frequency: Biweekly  Progress: 0 Modality: individual  Related  Interventions Ask the client to evaluate the costs and benefits of worries (e.g., complete the Cost Benefit Analysis exercise in Ten Days to Self-Esteem! by Lawerance Bach) in which he/she lists the advantages and disadvantages of the negative thought, fear, or anxiety; process the completed assignment. Objective Describe situations, thoughts, feelings, and actions associated with anxieties and worries, their impact on functioning, and attempts to resolve them. Target Date: 2022-02-15 Frequency: Biweekly  Progress: 0 Modality: individual  Related Interventions Focus on developing a level of trust with the client; provide support and empathy to encourage the client to feel safe in expressing his/her GAD symptoms. Ask the client to describe his/her past experiences of anxiety and their impact on functioning; assess the focus, excessiveness, and uncontrollability of the worry and the type, frequency, intensity, and duration of his/her anxiety symptoms (consider using a structured interview such as The Anxiety Disorders Interview Schedule-Adult Version). Objective Identify the major life conflicts from the past and present that form the basis for present anxiety. Target Date: 2022-02-15 Frequency: Biweekly  Progress: 0 Modality: individual  Related Interventions Ask the client to develop and process a list of key past and present life conflicts that continue to cause worry. Reinforce the client's insights into the role of his/her past emotional pain and present anxiety. Assist the client in becoming aware of key unresolved life conflicts and in starting to work toward their resolution. 3. Identify and increase intrapersonal, interpersonal, and physical resources to foster positive coping strategies. Objective Articulate self-care methods to prevent and/or manage depression in the future; create a list of 10 coping methods for future use. Target Date: 2022-02-15 Frequency: Biweekly  Progress: 60 Modality:  individual  Related Interventions Teach the client conflict resolution skills (e.g., empathy, active listening, "I messages," respectful communication, assertiveness without aggression, compromise) to help alleviate depression; use modeling, role-playing, and behavior rehearsal to work through several current conflicts. Explore the role of interpersonal conflict in maintaining the client's depression; clarify the sources and nature of these conflicts. Assign the client to write letters to individuals who may be contributing to her distress and depression, asking that she outline what she wants in the letters; process the letters, generating solutions within an interpersonal context. Help the client resolve depression related to interpersonal problems through the use of reassurance and support, clarification of cognitive and affective triggers that ignite conflicts, and active problem-solving (or assign "Applying Problem-Solving to Interpersonal Conflict" in the Adult Psychotherapy Homework Planner, 2nd ed. by Stephannie Li). Objective Increase social contacts and communicate needs within existing interpersonal relationships. Target Date: 2022-02-15 Frequency: Biweekly  Progress: 80 Modality: individual  Objective Identify and replace cognitive self-talk that supports depression. Target Date: 2022-02-15 Frequency: Biweekly  Progress: 60 Modality: individual  Related Interventions Encourage the client to discuss cognitive distortions, including automatic thoughts (e.g., negative view of self, future, experience) and negative schemas (e.g., core beliefs about self and others based on earlier childhood experiences); assess frequency of negative self-statements associated with depression. Objective Increase the frequency of engaging in pleasant activities. Target Date: 2022-02-15 Frequency: Biweekly  Progress:  50 Modality: individual  Related Interventions Monitor and encourage the client to attend to  personal grooming and healthy sleeping and eating patterns; reinforce improvement. Periodically assess and monitor the client for suicide potential; intervene as necessary. Objective Implement assertiveness to communicate needs and desires to others. Target Date: 2022-02-15 Frequency: Biweekly  Progress: 70 Modality: individual  Objective Implement conflict resolution skills to alleviate interpersonal sources of depressed mood. Target Date: 2022-02-15 Frequency: Biweekly  Progress: 50 Modality: individual  4. Improve satisfaction and comfort surrounding coworker relationships. 5. Increase sense of confidence and competence in dealing with work responsibilities. Objective Develop and verbalize a plan for constructive action to reduce vocational stress. Target Date: 2022-02-15 Frequency: Biweekly  Progress: 0 Modality: individual  Related Interventions Assist the client in developing a plan to react positively to his/her vocational situation (or assign "My Vocational Action Plan" in the Adult Psychotherapy Homework Planner by [facility]); process the proactive plan and assist in its implementation. Objective Review family-of-origin history to determine roots for interpersonal conflict. Target Date: 2022-02-15 Frequency: Biweekly  Progress: 0 Modality: individual  Related Interventions Probe the client's family-of-origin history for causes of current interpersonal conflict patterns that are being reenacted in the work setting. Objective Replace projection of responsibility for the conflict with acceptance of responsibility for own role in conflict. Target Date: 2022-02-15 Frequency: Biweekly  Progress: 0 Modality: individual  Related Interventions Confront the client's projection of responsibility for his/her behavior and feelings onto others; emphasize his/her need to examine his/her own role in the conflict. Reinforce the client's acceptance of responsibility for personal feelings and  behavior as they contribute to the conflict in the work setting. 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 Target Date: 2022-02-15 Frequency: Biweekly  Progress: 0 Modality: individual  Related Interventions Clarify the nature of the client's conflicts in the work setting. Help the client identify his/her own role in the conflict, attempting to represent the other party's point of view. Objective Learn and implement problem-solving skills. Target Date: 2022-02-15 Frequency: Biweekly  Progress: 0 Modality: individual  Related Interventions Conduct Problem-Solving Therapy (see Problem-Solving Therapy by Domenick Bookbinder and Rob Hickman) using techniques such as psychoeducation, modeling, and role-playing to teach the client problem-solving skills (i.e., defining a problem specifically, generating possible solutions, evaluating the pros and cons of each solution, selecting and implementing a plan of action, evaluating the efficacy of the plan, accepting or revising the plan); role-play application of the problem-solving skill to a real life issue (or assign "Applying Problem-Solving to Interpersonal Conflict" in the Adult Psychotherapy Homework Planner by Stephannie Li). Objective Identify patterns of similar conflict with people outside the work environment. Target Date: 2022-02-15 Frequency: Biweekly  Progress: 0 Modality: individual  Related Interventions Explore the client's patterns of interpersonal conflict that occur beyond the work setting but are repeated in the work setting. 6. Resolve the core conflict that is the source of anxiety. 7. Resolve the loss, reengaging in old relationships and initiating new contacts with others. Objective Express thoughts and feelings about the deceased that went unexpressed while the deceased was alive. Target Date: 2022-02-15 Frequency: Biweekly  Progress: 0 Modality: individual  Related Interventions Conduct an empty-chair exercise with  the client where he/she focuses on expressing to the lost loved one imagined in the chair what he/she never said while that loved one was alive. Ask the client to write a letter to the lost person describing his/her fond memories and/or painful and regretful memories, and how he/she currently feels life (or assign "Dear :  A Letter to a Lost Loved One" in the Adult Psychotherapy Homework Planner by Stephannie LiJongsma); process the letter in session. Assign the client to visit the grave of the lost loved one to "talk to" the deceased and express his/her feelings. Objective Identify how avoiding dealing with loss has negatively impacted life. Target Date: 2022-02-15 Frequency: Biweekly  Progress: 0 Modality: individual  Related Interventions Ask the client to list ways that avoidance of grieving has negatively impacted his/her life. 8. Understand personal desires, insecurities, and anxieties that make overspending possible.  Diagnosis:Major depressive disorder, recurrent episode, moderate (HCC)  Generalized anxiety disorder  Plan:  -meet again on Saturday, April 10, 2021 at 10am  [facility]athy Marce [place], [facility]

## 2021-04-10 ENCOUNTER — Ambulatory Visit: Payer: BC Managed Care – PPO | Admitting: Professional

## 2021-04-24 ENCOUNTER — Ambulatory Visit (INDEPENDENT_AMBULATORY_CARE_PROVIDER_SITE_OTHER): Payer: BC Managed Care – PPO | Admitting: Professional

## 2021-04-24 ENCOUNTER — Encounter: Payer: Self-pay | Admitting: Professional

## 2021-04-24 DIAGNOSIS — F411 Generalized anxiety disorder: Secondary | ICD-10-CM | POA: Diagnosis not present

## 2021-04-24 DIAGNOSIS — F331 Major depressive disorder, recurrent, moderate: Secondary | ICD-10-CM | POA: Diagnosis not present

## 2021-04-24 NOTE — Progress Notes (Addendum)
[facility] Counselor/Therapist Progress Note ? ?Patient ID: Maureen [name], MRN: [id number],   ? ?Date: 04/24/2021 ? ?Time Spent: 41 minutes 1003-1044 am ? ?Treatment Type: Individual Therapy ? ?Risk Assessment: ?Danger to Self:  No ?Self-injurious Behavior: No ?Danger to Others: No ? ?Subjective: This session was held via video teletherapy due to the coronavirus risk at this time. The patient consented to video teletherapy and was located at her home during this session. She is aware it is the responsibility of the patient to secure confidentiality on her end of the session. The provider was in a private home office for the duration of this session.  ? ?The patient arrived on time for her webex appointment. ? ?Issues addressed: ?1- professional ?-on spring break beginning yesterday ?-has been so tired mentally and physically ?2-grief ?-still struggling with her financial abandonment with her mom ?  -her mom left her noting ?-she is comforted that her children did get inheritance ?  -ht helped "jump start" her children ?-really thankful to her mother for having ?-she spoke to her brother ?  -talked about the money ?  -he was left $46K ?  -he said he couldn't give her any money ?  -he said he had too many bills ?  -she said to do what is right and eventually stopped listening and hung up the phone ?  -he committed to doing something after repeated texts ?    -he never sent her anything and has not had any contact ?-uncle's death ?  -pt cries more about her uncle's passing then with her other's death ?  -pt thinks time will hep ?  -she thinks of a lot of the good stuff ?  -he is the person that brought her mom to her wedding and other family members ?  -going to [place] last week in June and plans to spent some time with her aunt ?  -how to call her uncle and engage conversation ?-on Mother's Day weekend she is going to plant a tree for her mother and for her uncle who passed ?  -she is going to do the  planting of the trees on her own ?-discussed other ways to grieve ?  -going through pictures ?  -consider getting a plaque to place under tress and have lighting ?  -shadow box ?-tapping in to her spirituality as support ?-Orphaned Adults support group ?3-self-esteem/self-care ?-Frankey Poot, her dog, had surgery and is slowing her down ?-taking her spring break and relaxing ?4-family ?-son moved out this past week and is living with a friend ?-daughter moved out with her boyfriend a few weeks ago ?-youngest Evelena Peat is only one who still lives at home and she is seldom home ?  -break up with boyfriend again ?-her father made contact with the children to remind them of their stepmother's birthday ?  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  -planning to rent a farm ?-pt working on mission and vision statement to present to the board ?-met with board members ?  -all seven will bring an acton item back to the May board meeting ?  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  -wants to grow program ? ?Treatment Plan ?Problems Addressed-Anxiety, Depression, Grief / Loss Unresolved, Vocational Stress  ?Goals ?1. Develop an awareness of how the avoidance of grieving has affected life and begin the healing process. ?  2. Enhance ability to effectively cope with the full variety of life's worries and anxieties. ?Objective ?Complete a Cost Benefit Analysis of maintaining the anxiety. ?Target Date: 2022-02-15 Frequency: Biweekly  ?Progress: 0 Modality: individual  ?Related Interventions ?Ask the client to evaluate the costs and benefits of worries (e.g., complete the Cost Benefit Analysis exercise in Ten Days to Self-Esteem! by Quay Burow) in which he/she lists the advantages and disadvantages of the negative thought, fear, or anxiety; process the completed assignment. ?Objective ?Describe situations, thoughts, feelings, and actions associated with  anxieties and worries, their impact on functioning, and attempts to resolve them. ?Target Date: 2022-02-15 Frequency: Biweekly  ?Progress: 0 Modality: individual  ?Related Interventions ?Focus on developing a level of trust with the client; provide support and empathy to encourage the client to feel safe in expressing his/her GAD symptoms. ?Ask the client to describe his/her past experiences of anxiety and their impact on functioning; assess the focus, excessiveness, and uncontrollability of the worry and the type, frequency, intensity, and duration of his/her anxiety symptoms (consider using a structured interview such as The Anxiety Disorders Interview Schedule-Adult Version). ?Objective ?Identify the major life conflicts from the past and present that form the basis for present anxiety. ?Target Date: 2022-02-15 Frequency: Biweekly  ?Progress: 0 Modality: individual  ?Related Interventions ?Ask the client to develop and process a list of key past and present life conflicts that continue to cause worry. ?Reinforce the client's insights into the role of his/her past emotional pain and present anxiety. ?Assist the client in becoming aware of key unresolved life conflicts and in starting to work toward their resolution. ?3. Identify and increase intrapersonal, interpersonal, and physical resources to foster positive coping strategies. ?Objective ?Articulate self-care methods to prevent and/or manage depression in the future; create a list of 10 coping methods for future use. ?Target Date: 2022-02-15 Frequency: Biweekly  ?Progress: 60 Modality: individual  ?Related Interventions ?Teach the client conflict resolution skills (e.g., empathy, active listening, "I messages," respectful communication, assertiveness without aggression, compromise) to help alleviate depression; use modeling, role-playing, and behavior rehearsal to work through several current conflicts. ?Explore the role of interpersonal conflict in  maintaining the client's depression; clarify the sources and nature of these conflicts. ?Assign the client to write letters to individuals who may be contributing to her distress and depression, asking that she outline what she wants in the letters; process the letters, generating solutions within an interpersonal context. ?Help the client resolve depression related to interpersonal problems through the use of reassurance and support, clarification of cognitive and affective triggers that ignite conflicts, and active problem-solving (or assign "Applying Problem-Solving to Interpersonal Conflict" in the Adult Psychotherapy Homework Planner, 2nd ed. by Bryn Gulling). ?Objective ?Increase social contacts and communicate needs within existing interpersonal relationships. ?Target Date: 2022-02-15 Frequency: Biweekly  ?Progress: 80 Modality: individual  ?Objective ?Identify and replace cognitive self-talk that supports depression. ?Target Date: 2022-02-15 Frequency: Biweekly  ?Progress: 60 Modality: individual  ?Related Interventions ?Encourage the client to discuss cognitive distortions, including automatic thoughts (e.g., negative view of self, future, experience) and negative schemas (e.g., core beliefs about self and others based on earlier childhood experiences); assess frequency of negative self-statements associated with depression. ?Objective ?Increase the frequency of engaging in pleasant activities. ?Target Date: 2022-02-15 Frequency: Biweekly  ?Progress: 50 Modality: individual  ?Related Interventions ?Monitor and encourage the client to attend to personal grooming and healthy sleeping and eating patterns; reinforce improvement. ?Periodically assess and monitor the client for suicide potential; intervene as necessary. ?Objective ?Implement assertiveness  to communicate needs and desires to others. ?Target Date: 2022-02-15 Frequency: Biweekly  ?Progress: 70 Modality: individual  ?Objective ?Implement conflict  resolution skills to alleviate interpersonal sources of depressed mood. ?Target Date: 2022-02-15 Frequency: Biweekly  ?Progress: 50 Modality: individual  ?4. Improve satisfaction and comfort surrounding coworker relationships

## 2021-04-24 NOTE — Addendum Note (Signed)
Addended by: Ardyth Man on: 04/24/2021 10:47 AM ? ? Modules accepted: Level of Service ? ?

## 2021-05-08 ENCOUNTER — Ambulatory Visit (INDEPENDENT_AMBULATORY_CARE_PROVIDER_SITE_OTHER): Payer: BC Managed Care – PPO | Admitting: Professional

## 2021-05-08 ENCOUNTER — Encounter: Payer: Self-pay | Admitting: Professional

## 2021-05-08 DIAGNOSIS — F411 Generalized anxiety disorder: Secondary | ICD-10-CM | POA: Diagnosis not present

## 2021-05-08 DIAGNOSIS — F331 Major depressive disorder, recurrent, moderate: Secondary | ICD-10-CM | POA: Diagnosis not present

## 2021-05-08 NOTE — Progress Notes (Signed)
[facility] Counselor/Therapist Progress Note ? ?Patient ID: Maureen [name], MRN: [id number],   ? ?Date: 05/08/2021 ? ?Time Spent: 48 minutes 1003-1051 am ? ?Treatment Type: Individual Therapy ? ?Risk Assessment: ?Danger to Self:  No ?Self-injurious Behavior: No ?Danger to Others: No ? ?Subjective: This session was held via video teletherapy due to the coronavirus risk at this time. The patient consented to video teletherapy and was located at her home during this session. She is aware it is the responsibility of the patient to secure confidentiality on her end of the session. The provider was in a private home office for the duration of this session.  ? ?The patient arrived on time for her webex appointment. ? 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  -easier to banter with them ?2-grief ?-she is sad and has been crying more often ?-she realizes that through all the losses she has distanced herself ?-feeling isolated ?3-social ?-she does not feel connected ?-unsure if she wants to invest because of the fear of losing them ?-pt describes losing relationships as traumatic ?  -lost solid friendships in church and outside of church ?  -the connections changed when she separated from her spouse and moved to [facility] ?-unsure if she wants to do the work ?-she no longer feeling lonely but is alone ?  -she likes being alone less often ?  -she gets out of her house on the weekends both days so she is not by herself ?  -she is considering taking an art class this summer ?  -she connects with her childhood best friend and with her (step)mother ?4-family ?-has no family in the area and came to [facility] for her children to have the connection ? ?Treatment Plan ?Problems Addressed-Anxiety, Depression, Grief / Loss Unresolved, Vocational Stress  ?Goals ?1. Develop an awareness of how the avoidance of grieving has affected life and begin the healing process. ?2. Enhance ability to effectively  cope with the full variety of life's worries and anxieties. ?Objective ?Complete a Cost Benefit Analysis of maintaining the anxiety. ?Target Date: 2022-02-15 Frequency: Biweekly  ?Progress: 0 Modality: individual  ?Related Interventions ?Ask the client to evaluate the costs and benefits of worries (e.g., complete the Cost Benefit Analysis exercise in Ten Days to Self-Esteem! by Lawerance Bach) in which he/she lists the advantages and disadvantages of the negative thought, fear, or anxiety; process the completed assignment. ?Objective ?Describe situations, thoughts, feelings, and actions associated with anxieties and worries, their impact on functioning, and attempts to resolve them. ?Target Date: 2022-02-15 Frequency: Biweekly  ?Progress: 0 Modality: individual  ?Related Interventions ?Focus on developing a level of trust with the client; provide support and empathy to encourage the client to feel safe in expressing his/her GAD symptoms. ?Ask the client to describe his/her past experiences of anxiety and their impact on functioning; assess the focus, excessiveness, and uncontrollability of the worry and the type, frequency, intensity, and duration of his/her anxiety symptoms (consider using a structured interview such as The Anxiety Disorders Interview Schedule-Adult Version). ?Objective ?Identify the major life conflicts from the past and present that form the basis for present anxiety. ?Target Date: 2022-02-15 Frequency: Biweekly  ?Progress: 0 Modality: individual  ?Related Interventions ?Ask the client to develop and process a list of key past and present life conflicts that continue to cause worry. ?Reinforce the client's insights into the role of his/her past emotional pain and present anxiety. ?Assist the client in becoming aware of key unresolved life  conflicts and in starting to work toward their resolution. ?3. Identify and increase intrapersonal, interpersonal, and physical resources to foster positive coping  strategies. ?Objective ?Articulate self-care methods to prevent and/or manage depression in the future; create a list of 10 coping methods for future use. ?Target Date: 2022-02-15 Frequency: Biweekly  ?Progress: 60 Modality: individual  ?Related Interventions ?Teach the client conflict resolution skills (e.g., empathy, active listening, "I messages," respectful communication, assertiveness without aggression, compromise) to help alleviate depression; use modeling, role-playing, and behavior rehearsal to work through several current conflicts. ?Explore the role of interpersonal conflict in maintaining the client's depression; clarify the sources and nature of these conflicts. ?Assign the client to write letters to individuals who may be contributing to her distress and depression, asking that she outline what she wants in the letters; process the letters, generating solutions within an interpersonal context. ?Help the client resolve depression related to interpersonal problems through the use of reassurance and support, clarification of cognitive and affective triggers that ignite conflicts, and active problem-solving (or assign "Applying Problem-Solving to Interpersonal Conflict" in the Adult Psychotherapy Homework Planner, 2nd ed. by Stephannie Li). ?Objective ?Increase social contacts and communicate needs within existing interpersonal relationships. ?Target Date: 2022-02-15 Frequency: Biweekly  ?Progress: 80 Modality: individual  ?Objective ?Identify and replace cognitive self-talk that supports depression. ?Target Date: 2022-02-15 Frequency: Biweekly  ?Progress: 60 Modality: individual  ?Related Interventions ?Encourage the client to discuss cognitive distortions, including automatic thoughts (e.g., negative view of self, future, experience) and negative schemas (e.g., core beliefs about self and others based on earlier childhood experiences); assess frequency of negative self-statements associated with  depression. ?Objective ?Increase the frequency of engaging in pleasant activities. ?Target Date: 2022-02-15 Frequency: Biweekly  ?Progress: 50 Modality: individual  ?Related Interventions ?Monitor and encourage the client to attend to personal grooming and healthy sleeping and eating patterns; reinforce improvement. ?Periodically assess and monitor the client for suicide potential; intervene as necessary. ?Objective ?Implement assertiveness to communicate needs and desires to others. ?Target Date: 2022-02-15 Frequency: Biweekly  ?Progress: 70 Modality: individual  ?Objective ?Implement conflict resolution skills to alleviate interpersonal sources of depressed mood. ?Target Date: 2022-02-15 Frequency: Biweekly  ?Progress: 50 Modality: individual  ?4. Improve satisfaction and comfort surrounding coworker relationships. ?5. Increase sense of confidence and competence in dealing with work responsibilities. ?Objective ?Develop and verbalize a plan for constructive action to reduce vocational stress. ?Target Date: 2022-02-15 Frequency: Biweekly  ?Progress: 0 Modality: individual  ?Related Interventions ?Assist the client in developing a plan to react positively to his/her vocational situation (or assign "My Vocational Action Plan" in the Adult Psychotherapy Homework Planner by [facility]); process the proactive plan and assist in its implementation. ?Objective ?Review family-of-origin history to determine roots for interpersonal conflict. ?Target Date: 2022-02-15 Frequency: Biweekly  ?Progress: 0 Modality: individual  ?Related Interventions ?Probe the client's family-of-origin history for causes of current interpersonal conflict patterns that are being reenacted in the work setting. ?Objective ?Replace projection of responsibility for the conflict with acceptance of responsibility for own role in conflict. ?Target Date: 2022-02-15 Frequency: Biweekly  ?Progress: 0 Modality: individual  ?Related Interventions ?Confront the  client's projection of responsibility for his/her behavior and feelings onto others; emphasize his/her need to examine his/her own role in the conflict. ?Reinforce the client's acceptance of responsibility for personal fe

## 2021-06-12 ENCOUNTER — Ambulatory Visit (INDEPENDENT_AMBULATORY_CARE_PROVIDER_SITE_OTHER): Payer: BC Managed Care – PPO | Admitting: Professional

## 2021-06-12 ENCOUNTER — Encounter: Payer: Self-pay | Admitting: Professional

## 2021-06-12 DIAGNOSIS — F331 Major depressive disorder, recurrent, moderate: Secondary | ICD-10-CM | POA: Diagnosis not present
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
## 2021-06-26 ENCOUNTER — Ambulatory Visit: Payer: BC Managed Care – PPO | Admitting: Professional

## 2021-08-07 ENCOUNTER — Ambulatory Visit (INDEPENDENT_AMBULATORY_CARE_PROVIDER_SITE_OTHER): Payer: BC Managed Care – PPO | Admitting: Professional

## 2021-08-07 ENCOUNTER — Encounter: Payer: Self-pay | Admitting: Professional

## 2021-08-07 DIAGNOSIS — F331 Major depressive disorder, recurrent, moderate: Secondary | ICD-10-CM | POA: Diagnosis not present

## 2021-08-07 DIAGNOSIS — F411 Generalized anxiety disorder: Secondary | ICD-10-CM
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
## 2021-08-21 ENCOUNTER — Ambulatory Visit (INDEPENDENT_AMBULATORY_CARE_PROVIDER_SITE_OTHER): Payer: BC Managed Care – PPO | Admitting: Professional

## 2021-08-21 ENCOUNTER — Encounter: Payer: Self-pay | Admitting: Professional

## 2021-08-21 DIAGNOSIS — F331 Major depressive disorder, recurrent, moderate: Secondary | ICD-10-CM | POA: Diagnosis not present

## 2021-08-21 DIAGNOSIS — F411 Generalized anxiety disorder: Secondary | ICD-10-CM | POA: Diagnosis not present

## 2021-08-21 NOTE — Progress Notes (Signed)
[facility] Counselor/Therapist Progress Note  Patient ID: Maureen [name], MRN: [id number],    Date: 08/21/2021  Time Spent: 48 minutes 1003-1051am  Treatment Type: Individual Therapy  Risk Assessment: Danger to Self:  No Self-injurious Behavior: No Danger to Others: No  Subjective: This session was held via video teletherapy. The patient consented to video teletherapy and was located at her home during this session. She is aware it is the responsibility of the patient to secure confidentiality on her end of the session. The provider was in a private home office for the duration of this session.   The patient arrived late for her webex appointment.  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   -"God has a way" when her Principal ran the meeting "because she obviously didn't trust [name]"   -the Principal did not recognize that the new dean did not understand what she is doing     -instead she chastised the deans for not having it done -pt is overwhelmed by the tasks   -all deans were given a list of 30 tasks   -pt admits she has been praying about it and God has told her that she would need to finish at 100% -pt will meet with her Principal on Monday for her Task List Meeting   -pt admits that she needs to address her issues with the work load -discussed how to help the pt feel more in control -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 -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   -pt admits she learned to pray and find out what makes her  great in the position   -pt admits that she moves through leaning what God has given her   -she thinks this year she would like to focus on Project-Based Learning and 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     -"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" -potential to get out of last six months of 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   -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 been compromised at each administrative position   -if they cannot write it off due to her mental health can it be prorated to only pay back the last six months 2-self-care -leaves on Friday for a visit with her mom and dad -dad's varicose vein exploded severe weeks ago   -ED visit and visit to specialist -pt is in the place where she will not let anything take her peace -pt can take control of her life and does not have to do anything she does not want to   -personally-children are all adults   -professionally-she can find another job   -there is nothing that keeps her in [place] 3-personal a-pt has been so emotional related to loss of uncle and mother -she had a good visit to [place] but coming home has been difficult  Treatment Plan Problems Addressed-Anxiety, Depression, Grief / Loss Unresolved, Vocational Stress  Goals 1. Develop an awareness of how the avoidance of grieving has affected life and begin the healing process. 2.  Enhance ability to effectively cope with the full variety of life's worries and anxieties. Objective Complete a Cost Benefit Analysis of maintaining the anxiety. Target Date: 2022-02-15 Frequency: Biweekly  Progress: 0 Modality: individual  Related Interventions Ask the client to evaluate the costs and benefits of worries (e.g., complete the Cost Benefit Analysis exercise in Ten Days to Self-Esteem! by Lawerance Bach) in which he/she lists the advantages and disadvantages of the negative thought, fear, or anxiety; process the completed assignment. Objective Describe situations, thoughts, feelings, and actions associated with anxieties  and worries, their impact on functioning, and attempts to resolve them. Target Date: 2022-02-15 Frequency: Biweekly  Progress: 0 Modality: individual  Related Interventions Focus on developing a level of trust with the client; provide support and empathy to encourage the client to feel safe in expressing his/her GAD symptoms. Ask the client to describe his/her past experiences of anxiety and their impact on functioning; assess the focus, excessiveness, and uncontrollability of the worry and the type, frequency, intensity, and duration of his/her anxiety symptoms (consider using a structured interview such as The Anxiety Disorders Interview Schedule-Adult Version). Objective Identify the major life conflicts from the past and present that form the basis for present anxiety. Target Date: 2022-02-15 Frequency: Biweekly  Progress: 0 Modality: individual  Related Interventions Ask the client to develop and process a list of key past and present life conflicts that continue to cause worry. Reinforce the client's insights into the role of his/her past emotional pain and present anxiety. Assist the client in becoming aware of key unresolved life conflicts and in starting to work toward their resolution. 3. Identify and increase intrapersonal, interpersonal, and physical resources to foster positive coping strategies. Objective Articulate self-care methods to prevent and/or manage depression in the future; create a list of 10 coping methods for future use. Target Date: 2022-02-15 Frequency: Biweekly  Progress: 60 Modality: individual  Related Interventions Teach the client conflict resolution skills (e.g., empathy, active listening, "I messages," respectful communication, assertiveness without aggression, compromise) to help alleviate depression; use modeling, role-playing, and behavior rehearsal to work through several current conflicts. Explore the role of interpersonal conflict in maintaining the  client's depression; clarify the sources and nature of these conflicts. Assign the client to write letters to individuals who may be contributing to her distress and depression, asking that she outline what she wants in the letters; process the letters, generating solutions within an interpersonal context. Help the client resolve depression related to interpersonal problems through the use of reassurance and support, clarification of cognitive and affective triggers that ignite conflicts, and active problem-solving (or assign "Applying Problem-Solving to Interpersonal Conflict" in the Adult Psychotherapy Homework Planner, 2nd ed. by Stephannie Li). Objective Increase social contacts and communicate needs within existing interpersonal relationships. Target Date: 2022-02-15 Frequency: Biweekly  Progress: 80 Modality: individual  Objective Identify and replace cognitive self-talk that supports depression. Target Date: 2022-02-15 Frequency: Biweekly  Progress: 60 Modality: individual  Related Interventions Encourage the client to discuss cognitive distortions, including automatic thoughts (e.g., negative view of self, future, experience) and negative schemas (e.g., core beliefs about self and others based on earlier childhood experiences); assess frequency of negative self-statements associated with depression. Objective Increase the frequency of engaging in pleasant activities. Target Date: 2022-02-15 Frequency: Biweekly  Progress: 50 Modality: individual  Related Interventions Monitor and encourage the client to attend to personal grooming and healthy sleeping and eating patterns; reinforce improvement. Periodically assess and monitor the client for suicide potential; intervene as necessary. Objective Implement assertiveness to  communicate needs and desires to others. Target Date: 2022-02-15 Frequency: Biweekly  Progress: 70 Modality: individual  Objective Implement conflict resolution skills to  alleviate interpersonal sources of depressed mood. Target Date: 2022-02-15 Frequency: Biweekly  Progress: 50 Modality: individual  4. Improve satisfaction and comfort surrounding coworker relationships. 5. Increase sense of confidence and competence in dealing with work responsibilities. Objective Develop and verbalize a plan for constructive action to reduce vocational stress. Target Date: 2022-02-15 Frequency: Biweekly  Progress: 0 Modality: individual  Related Interventions Assist the client in developing a plan to react positively to his/her vocational situation (or assign "My Vocational Action Plan" in the Adult Psychotherapy Homework Planner by [facility]); process the proactive plan and assist in its implementation. Objective Review family-of-origin history to determine roots for interpersonal conflict. Target Date: 2022-02-15 Frequency: Biweekly  Progress: 0 Modality: individual  Related Interventions Probe the client's family-of-origin history for causes of current interpersonal conflict patterns that are being reenacted in the work setting. Objective Replace projection of responsibility for the conflict with acceptance of responsibility for own role in conflict. Target Date: 2022-02-15 Frequency: Biweekly  Progress: 0 Modality: individual  Related Interventions Confront the client's projection of responsibility for his/her behavior and feelings onto others; emphasize his/her need to examine his/her own role in the conflict. Reinforce the client's acceptance of responsibility for personal feelings and behavior as they contribute to the conflict in the work setting. 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 Target Date: 2022-02-15 Frequency: Biweekly  Progress: 0 Modality: individual  Related Interventions Clarify the nature of the client's conflicts in the work setting. Help the client identify his/her own role in the conflict, attempting to  represent the other party's point of view. Objective Learn and implement problem-solving skills. Target Date: 2022-02-15 Frequency: Biweekly  Progress: 0 Modality: individual  Related Interventions Conduct Problem-Solving Therapy (see Problem-Solving Therapy by Domenick Bookbinder and Rob Hickman) using techniques such as psychoeducation, modeling, and role-playing to teach the client problem-solving skills (i.e., defining a problem specifically, generating possible solutions, evaluating the pros and cons of each solution, selecting and implementing a plan of action, evaluating the efficacy of the plan, accepting or revising the plan); role-play application of the problem-solving skill to a real life issue (or assign "Applying Problem-Solving to Interpersonal Conflict" in the Adult Psychotherapy Homework Planner by Stephannie Li). Objective Identify patterns of similar conflict with people outside the work environment. Target Date: 2022-02-15 Frequency: Biweekly  Progress: 0 Modality: individual  Related Interventions Explore the client's patterns of interpersonal conflict that occur beyond the work setting but are repeated in the work setting. 6. Resolve the core conflict that is the source of anxiety. 7. Resolve the loss, reengaging in old relationships and initiating new contacts with others. Objective Express thoughts and feelings about the deceased that went unexpressed while the deceased was alive. Target Date: 2022-02-15 Frequency: Biweekly  Progress: 0 Modality: individual  Related Interventions Conduct an empty-chair exercise with the client where he/she focuses on expressing to the lost loved one imagined in the chair what he/she never said while that loved one was alive. Ask the client to write a letter to the lost person describing his/her fond memories and/or painful and regretful memories, and how he/she currently feels life (or assign "Dear : A Letter to a Lost Loved One" in the Adult Psychotherapy  Homework Planner by [facility]); process the letter in session. Assign the client to visit the grave of the lost loved one to "talk to" the deceased and express  his/her feelings. Objective Identify how avoiding dealing with loss has negatively impacted life. Target Date: 2022-02-15 Frequency: Biweekly  Progress: 0 Modality: individual  Related Interventions Ask the client to list ways that avoidance of grieving has negatively impacted his/her life. 8. Understand personal desires, insecurities, and anxieties that make overspending possible.  Diagnosis:Major depressive disorder, recurrent episode, moderate (HCC)  Generalized anxiety disorder  Plan:  -meet again on Saturday, September 25, 2021 at 10am  [place], [facility]

## 2021-08-25 ENCOUNTER — Encounter (INDEPENDENT_AMBULATORY_CARE_PROVIDER_SITE_OTHER): Payer: Self-pay

## 2021-09-04 ENCOUNTER — Ambulatory Visit: Payer: BC Managed Care – PPO | Admitting: Professional

## 2021-09-25 ENCOUNTER — Ambulatory Visit (INDEPENDENT_AMBULATORY_CARE_PROVIDER_SITE_OTHER): Payer: BC Managed Care – PPO | Admitting: Professional

## 2021-09-25 ENCOUNTER — Encounter: Payer: Self-pay | Admitting: Professional

## 2021-09-25 DIAGNOSIS — F411 Generalized anxiety disorder: Secondary | ICD-10-CM | POA: Diagnosis not present

## 2021-09-25 DIAGNOSIS — F988 Other specified behavioral and emotional disorders with onset usually occurring in childhood and adolescence: Secondary | ICD-10-CM | POA: Diagnosis not present

## 2021-09-25 DIAGNOSIS — F331 Major depressive disorder, recurrent, moderate: Secondary | ICD-10-CM | POA: Diagnosis not present

## 2021-09-25 DIAGNOSIS — F4001 Agoraphobia with panic disorder: Secondary | ICD-10-CM | POA: Diagnosis not present

## 2021-09-25 NOTE — Progress Notes (Signed)
[place] Counselor/Therapist Progress Note  Patient ID: Maureen [name], MRN: [id number],    Date: 09/25/2021  Time Spent: 48 minutes 10-1041am  Treatment Type: Individual Therapy  Risk Assessment: Danger to Self:  No Self-injurious Behavior: No Danger to Others: No  Subjective: This session was held via video teletherapy. The patient consented to video teletherapy and was located at her home during this session. She is aware it is the responsibility of the patient to secure confidentiality on her end of the session. The provider was in a private home office for the duration of this session.   The patient arrived late for her webex appointment.  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 Owens Shark was responsible for getting the electives in place for the new students   -pt placed the children in a classroom and asked them to write down the electives they wish to take     -Dr. Owens Shark yelled at her in front of her team and the children regarding not getting the children in electives -pt met with Dr. Owens Shark and told her that her brain cannot handle her yelling at her and knocking repeatedly on door -Dr. 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 Owens Shark scheduled a meeting for yesterday, her first day back   -told pt she needed to get her assignments covered when she is out -pt feels that she is on the chopping block the way her friend/former dean had been 2-coping -pt has been praying a lot   -when walking [place] in the morning she prays her scriptures -increased structure for the weekends 3-mood -pt endorses feelings of depression and anxiety -completed PHQ-9/GAD-7   -pt scored as moderately severely depressed and severely anxious    09/25/2021   10:28 AM 03/27/2021   10:33 AM  Depression screen PHQ 2/9  Decreased Interest 3 0  Down, Depressed, Hopeless 2 0  PHQ - 2 Score 5 0  Altered sleeping 3 0  Tired, decreased energy 3 1  Change in appetite 3 1  Feeling bad or  failure about yourself  2 0  Trouble concentrating 3 2  Moving slowly or fidgety/restless  1  Suicidal thoughts 0 0  PHQ-9 Score 19 5  Difficult doing work/chores Extremely dIfficult Somewhat difficult   4-medication -pt forgot to speak with her PCP about a medication increase when she met earlier this week -pt plans to send him a message in [place] requesting a medication increase to try and combat the increased depression and anxiety  Treatment Plan Problems Addressed-Anxiety, Depression, Grief / Loss Unresolved, Vocational Stress  Goals 1. Develop an awareness of how the avoidance of grieving has affected life and begin the healing process. 2. Enhance ability to effectively cope with the full variety of life's  worries and anxieties. Objective Complete a Cost Benefit Analysis of maintaining the anxiety. Target Date: 2022-02-15 Frequency: Biweekly  Progress: 0 Modality: individual  Related Interventions Ask the client to evaluate the costs and benefits of worries (e.g., complete the Cost Benefit Analysis exercise in Ten Days to Self-Esteem! by Quay Burow) in which he/she lists the advantages and disadvantages of the negative thought, fear, or anxiety; process the completed assignment. Objective Describe situations, thoughts, feelings, and actions associated with anxieties and worries, their impact on functioning, and attempts to resolve them. Target Date: 2022-02-15 Frequency: Biweekly  Progress: 0 Modality: individual  Related Interventions Focus on developing a level of trust with the client; provide support and empathy to encourage the client to feel safe in expressing his/her GAD symptoms. Ask the client to describe his/her past experiences of anxiety and their impact on functioning; assess the focus, excessiveness, and uncontrollability of the worry and the type, frequency, intensity, and duration of his/her anxiety symptoms (consider using a structured interview such as The Anxiety Disorders Interview Schedule-Adult Version). Objective Identify the major life conflicts from the past and present that form the basis for present anxiety. Target Date: 2022-02-15 Frequency: Biweekly  Progress: 0 Modality: individual  Related Interventions Ask the client to develop and process a list of key past and present life conflicts that continue to cause worry. Reinforce the client's insights into the role of his/her past emotional pain and present anxiety. Assist the client in becoming aware of key unresolved life conflicts and in starting to work toward their resolution. 3. Identify and increase intrapersonal, interpersonal, and physical resources to foster positive coping strategies. Objective Articulate  self-care methods to prevent and/or manage depression in the future; create a list of 10 coping methods for future use. Target Date: 2022-02-15 Frequency: Biweekly  Progress: 60 Modality: individual  Related Interventions Teach the client conflict resolution skills (e.g., empathy, active listening, "I messages," respectful communication, assertiveness without aggression, compromise) to help alleviate depression; use modeling, role-playing, and behavior rehearsal to work through several current conflicts. Explore the role of interpersonal conflict in maintaining the client's depression; clarify the sources and nature of these conflicts. Assign the client to write letters to individuals who may be contributing to her distress and depression, asking that she outline what she wants in the letters; process the letters, generating solutions within an interpersonal context. Help the client resolve depression related to interpersonal problems through the use of reassurance and support, clarification of cognitive and affective triggers that ignite conflicts, and active problem-solving (or assign "Applying Problem-Solving to Interpersonal Conflict" in the Adult Psychotherapy Homework Planner, 2nd ed. by Bryn Gulling). Objective Increase social contacts and communicate needs within existing interpersonal relationships. Target Date: 2022-02-15 Frequency: Biweekly  Progress: 80 Modality: individual  Objective Identify and replace cognitive self-talk that  supports depression. Target Date: 2022-02-15 Frequency: Biweekly  Progress: 60 Modality: individual  Related Interventions Encourage the client to discuss cognitive distortions, including automatic thoughts (e.g., negative view of self, future, experience) and negative schemas (e.g., core beliefs about self and others based on earlier childhood experiences); assess frequency of negative self-statements associated with depression. Objective Increase the frequency of  engaging in pleasant activities. Target Date: 2022-02-15 Frequency: Biweekly  Progress: 50 Modality: individual  Related Interventions Monitor and encourage the client to attend to personal grooming and healthy sleeping and eating patterns; reinforce improvement. Periodically assess and monitor the client for suicide potential; intervene as necessary. Objective Implement assertiveness to communicate needs and desires to others. Target Date: 2022-02-15 Frequency: Biweekly  Progress: 70 Modality: individual  Objective Implement conflict resolution skills to alleviate interpersonal sources of depressed mood. Target Date: 2022-02-15 Frequency: Biweekly  Progress: 50 Modality: individual  4. Improve satisfaction and comfort surrounding coworker relationships. 5. Increase sense of confidence and competence in dealing with work responsibilities. Objective Develop and verbalize a plan for constructive action to reduce vocational stress. Target Date: 2022-02-15 Frequency: Biweekly  Progress: 0 Modality: individual  Related Interventions Assist the client in developing a plan to react positively to his/her vocational situation (or assign "My Vocational Action Plan" in the Adult Psychotherapy Homework Planner by [facility]); process the proactive plan and assist in its implementation. Objective Review family-of-origin history to determine roots for interpersonal conflict. Target Date: 2022-02-15 Frequency: Biweekly  Progress: 0 Modality: individual  Related Interventions Probe the client's family-of-origin history for causes of current interpersonal conflict patterns that are being reenacted in the work setting. Objective Replace projection of responsibility for the conflict with acceptance of responsibility for own role in conflict. Target Date: 2022-02-15 Frequency: Biweekly  Progress: 0 Modality: individual  Related Interventions Confront the client's projection of responsibility for his/her  behavior and feelings onto others; emphasize his/her need to examine his/her own role in the conflict. Reinforce the client's acceptance of responsibility for personal feelings and behavior as they contribute to the conflict in the work setting. Objective Identify own role in the conflict with coworkers or Librarian, academic. Target Date: 2022-02-15 Frequency: Biweekly  Progress: 0 Modality: individual  Related Interventions Clarify the nature of the client's conflicts in the work setting. Help the client identify his/her own role in the conflict, attempting to represent the other party's point of view. Objective Learn and implement problem-solving skills. Target Date: 2022-02-15 Frequency: Biweekly  Progress: 0 Modality: individual  Related Interventions Conduct Problem-Solving Therapy (see Problem-Solving Therapy by Shawnee Knapp and Delane Ginger) using techniques such as psychoeducation, modeling, and role-playing to teach the client problem-solving skills (i.e., defining a problem specifically, generating possible solutions, evaluating the pros and cons of each solution, selecting and implementing a plan of action, evaluating the efficacy of the plan, accepting or revising the plan); role-play application of the problem-solving skill to a real life issue (or assign "Applying Problem-Solving to Interpersonal Conflict" in the Adult Psychotherapy Homework Planner by Bryn Gulling). Objective Identify patterns of similar conflict with people outside the work environment. Target Date: 2022-02-15 Frequency: Biweekly  Progress: 0 Modality: individual  Related Interventions Explore the client's patterns of interpersonal conflict that occur beyond the work setting but are repeated in the work setting. 6. Resolve the core conflict that is the source of anxiety. 7. Resolve the loss, reengaging in old relationships and initiating new contacts with others. Objective Express thoughts and feelings about the deceased that went  unexpressed while the deceased was alive. Target Date:  2022-02-15 Frequency: Biweekly  Progress: 0 Modality: individual  Related Interventions Conduct an empty-chair exercise with the client where he/she focuses on expressing to the lost loved one imagined in the chair what he/she never said while that loved one was alive. Ask the client to write a letter to the lost person describing his/her fond memories and/or painful and regretful memories, and how he/she currently feels life (or assign "Dear : A Letter to a Lost Loved One" in the Adult Psychotherapy Homework Planner by [facility]); process the letter in session. Assign the client to visit the grave of the lost loved one to "talk to" the deceased and express his/her feelings. Objective Identify how avoiding dealing with loss has negatively impacted life. Target Date: 2022-02-15 Frequency: Biweekly  Progress: 0 Modality: individual  Related Interventions Ask the client to list ways that avoidance of grieving has negatively impacted his/her life. 8. Understand personal desires, insecurities, and anxieties that make overspending possible.  Diagnosis:Major depressive disorder, recurrent episode, moderate (HCC)  Generalized anxiety disorder  Plan:  -given pt's decline in mood, meet again in one week -get out of the house today and tomorrow -meet again on Saturday, October 02, 2021 at [place], [facility]

## 2021-10-02 ENCOUNTER — Ambulatory Visit (INDEPENDENT_AMBULATORY_CARE_PROVIDER_SITE_OTHER): Payer: BC Managed Care – PPO | Admitting: Professional

## 2021-10-02 ENCOUNTER — Encounter: Payer: Self-pay | Admitting: Professional

## 2021-10-02 DIAGNOSIS — F331 Major depressive disorder, recurrent, moderate: Secondary | ICD-10-CM | POA: Diagnosis not present

## 2021-10-02 DIAGNOSIS — F411 Generalized anxiety disorder: Secondary | ICD-10-CM | POA: Diagnosis not present

## 2021-10-02 DIAGNOSIS — F988 Other specified behavioral and emotional disorders with onset usually occurring in childhood and adolescence: Secondary | ICD-10-CM | POA: Diagnosis not present

## 2021-10-02 DIAGNOSIS — F4001 Agoraphobia with panic disorder: Secondary | ICD-10-CM | POA: Diagnosis not present

## 2021-10-02 NOTE — Progress Notes (Signed)
[facility] Counselor/Therapist Progress Note  Patient ID: Maureen [name], MRN: [id number],    Date: 10/02/2021  Time Spent: 42 minutes 1002-1044am  Treatment Type: Individual Therapy  Risk Assessment: Danger to Self:  No Self-injurious Behavior: No Danger to Others: No  Subjective: This session was held via video teletherapy. The patient consented to video teletherapy and was located at her home during this session. She is aware it is the responsibility of the patient to secure confidentiality on her end of the session. The provider was in a private home office for the duration of this session.   The patient arrived on time for her webex appointment.  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 and will not have to test or extra responsibilities -pt notices that she is experiencing nausea every day -pt needs to eat healthy and take her vitamins and medicine -pt going to work in garden -pt bought herself flowers     10/02/2021   10:09 AM 09/25/2021   10:28 AM  Depression screen PHQ 2/9  Decreased Interest 1 3  Down, Depressed, Hopeless 1 2  PHQ - 2 Score 2 5  Altered sleeping 1 3  Tired, decreased energy 3 3  Change in appetite 3 3  Feeling bad  or failure about yourself  3 2  Trouble concentrating 2 3  Moving slowly or fidgety/restless 3   Suicidal thoughts 0 0  PHQ-9 Score 17 19  Difficult doing work/chores Very difficult Extremely dIfficult    4-moving forward -she finishes on Sept 25th and start new job on Oct 2nd -taking a few days off to try and decompress   -pt plans to go to park and walk   -pt thinks she might ride to [place]    -sleeping in   -consider getting a massage -pt planning to visit [place]   -she will October 25th   -she has already been in touch with a realtor   -she is going to put her house on the market   -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 Develop an awareness of how the avoidance of grieving has affected life and begin the healing process. 2. Enhance ability to effectively cope with the full variety of life's worries and anxieties. Objective Complete a Cost Benefit Analysis of maintaining the anxiety. Target Date: 2022-02-15 Frequency: Biweekly  Progress: 0 Modality: individual  Related Interventions Ask the client to evaluate the costs and benefits of worries (e.g., complete the Cost Benefit Analysis exercise in Ten Days to Self-Esteem! by Quay Burow) in which he/she lists the advantages and disadvantages of the negative thought, fear, or anxiety; process the completed assignment. Objective Describe situations, thoughts, feelings, and actions associated with anxieties and worries, their  impact on functioning, and attempts to resolve them. Target Date: 2022-02-15 Frequency: Biweekly  Progress: 0 Modality: individual  Related Interventions Focus on developing a level of trust with the client; provide support and empathy to encourage the client to feel safe in expressing his/her GAD symptoms. Ask the client to describe his/her past experiences of anxiety and their impact on functioning; assess the focus, excessiveness, and uncontrollability of the worry and the type, frequency, intensity, and duration of his/her anxiety symptoms (consider using a structured interview such as The Anxiety Disorders Interview Schedule-Adult Version). Objective Identify the major life conflicts from the past and present that form the basis for present anxiety. Target Date: 2022-02-15 Frequency: Biweekly  Progress: 0 Modality: individual  Related Interventions Ask the client to develop and process a list of key past and present life conflicts that continue to cause worry. Reinforce the client's insights into the role of his/her past emotional pain and present anxiety. Assist the client in becoming aware of key unresolved life conflicts and in starting to work toward their resolution. 3. Identify and increase intrapersonal, interpersonal, and physical resources to foster positive coping strategies. Objective Articulate self-care methods to prevent and/or manage depression in the future; create a list of 10 coping methods for future use. Target Date: 2022-02-15 Frequency: Biweekly  Progress: 60 Modality: individual  Related Interventions Teach the client conflict resolution skills (e.g., empathy, active listening, "I messages," respectful communication, assertiveness without aggression, compromise) to help alleviate depression; use modeling, role-playing, and behavior rehearsal to work through several current conflicts. Explore the role of interpersonal conflict in maintaining the client's depression;  clarify the sources and nature of these conflicts. Assign the client to write letters to individuals who may be contributing to her distress and depression, asking that she outline what she wants in the letters; process the letters, generating solutions within an interpersonal context. Help the client resolve depression related to interpersonal problems through the use of reassurance and support, clarification of cognitive and affective triggers that ignite conflicts, and active problem-solving (or assign "Applying Problem-Solving to Interpersonal Conflict" in the Adult Psychotherapy Homework Planner, 2nd ed. by Bryn Gulling). Objective Increase social contacts and communicate needs within existing interpersonal relationships. Target Date: 2022-02-15 Frequency: Biweekly  Progress: 80 Modality: individual  Objective Identify and replace cognitive self-talk that supports depression. Target Date: 2022-02-15 Frequency: Biweekly  Progress: 60 Modality: individual  Related Interventions Encourage the client to discuss cognitive distortions, including automatic thoughts (e.g., negative view of self, future, experience) and negative schemas (e.g., core beliefs about self and others based on earlier childhood experiences); assess frequency of negative self-statements associated with depression. Objective Increase the frequency of engaging in pleasant activities. Target Date: 2022-02-15 Frequency: Biweekly  Progress: 50 Modality: individual  Related Interventions Monitor and encourage the client to attend to personal  grooming and healthy sleeping and eating patterns; reinforce improvement. Periodically assess and monitor the client for suicide potential; intervene as necessary. Objective Implement assertiveness to communicate needs and desires to others. Target Date: 2022-02-15 Frequency: Biweekly  Progress: 70 Modality: individual  Objective Implement conflict resolution skills to alleviate interpersonal  sources of depressed mood. Target Date: 2022-02-15 Frequency: Biweekly  Progress: 50 Modality: individual  4. Improve satisfaction and comfort surrounding coworker relationships. 5. Increase sense of confidence and competence in dealing with work responsibilities. Objective Develop and verbalize a plan for constructive action to reduce vocational stress. Target Date: 2022-02-15 Frequency: Biweekly  Progress: 0 Modality: individual  Related Interventions Assist the client in developing a plan to react positively to his/her vocational situation (or assign "My Vocational Action Plan" in the Adult Psychotherapy Homework Planner by [facility]); process the proactive plan and assist in its implementation. Objective Review family-of-origin history to determine roots for interpersonal conflict. Target Date: 2022-02-15 Frequency: Biweekly  Progress: 0 Modality: individual  Related Interventions Probe the client's family-of-origin history for causes of current interpersonal conflict patterns that are being reenacted in the work setting. Objective Replace projection of responsibility for the conflict with acceptance of responsibility for own role in conflict. Target Date: 2022-02-15 Frequency: Biweekly  Progress: 0 Modality: individual  Related Interventions Confront the client's projection of responsibility for his/her behavior and feelings onto others; emphasize his/her need to examine his/her own role in the conflict. Reinforce the client's acceptance of responsibility for personal feelings and behavior as they contribute to the conflict in the work setting. Objective Identify own role in the conflict with coworkers or Librarian, academic. Target Date: 2022-02-15 Frequency: Biweekly  Progress: 0 Modality: individual  Related Interventions Clarify the nature of the client's conflicts in the work setting. Help the client identify his/her own role in the conflict, attempting to represent the other party's  point of view. Objective Learn and implement problem-solving skills. Target Date: 2022-02-15 Frequency: Biweekly  Progress: 0 Modality: individual  Related Interventions Conduct Problem-Solving Therapy (see Problem-Solving Therapy by Shawnee Knapp and Delane Ginger) using techniques such as psychoeducation, modeling, and role-playing to teach the client problem-solving skills (i.e., defining a problem specifically, generating possible solutions, evaluating the pros and cons of each solution, selecting and implementing a plan of action, evaluating the efficacy of the plan, accepting or revising the plan); role-play application of the problem-solving skill to a real life issue (or assign "Applying Problem-Solving to Interpersonal Conflict" in the Adult Psychotherapy Homework Planner by Bryn Gulling). Objective Identify patterns of similar conflict with people outside the work environment. Target Date: 2022-02-15 Frequency: Biweekly  Progress: 0 Modality: individual  Related Interventions Explore the client's patterns of interpersonal conflict that occur beyond the work setting but are repeated in the work setting. 6. Resolve the core conflict that is the source of anxiety. 7. Resolve the loss, reengaging in old relationships and initiating new contacts with others. Objective Express thoughts and feelings about the deceased that went unexpressed while the deceased was alive. Target Date: 2022-02-15 Frequency: Biweekly  Progress: 0 Modality: individual  Related Interventions Conduct an empty-chair exercise with the client where he/she focuses on expressing to the lost loved one imagined in the chair what he/she never said while that loved one was alive. Ask the client to write a letter to the lost person describing his/her fond memories and/or painful and regretful memories, and how he/she currently feels life (or assign "Dear : A Letter to a Lost Loved One" in the Adult Psychotherapy Doctor, general practice by Bryn Gulling);  process the letter in session. Assign the client to visit the grave of the lost loved one to "talk to" the deceased and express his/her feelings. Objective Identify how avoiding dealing with loss has negatively impacted life. Target Date: 2022-02-15 Frequency: Biweekly  Progress: 0 Modality: individual  Related Interventions Ask the client to list ways that avoidance of grieving has negatively impacted his/her life. 8. Understand personal desires, insecurities, and anxieties that make overspending possible.  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

## 2021-10-09 ENCOUNTER — Encounter: Payer: Self-pay | Admitting: Professional

## 2021-10-09 ENCOUNTER — Ambulatory Visit (INDEPENDENT_AMBULATORY_CARE_PROVIDER_SITE_OTHER): Payer: BC Managed Care – PPO | Admitting: Professional

## 2021-10-09 DIAGNOSIS — F988 Other specified behavioral and emotional disorders with onset usually occurring in childhood and adolescence: Secondary | ICD-10-CM | POA: Diagnosis not present

## 2021-10-09 DIAGNOSIS — F331 Major depressive disorder, recurrent, moderate: Secondary | ICD-10-CM | POA: Diagnosis not present

## 2021-10-09 DIAGNOSIS — F411 Generalized anxiety disorder: Secondary | ICD-10-CM

## 2021-10-09 DIAGNOSIS — F4001 Agoraphobia with panic disorder: Secondary | ICD-10-CM

## 2021-10-09 NOTE — Progress Notes (Signed)
[facility] Counselor/Therapist Progress Note  Patient ID: Maureen [name], MRN: [id number],    Date: 10/09/2021  Time Spent: 38 minutes 1001-1039am  Treatment Type: Individual Therapy  Risk Assessment: Danger to Self:  No Self-injurious Behavior: No Danger to Others: No  Subjective: This session was held via video teletherapy. The patient consented to video teletherapy and was located at her home during this session. She is aware it is the responsibility of the patient to secure confidentiality on her end of the session. The provider was in a private home office for the duration of this session.   The patient arrived on time for her webex appointment.  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 Develop an awareness of how the avoidance of grieving has affected life and begin the  healing process. 2. Enhance ability to effectively cope with the full variety of life's worries and anxieties. Objective Complete a Cost Benefit Analysis of maintaining the anxiety. Target Date: 2022-02-15 Frequency: Biweekly  Progress: 0 Modality: individual  Related Interventions Ask the client to evaluate the costs and benefits of worries (e.g., complete the Cost Benefit Analysis exercise in Ten Days to Self-Esteem! by Quay Burow) in which he/she lists the advantages and disadvantages of the negative thought, fear, or anxiety; process the completed assignment. Objective Describe situations, thoughts, feelings, and actions associated with anxieties and worries, their impact on functioning, and attempts to resolve them. Target Date: 2022-02-15 Frequency: Biweekly  Progress: 0 Modality: individual  Related Interventions Focus on developing a level of trust with the client; provide support and empathy to encourage the client to feel safe in expressing his/her GAD symptoms. Ask the client to describe his/her past experiences of anxiety and their impact on functioning; assess the focus, excessiveness, and uncontrollability of the worry and the type, frequency, intensity, and duration of his/her anxiety symptoms (consider using a structured interview such as The Anxiety Disorders Interview Schedule-Adult Version). Objective Identify the major life conflicts from the past and present that form the basis for present anxiety. Target Date: 2022-02-15 Frequency: Biweekly  Progress: 0 Modality: individual  Related Interventions Ask the client to develop and process a list of key past and present life conflicts that continue to cause worry. Reinforce the client's insights into the role of his/her past emotional pain and present anxiety. Assist the client in becoming aware of key unresolved life conflicts and in  starting to work toward their resolution. 3. Identify and increase intrapersonal, interpersonal,  and physical resources to foster positive coping strategies. Objective Articulate self-care methods to prevent and/or manage depression in the future; create a list of 10 coping methods for future use. Target Date: 2022-02-15 Frequency: Biweekly  Progress: 60 Modality: individual  Related Interventions Teach the client conflict resolution skills (e.g., empathy, active listening, "I messages," respectful communication, assertiveness without aggression, compromise) to help alleviate depression; use modeling, role-playing, and behavior rehearsal to work through several current conflicts. Explore the role of interpersonal conflict in maintaining the client's depression; clarify the sources and nature of these conflicts. Assign the client to write letters to individuals who may be contributing to her distress and depression, asking that she outline what she wants in the letters; process the letters, generating solutions within an interpersonal context. Help the client resolve depression related to interpersonal problems through the use of reassurance and support, clarification of cognitive and affective triggers that ignite conflicts, and active problem-solving (or assign "Applying Problem-Solving to Interpersonal Conflict" in the Adult Psychotherapy Homework Planner, 2nd ed. by Bryn Gulling). Objective Increase social contacts and communicate needs within existing interpersonal relationships. Target Date: 2022-02-15 Frequency: Biweekly  Progress: 80 Modality: individual  Objective Identify and replace cognitive self-talk that supports depression. Target Date: 2022-02-15 Frequency: Biweekly  Progress: 60 Modality: individual  Related Interventions Encourage the client to discuss cognitive distortions, including automatic thoughts (e.g., negative view of self, future, experience) and negative schemas (e.g., core beliefs about self and others based on earlier childhood experiences); assess frequency of  negative self-statements associated with depression. Objective Increase the frequency of engaging in pleasant activities. Target Date: 2022-02-15 Frequency: Biweekly  Progress: 50 Modality: individual  Related Interventions Monitor and encourage the client to attend to personal grooming and healthy sleeping and eating patterns; reinforce improvement. Periodically assess and monitor the client for suicide potential; intervene as necessary. Objective Implement assertiveness to communicate needs and desires to others. Target Date: 2022-02-15 Frequency: Biweekly  Progress: 70 Modality: individual  Objective Implement conflict resolution skills to alleviate interpersonal sources of depressed mood. Target Date: 2022-02-15 Frequency: Biweekly  Progress: 50 Modality: individual  4. Improve satisfaction and comfort surrounding coworker relationships. 5. Increase sense of confidence and competence in dealing with work responsibilities. Objective Develop and verbalize a plan for constructive action to reduce vocational stress. Target Date: 2022-02-15 Frequency: Biweekly  Progress: 0 Modality: individual  Related Interventions Assist the client in developing a plan to react positively to his/her vocational situation (or assign "My Vocational Action Plan" in the Adult Psychotherapy Homework Planner by [facility]); process the proactive plan and assist in its implementation. Objective Review family-of-origin history to determine roots for interpersonal conflict. Target Date: 2022-02-15 Frequency: Biweekly  Progress: 0 Modality: individual  Related Interventions Probe the client's family-of-origin history for causes of current interpersonal conflict patterns that are being reenacted in the work setting. Objective Replace projection of responsibility for the conflict with acceptance of responsibility for own role in conflict. Target Date: 2022-02-15 Frequency: Biweekly  Progress: 0 Modality: individual   Related Interventions Confront the client's projection of responsibility for his/her behavior and feelings onto others; emphasize his/her need to examine his/her own role in the conflict. Reinforce the client's acceptance of responsibility for personal feelings and behavior as they contribute to the conflict in the work setting. Objective Identify own role in the conflict with coworkers or Librarian, academic. Target Date: 2022-02-15 Frequency: Biweekly  Progress: 0 Modality: individual  Related Interventions Clarify the nature  of the client's conflicts in the work setting. Help the client identify his/her own role in the conflict, attempting to represent the other party's point of view. Objective Learn and implement problem-solving skills. Target Date: 2022-02-15 Frequency: Biweekly  Progress: 0 Modality: individual  Related Interventions Conduct Problem-Solving Therapy (see Problem-Solving Therapy by Shawnee Knapp and Delane Ginger) using techniques such as psychoeducation, modeling, and role-playing to teach the client problem-solving skills (i.e., defining a problem specifically, generating possible solutions, evaluating the pros and cons of each solution, selecting and implementing a plan of action, evaluating the efficacy of the plan, accepting or revising the plan); role-play application of the problem-solving skill to a real life issue (or assign "Applying Problem-Solving to Interpersonal Conflict" in the Adult Psychotherapy Homework Planner by Bryn Gulling). Objective Identify patterns of similar conflict with people outside the work environment. Target Date: 2022-02-15 Frequency: Biweekly  Progress: 0 Modality: individual  Related Interventions Explore the client's patterns of interpersonal conflict that occur beyond the work setting but are repeated in the work setting. 6. Resolve the core conflict that is the source of anxiety. 7. Resolve the loss, reengaging in old relationships and initiating new  contacts with others. Objective Express thoughts and feelings about the deceased that went unexpressed while the deceased was alive. Target Date: 2022-02-15 Frequency: Biweekly  Progress: 0 Modality: individual  Related Interventions Conduct an empty-chair exercise with the client where he/she focuses on expressing to the lost loved one imagined in the chair what he/she never said while that loved one was alive. Ask the client to write a letter to the lost person describing his/her fond memories and/or painful and regretful memories, and how he/she currently feels life (or assign "Dear : A Letter to a Lost Loved One" in the Adult Psychotherapy Homework Planner by [facility]); process the letter in session. Assign the client to visit the grave of the lost loved one to "talk to" the deceased and express his/her feelings. Objective Identify how avoiding dealing with loss has negatively impacted life. Target Date: 2022-02-15 Frequency: Biweekly  Progress: 0 Modality: individual  Related Interventions Ask the client to list ways that avoidance of grieving has negatively impacted his/her life. 8. Understand personal desires, insecurities, and anxieties that make overspending possible.  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

## 2021-10-26 ENCOUNTER — Ambulatory Visit: Payer: Self-pay | Admitting: Professional

## 2021-11-06 ENCOUNTER — Ambulatory Visit (INDEPENDENT_AMBULATORY_CARE_PROVIDER_SITE_OTHER): Payer: Self-pay | Admitting: Professional

## 2021-11-06 ENCOUNTER — Encounter: Payer: Self-pay | Admitting: Professional

## 2021-11-06 DIAGNOSIS — F331 Major depressive disorder, recurrent, moderate: Secondary | ICD-10-CM

## 2021-11-06 DIAGNOSIS — F411 Generalized anxiety disorder: Secondary | ICD-10-CM

## 2021-11-06 DIAGNOSIS — F988 Other specified behavioral and emotional disorders with onset usually occurring in childhood and adolescence: Secondary | ICD-10-CM

## 2021-11-06 NOTE — Progress Notes (Signed)
[facility] Counselor/Therapist Progress Note  Patient ID: Maureen [name], MRN: [id number],    Date: 11/06/2021  Time Spent: 47 minutes 11-1147am  Treatment Type: Individual Therapy  Risk Assessment: Danger to Self:  No Self-injurious Behavior: No Danger to Others: No  Subjective: This session was held via video teletherapy. The patient consented to video teletherapy and was located at her home during this session. She is aware it is the responsibility of the patient to secure confidentiality on her end of the session. The provider was in a private home office for the duration of this session.   The patient arrived on time for her webex appointment.  Issues addressed: 1- professional a-it was the best decision I have ever made -it has been emotionally and physically hard -it was necessary b-this week was her third week -she was anxious and questioning the entire way to her new job -she was grieving the loss of the relationships   -felt irritated that one person resulted in her need to leave c-last week was her toughest week -she talked to God -the other school send her a picture of all of them holding her ID saying we all miss you -pt admits that there are trauma bond -pt thinks about the seasons and activities that are happening   -the kids and the activities are what makes her miss the environment d-with her new job she doesn't even bring her computer home -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 -she is working for her former [employer]   -it was an easy transition -pt identifies with the girls in the program 2-personal a-notices she feels overwhelmed easily -pt notices she is somewhat withdrawn because she is not ready to build relationship at her new employer   -pt stated she is not ready b-pt admits she is feeling guarded at this time c-talked to HS friend Maureen [name] -they  are going to see each other next weekend it he is back -he has been doing some work with his mother -he is already retired from working in [employer] facilities -his dad passed away four days before her mom   -he lives in [place] and has a house in [place]   -he is now in [place] caring for his mother     -his mom lives an hour from her mom   Treatment Plan Problems Addressed-Anxiety, Depression, Grief / Loss Unresolved, Vocational Stress  Goals 1. Develop an awareness of how the avoidance of grieving has affected life and begin the healing process. 2. Enhance ability to effectively cope with the full variety of life's worries and anxieties. Objective Complete a Cost Benefit Analysis of maintaining the anxiety. Target Date: 2022-02-15 Frequency: Biweekly  Progress: 0 Modality: individual  Related Interventions Ask the client to evaluate the costs and benefits of worries (e.g., complete the Cost Benefit Analysis exercise in Ten Days to Self-Esteem! by Lawerance Bach) in which he/she lists the advantages and disadvantages of the negative thought, fear, or anxiety; process the completed assignment. Objective Describe situations, thoughts, feelings, and actions associated with anxieties and worries, their impact on functioning, and attempts to resolve them. Target Date: 2022-02-15 Frequency: Biweekly  Progress: 0 Modality: individual  Related Interventions Focus on developing a level of trust with the client; provide support and empathy to encourage the client to feel safe in expressing his/her GAD symptoms. Ask the client to describe his/her past experiences  of anxiety and their impact on functioning; assess the focus, excessiveness, and uncontrollability of the worry and the type, frequency, intensity, and duration of his/her anxiety symptoms (consider using a structured interview such as The Anxiety Disorders Interview Schedule-Adult Version). Objective Identify the major life conflicts from  the past and present that form the basis for present anxiety. Target Date: 2022-02-15 Frequency: Biweekly  Progress: 0 Modality: individual  Related Interventions Ask the client to develop and process a list of key past and present life conflicts that continue to cause worry. Reinforce the client's insights into the role of his/her past emotional pain and present anxiety. Assist the client in becoming aware of key unresolved life conflicts and in starting to work toward their resolution. 3. Identify and increase intrapersonal, interpersonal, and physical resources to foster positive coping strategies. Objective Articulate self-care methods to prevent and/or manage depression in the future; create a list of 10 coping methods for future use. Target Date: 2022-02-15 Frequency: Biweekly  Progress: 60 Modality: individual  Related Interventions Teach the client conflict resolution skills (e.g., empathy, active listening, "I messages," respectful communication, assertiveness without aggression, compromise) to help alleviate depression; use modeling, role-playing, and behavior rehearsal to work through several current conflicts. Explore the role of interpersonal conflict in maintaining the client's depression; clarify the sources and nature of these conflicts. Assign the client to write letters to individuals who may be contributing to her distress and depression, asking that she outline what she wants in the letters; process the letters, generating solutions within an interpersonal context. Help the client resolve depression related to interpersonal problems through the use of reassurance and support, clarification of cognitive and affective triggers that ignite conflicts, and active problem-solving (or assign "Applying Problem-Solving to Interpersonal Conflict" in the Adult Psychotherapy Homework Planner, 2nd ed. by Bryn Gulling). Objective Increase social contacts and communicate needs within existing  interpersonal relationships. Target Date: 2022-02-15 Frequency: Biweekly  Progress: 80 Modality: individual  Objective Identify and replace cognitive self-talk that supports depression. Target Date: 2022-02-15 Frequency: Biweekly  Progress: 60 Modality: individual  Related Interventions Encourage the client to discuss cognitive distortions, including automatic thoughts (e.g., negative view of self, future, experience) and negative schemas (e.g., core beliefs about self and others based on earlier childhood experiences); assess frequency of negative self-statements associated with depression. Objective Increase the frequency of engaging in pleasant activities. Target Date: 2022-02-15 Frequency: Biweekly  Progress: 50 Modality: individual  Related Interventions Monitor and encourage the client to attend to personal grooming and healthy sleeping and eating patterns; reinforce improvement. Periodically assess and monitor the client for suicide potential; intervene as necessary. Objective Implement assertiveness to communicate needs and desires to others. Target Date: 2022-02-15 Frequency: Biweekly  Progress: 70 Modality: individual  Objective Implement conflict resolution skills to alleviate interpersonal sources of depressed mood. Target Date: 2022-02-15 Frequency: Biweekly  Progress: 50 Modality: individual  4. Improve satisfaction and comfort surrounding coworker relationships. 5. Increase sense of confidence and competence in dealing with work responsibilities. Objective Develop and verbalize a plan for constructive action to reduce vocational stress. Target Date: 2022-02-15 Frequency: Biweekly  Progress: 0 Modality: individual  Related Interventions Assist the client in developing a plan to react positively to his/her vocational situation (or assign "My Vocational Action Plan" in the Adult Psychotherapy Homework Planner by [facility]); process the proactive plan and assist in its  implementation. Objective Review family-of-origin history to determine roots for interpersonal conflict. Target Date: 2022-02-15 Frequency: Biweekly  Progress: 0 Modality: individual  Related Interventions Probe the  client's family-of-origin history for causes of current interpersonal conflict patterns that are being reenacted in the work setting. Objective Replace projection of responsibility for the conflict with acceptance of responsibility for own role in conflict. Target Date: 2022-02-15 Frequency: Biweekly  Progress: 0 Modality: individual  Related Interventions Confront the client's projection of responsibility for his/her behavior and feelings onto others; emphasize his/her need to examine his/her own role in the conflict. Reinforce the client's acceptance of responsibility for personal feelings and behavior as they contribute to the conflict in the work setting. 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 Target Date: 2022-02-15 Frequency: Biweekly  Progress: 0 Modality: individual  Related Interventions Clarify the nature of the client's conflicts in the work setting. Help the client identify his/her own role in the conflict, attempting to represent the other party's point of view. Objective Learn and implement problem-solving skills. Target Date: 2022-02-15 Frequency: Biweekly  Progress: 0 Modality: individual  Related Interventions Conduct Problem-Solving Therapy (see Problem-Solving Therapy by Domenick Bookbinder and Rob Hickman) using techniques such as psychoeducation, modeling, and role-playing to teach the client problem-solving skills (i.e., defining a problem specifically, generating possible solutions, evaluating the pros and cons of each solution, selecting and implementing a plan of action, evaluating the efficacy of the plan, accepting or revising the plan); role-play application of the problem-solving skill to a real life issue (or assign "Applying  Problem-Solving to Interpersonal Conflict" in the Adult Psychotherapy Homework Planner by Stephannie Li). Objective Identify patterns of similar conflict with people outside the work environment. Target Date: 2022-02-15 Frequency: Biweekly  Progress: 0 Modality: individual  Related Interventions Explore the client's patterns of interpersonal conflict that occur beyond the work setting but are repeated in the work setting. 6. Resolve the core conflict that is the source of anxiety. 7. Resolve the loss, reengaging in old relationships and initiating new contacts with others. Objective Express thoughts and feelings about the deceased that went unexpressed while the deceased was alive. Target Date: 2022-02-15 Frequency: Biweekly  Progress: 0 Modality: individual  Related Interventions Conduct an empty-chair exercise with the client where he/she focuses on expressing to the lost loved one imagined in the chair what he/she never said while that loved one was alive. Ask the client to write a letter to the lost person describing his/her fond memories and/or painful and regretful memories, and how he/she currently feels life (or assign "Dear : A Letter to a Lost Loved One" in the Adult Psychotherapy Homework Planner by [facility]); process the letter in session. Assign the client to visit the grave of the lost loved one to "talk to" the deceased and express his/her feelings. Objective Identify how avoiding dealing with loss has negatively impacted life. Target Date: 2022-02-15 Frequency: Biweekly  Progress: 0 Modality: individual  Related Interventions Ask the client to list ways that avoidance of grieving has negatively impacted his/her life. 8. Understand personal desires, insecurities, and anxieties that make overspending possible.  Diagnosis:Major depressive disorder, recurrent episode, moderate (HCC)  Generalized anxiety disorder  Adult attention deficit disorder  Plan:  -meet again on Monday,  November 22, 2021 at 8am

## 2021-11-22 ENCOUNTER — Encounter: Payer: Self-pay | Admitting: Professional

## 2021-11-22 ENCOUNTER — Ambulatory Visit (INDEPENDENT_AMBULATORY_CARE_PROVIDER_SITE_OTHER): Payer: Self-pay | Admitting: Professional

## 2021-11-22 DIAGNOSIS — F988 Other specified behavioral and emotional disorders with onset usually occurring in childhood and adolescence: Secondary | ICD-10-CM

## 2021-11-22 DIAGNOSIS — F411 Generalized anxiety disorder: Secondary | ICD-10-CM

## 2021-11-22 DIAGNOSIS — F331 Major depressive disorder, recurrent, moderate: Secondary | ICD-10-CM

## 2021-11-22 NOTE — Progress Notes (Addendum)
[name] Counselor/Therapist Progress Note  Patient ID: Maureen [name], MRN: [id number],    Date: 11/22/2021  Time Spent: 44 minutes [id number]  Treatment Type: Individual Therapy  Risk Assessment: Danger to Self:  No Self-injurious Behavior: No Danger to Others: No  Subjective: This session was held via audio teletherapy. The patient consented to audio teletherapy and was located in her car during this session. She is aware it is the responsibility of the patient to secure confidentiality on her end of the session. The provider was in a private home office for the duration of this session.   The patient arrived on time for her audio appointment.  Issues addressed: 1- mood-much improved    11/22/2021    8:07 AM 10/02/2021   10:09 AM  Depression screen PHQ 2/9  Decreased Interest 0 1  Down, Depressed, Hopeless 0 1  PHQ - 2 Score 0 2  Altered sleeping 0 1  Tired, decreased energy 0 3  Change in appetite 0 3  Feeling bad or failure about yourself  0 3  Trouble concentrating 0 2  Moving slowly or fidgety/restless 0 3  Suicidal thoughts 0 0  PHQ-9 Score 0 17  Difficult doing work/chores Not difficult at all Very difficult     2-personal -pt has been preoccupied with dying as related to the lack of living she has done -discussed how she could begin looking at her time as an opportunity instead of a burden -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 and has never had time to establish friendships -she admits that she is ready to talk with Maureen [name] about the potential for a dating relationship   -if he is not interested she would keep him as a friend   -patient ready to be in committed relationship and will begin looking if Maureen [name] is not interested -discussed opportunities to establish friendships and hobbies she would enjoy 3-professional -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 Develop an awareness of how the avoidance of grieving has affected life and begin the healing process. 2. Enhance ability to effectively cope with the full variety of life's worries and anxieties. Objective Complete a Cost Benefit Analysis of maintaining the anxiety. Target Date: 2022-02-15 Frequency: Biweekly  Progress: 0 Modality: individual  Related Interventions Ask the client to evaluate the costs and benefits of worries (e.g., complete the Cost Benefit Analysis exercise in Ten Days to Self-Esteem! by Maureen [name]) in which he/she lists the advantages and disadvantages of the negative thought, fear, or anxiety; process the completed assignment. Objective Describe situations, thoughts, feelings, and actions associated with anxieties and worries, their impact on functioning, and attempts to resolve them. Target Date: 2022-02-15 Frequency: Biweekly  Progress: 0 Modality: individual  Related Interventions Focus on developing a level of trust with the client; provide support and empathy to encourage the client to feel safe in expressing his/her GAD symptoms. Ask the client to describe his/her past experiences of anxiety and their impact on functioning; assess the focus, excessiveness, and uncontrollability of the worry and the type, frequency, intensity, and duration  of his/her anxiety symptoms (consider using a structured interview such as The Anxiety Disorders Interview Schedule-Adult Version). Objective Identify the major life conflicts from the past and present that form the basis for present anxiety. Target Date: 2022-02-15  Frequency: Biweekly  Progress: 0 Modality: individual  Related Interventions Ask the client to develop and process a list of key past and present life conflicts that continue to cause worry. Reinforce the client's insights into the role of his/her past emotional pain and present anxiety. Assist the client in becoming aware of key unresolved life conflicts and in starting to work toward their resolution. 3. Identify and increase intrapersonal, interpersonal, and physical resources to foster positive coping strategies. Objective Articulate self-care methods to prevent and/or manage depression in the future; create a list of 10 coping methods for future use. Target Date: 2022-02-15 Frequency: Biweekly  Progress: 60 Modality: individual  Related Interventions Teach the client conflict resolution skills (e.g., empathy, active listening, "I messages," respectful communication, assertiveness without aggression, compromise) to help alleviate depression; use modeling, role-playing, and behavior rehearsal to work through several current conflicts. Explore the role of interpersonal conflict in maintaining the client's depression; clarify the sources and nature of these conflicts. Assign the client to write letters to individuals who may be contributing to her distress and depression, asking that she outline what she wants in the letters; process the letters, generating solutions within an interpersonal context. Help the client resolve depression related to interpersonal problems through the use of reassurance and support, clarification of cognitive and affective triggers that ignite conflicts, and active problem-solving (or assign "Applying Problem-Solving to Interpersonal Conflict" in the Adult Psychotherapy Homework Planner, 2nd ed. by Maureen [name]). Objective Increase social contacts and communicate needs within existing interpersonal relationships. Target Date: 2022-02-15 Frequency: Biweekly  Progress: 80  Modality: individual  Objective Identify and replace cognitive self-talk that supports depression. Target Date: 2022-02-15 Frequency: Biweekly  Progress: 60 Modality: individual  Related Interventions Encourage the client to discuss cognitive distortions, including automatic thoughts (e.g., negative view of self, future, experience) and negative schemas (e.g., core beliefs about self and others based on earlier childhood experiences); assess frequency of negative self-statements associated with depression. Objective Increase the frequency of engaging in pleasant activities. Target Date: 2022-02-15 Frequency: Biweekly  Progress: 50 Modality: individual  Related Interventions Monitor and encourage the client to attend to personal grooming and healthy sleeping and eating patterns; reinforce improvement. Periodically assess and monitor the client for suicide potential; intervene as necessary. Objective Implement assertiveness to communicate needs and desires to others. Target Date: 2022-02-15 Frequency: Biweekly  Progress: 70 Modality: individual  Objective Implement conflict resolution skills to alleviate interpersonal sources of depressed mood. Target Date: 2022-02-15 Frequency: Biweekly  Progress: 50 Modality: individual  4. Improve satisfaction and comfort surrounding coworker relationships. 5. Increase sense of confidence and competence in dealing with work responsibilities. Objective Develop and verbalize a plan for constructive action to reduce vocational stress. Target Date: 2022-02-15 Frequency: Biweekly  Progress: 0 Modality: individual  Related Interventions Assist the client in developing a plan to react positively to his/her vocational situation (or assign "My Vocational Action Plan" in the Adult Psychotherapy Homework Planner by [facility]); process the proactive plan and assist in its implementation. Objective Review family-of-origin history to determine roots for interpersonal  conflict. Target Date: 2022-02-15 Frequency: Biweekly  Progress: 0 Modality: individual  Related Interventions Probe the client's family-of-origin history for causes of current interpersonal conflict patterns that are being reenacted in the work setting. Objective Replace projection of responsibility  for the conflict with acceptance of responsibility for own role in conflict. Target Date: 2022-02-15 Frequency: Biweekly  Progress: 0 Modality: individual  Related Interventions Confront the client's projection of responsibility for his/her behavior and feelings onto others; emphasize his/her need to examine his/her own role in the conflict. Reinforce the client's acceptance of responsibility for personal feelings and behavior as they contribute to the conflict in the work setting. 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 Target Date: 2022-02-15 Frequency: Biweekly  Progress: 0 Modality: individual  Related Interventions Clarify the nature of the client's conflicts in the work setting. Help the client identify his/her own role in the conflict, attempting to represent the other party's point of view. Objective Learn and implement problem-solving skills. Target Date: 2022-02-15 Frequency: Biweekly  Progress: 0 Modality: individual  Related Interventions Conduct Problem-Solving Therapy (see Problem-Solving Therapy by Domenick Bookbinder and Rob Hickman) using techniques such as psychoeducation, modeling, and role-playing to teach the client problem-solving skills (i.e., defining a problem specifically, generating possible solutions, evaluating the pros and cons of each solution, selecting and implementing a plan of action, evaluating the efficacy of the plan, accepting or revising the plan); role-play application of the problem-solving skill to a real life issue (or assign "Applying Problem-Solving to Interpersonal Conflict" in the Adult Psychotherapy Homework Planner by  Stephannie Li). Objective Identify patterns of similar conflict with people outside the work environment. Target Date: 2022-02-15 Frequency: Biweekly  Progress: 0 Modality: individual  Related Interventions Explore the client's patterns of interpersonal conflict that occur beyond the work setting but are repeated in the work setting. 6. Resolve the core conflict that is the source of anxiety. 7. Resolve the loss, reengaging in old relationships and initiating new contacts with others. Objective Express thoughts and feelings about the deceased that went unexpressed while the deceased was alive. Target Date: 2022-02-15 Frequency: Biweekly  Progress: 0 Modality: individual  Related Interventions Conduct an empty-chair exercise with the client where he/she focuses on expressing to the lost loved one imagined in the chair what he/she never said while that loved one was alive. Ask the client to write a letter to the lost person describing his/her fond memories and/or painful and regretful memories, and how he/she currently feels life (or assign "Dear : A Letter to a Lost Loved One" in the Adult Psychotherapy Homework Planner by [facility]); process the letter in session. Assign the client to visit the grave of the lost loved one to "talk to" the deceased and express his/her feelings. Objective Identify how avoiding dealing with loss has negatively impacted life. Target Date: 2022-02-15 Frequency: Biweekly  Progress: 0 Modality: individual  Related Interventions Ask the client to list ways that avoidance of grieving has negatively impacted his/her life. 8. Understand personal desires, insecurities, and anxieties that make overspending possible.  Diagnosis:Major depressive disorder, recurrent episode, moderate (HCC)  Generalized anxiety disorder  Adult attention deficit disorder  Plan:  -meet again on Monday, December 13, 2021 at 8am  [place], [facility]

## 2021-12-13 ENCOUNTER — Ambulatory Visit (INDEPENDENT_AMBULATORY_CARE_PROVIDER_SITE_OTHER): Payer: Self-pay | Admitting: Professional

## 2021-12-13 ENCOUNTER — Encounter: Payer: Self-pay | Admitting: Professional

## 2021-12-13 DIAGNOSIS — F988 Other specified behavioral and emotional disorders with onset usually occurring in childhood and adolescence: Secondary | ICD-10-CM

## 2021-12-13 DIAGNOSIS — F331 Major depressive disorder, recurrent, moderate: Secondary | ICD-10-CM

## 2021-12-13 DIAGNOSIS — F411 Generalized anxiety disorder: Secondary | ICD-10-CM

## 2021-12-13 NOTE — Progress Notes (Signed)
[facility] Counselor/Therapist Progress Note  Patient ID: Maureen [name], MRN: [id number],    Date: 12/13/2021  Time Spent: 46 minutes 8-846am  Treatment Type: Individual Therapy  Risk Assessment: Danger to Self:  No Self-injurious Behavior: No Danger to Others: No  Subjective: This session was held via audio teletherapy. The patient consented to audio teletherapy and was located in her car during this session. She is aware it is the responsibility of the patient to secure confidentiality on her end of the session. The provider was in a private home office for the duration of this session.   The patient arrived on time for her audio appointment.  Issues addressed: 1- grieving a-"the what if's" -of not going to see her mother when her mother canceled on the last moment -her mom never wanted to take care of the pt and not the other way around -she was selfish her whole life -pt feels guilty that she died -use of a grief box and consider allowing herself to do so daily b-doesn't feel the same grief with her uncle's death -the relationship she shared with him was a healthy one c-she is trying to learn from her experience -naturally doesn't go toward being in touch with people -she worries about the potential loss of that person -potential loss of children, step-mom and  step-mom's husband Grace Blight and [place]) in [place], her dad (Poppie) and his dad's second wife (Mimi) d-call and check on children more individually -Brayton Caves is back home after living with her boyfriend -her greatest relationship with her daughter Sallyanne Kuster has turned around greatly   -she got married two weeks ago   -pt supported her through the wedding planning/details   -disappointed that her father did not attend her daughter's wedding   -Mimi could not attend due to finances    -has allowed them to bond   -Imani had been hurt by her parent's divorce     -"the ice kind of broke with her"  -Ivin Booty calls  on Sundays e-stays in touch routinely only with Grace Blight and Aurelio Jew -relationship feels natural   -Grace Blight raised her and she did not like her then   -she has developed the closeness with her f-how to develop closeness with her children -letting down her guard is part of her problem -pt worries about potential rejection   -has experienced it with Ivin Booty   -last week he made a remark that triggered the pt     -the way it was said triggered me     -pt admits to having felt powerless   -pt identified the trigger and became angry     -not with Ivin Booty but with the trigger     -how to address with him  Treatment Plan Problems Addressed-Anxiety, Depression, Grief / Loss Unresolved, Vocational Stress  Goals 1. Develop an awareness of how the avoidance of grieving has affected life and begin the healing process. 2. Enhance ability to effectively cope with the full variety of life's worries and anxieties. Objective Complete a Cost Benefit Analysis of maintaining the anxiety. Target Date: 2022-02-15 Frequency: Biweekly  Progress: 0 Modality: individual  Related Interventions Ask the client to evaluate the costs and benefits of worries (e.g., complete the Cost Benefit Analysis exercise in Ten Days to Self-Esteem! by Lawerance Bach) in which he/she lists the advantages and disadvantages of the negative thought, fear, or anxiety; process the completed assignment. Objective Describe situations, thoughts, feelings, and actions associated with anxieties and worries, their impact on functioning, and attempts  to resolve them. Target Date: 2022-02-15 Frequency: Biweekly  Progress: 0 Modality: individual  Related Interventions Focus on developing a level of trust with the client; provide support and empathy to encourage the client to feel safe in expressing his/her GAD symptoms. Ask the client to describe his/her past experiences of anxiety and their impact on functioning; assess the focus, excessiveness, and  uncontrollability of the worry and the type, frequency, intensity, and duration of his/her anxiety symptoms (consider using a structured interview such as The Anxiety Disorders Interview Schedule-Adult Version). Objective Identify the major life conflicts from the past and present that form the basis for present anxiety. Target Date: 2022-02-15 Frequency: Biweekly  Progress: 0 Modality: individual  Related Interventions Ask the client to develop and process a list of key past and present life conflicts that continue to cause worry. Reinforce the client's insights into the role of his/her past emotional pain and present anxiety. Assist the client in becoming aware of key unresolved life conflicts and in starting to work toward their resolution. 3. Identify and increase intrapersonal, interpersonal, and physical resources to foster positive coping strategies. Objective Articulate self-care methods to prevent and/or manage depression in the future; create a list of 10 coping methods for future use. Target Date: 2022-02-15 Frequency: Biweekly  Progress: 60 Modality: individual  Related Interventions Teach the client conflict resolution skills (e.g., empathy, active listening, "I messages," respectful communication, assertiveness without aggression, compromise) to help alleviate depression; use modeling, role-playing, and behavior rehearsal to work through several current conflicts. Explore the role of interpersonal conflict in maintaining the client's depression; clarify the sources and nature of these conflicts. Assign the client to write letters to individuals who may be contributing to her distress and depression, asking that she outline what she wants in the letters; process the letters, generating solutions within an interpersonal context. Help the client resolve depression related to interpersonal problems through the use of reassurance and support, clarification of cognitive and affective  triggers that ignite conflicts, and active problem-solving (or assign "Applying Problem-Solving to Interpersonal Conflict" in the Adult Psychotherapy Homework Planner, 2nd ed. by Stephannie Li). Objective Increase social contacts and communicate needs within existing interpersonal relationships. Target Date: 2022-02-15 Frequency: Biweekly  Progress: 80 Modality: individual  Objective Identify and replace cognitive self-talk that supports depression. Target Date: 2022-02-15 Frequency: Biweekly  Progress: 60 Modality: individual  Related Interventions Encourage the client to discuss cognitive distortions, including automatic thoughts (e.g., negative view of self, future, experience) and negative schemas (e.g., core beliefs about self and others based on earlier childhood experiences); assess frequency of negative self-statements associated with depression. Objective Increase the frequency of engaging in pleasant activities. Target Date: 2022-02-15 Frequency: Biweekly  Progress: 50 Modality: individual  Related Interventions Monitor and encourage the client to attend to personal grooming and healthy sleeping and eating patterns; reinforce improvement. Periodically assess and monitor the client for suicide potential; intervene as necessary. Objective Implement assertiveness to communicate needs and desires to others. Target Date: 2022-02-15 Frequency: Biweekly  Progress: 70 Modality: individual  Objective Implement conflict resolution skills to alleviate interpersonal sources of depressed mood. Target Date: 2022-02-15 Frequency: Biweekly  Progress: 50 Modality: individual  4. Improve satisfaction and comfort surrounding coworker relationships. 5. Increase sense of confidence and competence in dealing with work responsibilities. Objective Develop and verbalize a plan for constructive action to reduce vocational stress. Target Date: 2022-02-15 Frequency: Biweekly  Progress: 0 Modality: individual   Related Interventions Assist the client in developing a plan to react positively to his/her  vocational situation (or assign "My Vocational Action Plan" in the Adult Psychotherapy Homework Planner by [facility]); process the proactive plan and assist in its implementation. Objective Review family-of-origin history to determine roots for interpersonal conflict. Target Date: 2022-02-15 Frequency: Biweekly  Progress: 0 Modality: individual  Related Interventions Probe the client's family-of-origin history for causes of current interpersonal conflict patterns that are being reenacted in the work setting. Objective Replace projection of responsibility for the conflict with acceptance of responsibility for own role in conflict. Target Date: 2022-02-15 Frequency: Biweekly  Progress: 0 Modality: individual  Related Interventions Confront the client's projection of responsibility for his/her behavior and feelings onto others; emphasize his/her need to examine his/her own role in the conflict. Reinforce the client's acceptance of responsibility for personal feelings and behavior as they contribute to the conflict in the work setting. 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 Target Date: 2022-02-15 Frequency: Biweekly  Progress: 0 Modality: individual  Related Interventions Clarify the nature of the client's conflicts in the work setting. Help the client identify his/her own role in the conflict, attempting to represent the other party's point of view. Objective Learn and implement problem-solving skills. Target Date: 2022-02-15 Frequency: Biweekly  Progress: 0 Modality: individual  Related Interventions Conduct Problem-Solving Therapy (see Problem-Solving Therapy by Domenick Bookbinder and Rob Hickman) using techniques such as psychoeducation, modeling, and role-playing to teach the client problem-solving skills (i.e., defining a problem specifically, generating possible solutions,  evaluating the pros and cons of each solution, selecting and implementing a plan of action, evaluating the efficacy of the plan, accepting or revising the plan); role-play application of the problem-solving skill to a real life issue (or assign "Applying Problem-Solving to Interpersonal Conflict" in the Adult Psychotherapy Homework Planner by Stephannie Li). Objective Identify patterns of similar conflict with people outside the work environment. Target Date: 2022-02-15 Frequency: Biweekly  Progress: 0 Modality: individual  Related Interventions Explore the client's patterns of interpersonal conflict that occur beyond the work setting but are repeated in the work setting. 6. Resolve the core conflict that is the source of anxiety. 7. Resolve the loss, reengaging in old relationships and initiating new contacts with others. Objective Express thoughts and feelings about the deceased that went unexpressed while the deceased was alive. Target Date: 2022-02-15 Frequency: Biweekly  Progress: 0 Modality: individual  Related Interventions Conduct an empty-chair exercise with the client where he/she focuses on expressing to the lost loved one imagined in the chair what he/she never said while that loved one was alive. Ask the client to write a letter to the lost person describing his/her fond memories and/or painful and regretful memories, and how he/she currently feels life (or assign "Dear : A Letter to a Lost Loved One" in the Adult Psychotherapy Homework Planner by [facility]); process the letter in session. Assign the client to visit the grave of the lost loved one to "talk to" the deceased and express his/her feelings. Objective Identify how avoiding dealing with loss has negatively impacted life. Target Date: 2022-02-15 Frequency: Biweekly  Progress: 0 Modality: individual  Related Interventions Ask the client to list ways that avoidance of grieving has negatively impacted his/her life. 8. Understand  personal desires, insecurities, and anxieties that make overspending possible.  Diagnosis:Major depressive disorder, recurrent episode, moderate (HCC)  Generalized anxiety disorder  Adult attention deficit disorder  Plan:  -meet again on Monday, January 03, 2022 at [facility]

## 2022-01-03 ENCOUNTER — Encounter: Payer: Self-pay | Admitting: Professional

## 2022-01-03 ENCOUNTER — Ambulatory Visit (INDEPENDENT_AMBULATORY_CARE_PROVIDER_SITE_OTHER): Payer: Self-pay | Admitting: Professional

## 2022-01-03 DIAGNOSIS — F988 Other specified behavioral and emotional disorders with onset usually occurring in childhood and adolescence: Secondary | ICD-10-CM

## 2022-01-03 DIAGNOSIS — F411 Generalized anxiety disorder: Secondary | ICD-10-CM

## 2022-01-03 DIAGNOSIS — F331 Major depressive disorder, recurrent, moderate: Secondary | ICD-10-CM

## 2022-01-03 NOTE — Progress Notes (Signed)
[facility] Counselor/Therapist Progress Note  Patient ID: Maureen [name], MRN: [id number],    Date: 01/03/2022  Time Spent: 38 minutes 910-948am  Treatment Type: Individual Therapy  Risk Assessment: Danger to Self:  No Self-injurious Behavior: No Danger to Others: No  Subjective: This session was held via video teletherapy. The patient consented to audio teletherapy and was located in her car during this session. She is aware it is the responsibility of the patient to secure confidentiality on her end of the session. The provider was in a private home office for the duration of this session.   The patient arrived on time for her webex appointment.  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 2-personal a-needs to have some conversations with her son Sharia Reeve and with Brayton Caves b-Josh -pt over-extended on daughter Imani's wedding -he was condescending -pt is not emotionally prepared to address with him c-Jessie d-Christmas -children are going to [place] to see her father's wife "mimi" -she asked her dad to spend time with them to connect   -at first he was going to be at [employer]   -she found out that their dad is going with them to [place]   -her father commented that he cannot connect with them if her ex-husband is there     -her father has never said that   -he asked Josh -pt staying in [place] and looking forward to time by herself -pt was taken back that her spouse was going to [place] because he was going to be by himself     Treatment Plan Problems Addressed-Anxiety, Depression, Grief / Loss Unresolved, Vocational Stress  Goals 1. Develop an awareness of how the avoidance of grieving has affected life and begin the healing process. 2. Enhance ability to effectively cope with the full variety of life's worries and anxieties. Objective Complete a Cost Benefit Analysis of  maintaining the anxiety. Target Date: 2022-02-15 Frequency: Biweekly  Progress: 0 Modality: individual  Related Interventions Ask the client to evaluate the costs and benefits of worries (e.g., complete the Cost Benefit Analysis exercise in Ten Days to Self-Esteem! by Lawerance Bach) in which he/she lists the advantages and disadvantages of the negative thought, fear, or anxiety; process the completed assignment. Objective Describe situations, thoughts, feelings, and actions associated with anxieties and worries, their impact on functioning, and attempts to resolve them. Target Date: 2022-02-15 Frequency: Biweekly  Progress: 0 Modality: individual  Related Interventions Focus on developing a level of trust with the client; provide support and empathy to encourage the client to feel safe in expressing his/her GAD symptoms. Ask the client to describe his/her past experiences of anxiety and their impact on functioning; assess the focus, excessiveness, and uncontrollability of the worry and the type, frequency, intensity, and duration of his/her anxiety symptoms (consider using a structured interview such as The Anxiety Disorders Interview Schedule-Adult Version). Objective Identify the major life conflicts from the past and present that form the basis for present anxiety. Target Date: 2022-02-15 Frequency: Biweekly  Progress: 0 Modality: individual  Related Interventions Ask the client to develop and process a list of key past and present life conflicts that continue to cause worry. Reinforce the client's insights into the role of his/her past emotional pain and present anxiety. Assist the client in becoming aware of key unresolved life conflicts and in starting to work toward their resolution. 3. Identify and increase intrapersonal, interpersonal, and  physical resources to foster positive coping strategies. Objective Articulate self-care methods to prevent and/or manage depression in the future; create a  list of 10 coping methods for future use. Target Date: 2022-02-15 Frequency: Biweekly  Progress: 60 Modality: individual  Related Interventions Teach the client conflict resolution skills (e.g., empathy, active listening, "I messages," respectful communication, assertiveness without aggression, compromise) to help alleviate depression; use modeling, role-playing, and behavior rehearsal to work through several current conflicts. Explore the role of interpersonal conflict in maintaining the client's depression; clarify the sources and nature of these conflicts. Assign the client to write letters to individuals who may be contributing to her distress and depression, asking that she outline what she wants in the letters; process the letters, generating solutions within an interpersonal context. Help the client resolve depression related to interpersonal problems through the use of reassurance and support, clarification of cognitive and affective triggers that ignite conflicts, and active problem-solving (or assign "Applying Problem-Solving to Interpersonal Conflict" in the Adult Psychotherapy Homework Planner, 2nd ed. by Stephannie Li). Objective Increase social contacts and communicate needs within existing interpersonal relationships. Target Date: 2022-02-15 Frequency: Biweekly  Progress: 80 Modality: individual  Objective Identify and replace cognitive self-talk that supports depression. Target Date: 2022-02-15 Frequency: Biweekly  Progress: 60 Modality: individual  Related Interventions Encourage the client to discuss cognitive distortions, including automatic thoughts (e.g., negative view of self, future, experience) and negative schemas (e.g., core beliefs about self and others based on earlier childhood experiences); assess frequency of negative self-statements associated with depression. Objective Increase the frequency of engaging in pleasant activities. Target Date: 2022-02-15 Frequency: Biweekly   Progress: 50 Modality: individual  Related Interventions Monitor and encourage the client to attend to personal grooming and healthy sleeping and eating patterns; reinforce improvement. Periodically assess and monitor the client for suicide potential; intervene as necessary. Objective Implement assertiveness to communicate needs and desires to others. Target Date: 2022-02-15 Frequency: Biweekly  Progress: 70 Modality: individual  Objective Implement conflict resolution skills to alleviate interpersonal sources of depressed mood. Target Date: 2022-02-15 Frequency: Biweekly  Progress: 50 Modality: individual  4. Improve satisfaction and comfort surrounding coworker relationships. 5. Increase sense of confidence and competence in dealing with work responsibilities. Objective Develop and verbalize a plan for constructive action to reduce vocational stress. Target Date: 2022-02-15 Frequency: Biweekly  Progress: 0 Modality: individual  Related Interventions Assist the client in developing a plan to react positively to his/her vocational situation (or assign "My Vocational Action Plan" in the Adult Psychotherapy Homework Planner by [facility]); process the proactive plan and assist in its implementation. Objective Review family-of-origin history to determine roots for interpersonal conflict. Target Date: 2022-02-15 Frequency: Biweekly  Progress: 0 Modality: individual  Related Interventions Probe the client's family-of-origin history for causes of current interpersonal conflict patterns that are being reenacted in the work setting. Objective Replace projection of responsibility for the conflict with acceptance of responsibility for own role in conflict. Target Date: 2022-02-15 Frequency: Biweekly  Progress: 0 Modality: individual  Related Interventions Confront the client's projection of responsibility for his/her behavior and feelings onto others; emphasize his/her need to examine his/her own  role in the conflict. Reinforce the client's acceptance of responsibility for personal feelings and behavior as they contribute to the conflict in the work setting. 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 Target Date: 2022-02-15 Frequency: Biweekly  Progress: 0 Modality: individual  Related Interventions Clarify the nature of the client's conflicts in the work setting. Help the client identify his/her  own role in the conflict, attempting to represent the other party's point of view. Objective Learn and implement problem-solving skills. Target Date: 2022-02-15 Frequency: Biweekly  Progress: 0 Modality: individual  Related Interventions Conduct Problem-Solving Therapy (see Problem-Solving Therapy by Domenick Bookbinder and Rob Hickman) using techniques such as psychoeducation, modeling, and role-playing to teach the client problem-solving skills (i.e., defining a problem specifically, generating possible solutions, evaluating the pros and cons of each solution, selecting and implementing a plan of action, evaluating the efficacy of the plan, accepting or revising the plan); role-play application of the problem-solving skill to a real life issue (or assign "Applying Problem-Solving to Interpersonal Conflict" in the Adult Psychotherapy Homework Planner by Stephannie Li). Objective Identify patterns of similar conflict with people outside the work environment. Target Date: 2022-02-15 Frequency: Biweekly  Progress: 0 Modality: individual  Related Interventions Explore the client's patterns of interpersonal conflict that occur beyond the work setting but are repeated in the work setting. 6. Resolve the core conflict that is the source of anxiety. 7. Resolve the loss, reengaging in old relationships and initiating new contacts with others. Objective Express thoughts and feelings about the deceased that went unexpressed while the deceased was alive. Target Date: 2022-02-15 Frequency:  Biweekly  Progress: 0 Modality: individual  Related Interventions Conduct an empty-chair exercise with the client where he/she focuses on expressing to the lost loved one imagined in the chair what he/she never said while that loved one was alive. Ask the client to write a letter to the lost person describing his/her fond memories and/or painful and regretful memories, and how he/she currently feels life (or assign "Dear : A Letter to a Lost Loved One" in the Adult Psychotherapy Homework Planner by [facility]); process the letter in session. Assign the client to visit the grave of the lost loved one to "talk to" the deceased and express his/her feelings. Objective Identify how avoiding dealing with loss has negatively impacted life. Target Date: 2022-02-15 Frequency: Biweekly  Progress: 0 Modality: individual  Related Interventions Ask the client to list ways that avoidance of grieving has negatively impacted his/her life. 8. Understand personal desires, insecurities, and anxieties that make overspending possible.  Diagnosis:Major depressive disorder, recurrent episode, moderate (HCC)  Generalized anxiety disorder  Adult attention deficit disorder  Plan:  -meet again on Wednesday, January 26, 2021 at [facility]

## 2022-01-26 ENCOUNTER — Ambulatory Visit: Payer: Self-pay | Admitting: Professional

## 2022-02-16 ENCOUNTER — Encounter: Payer: Self-pay | Admitting: Professional

## 2022-02-16 ENCOUNTER — Ambulatory Visit (INDEPENDENT_AMBULATORY_CARE_PROVIDER_SITE_OTHER): Payer: Self-pay | Admitting: Professional

## 2022-02-16 DIAGNOSIS — F331 Major depressive disorder, recurrent, moderate: Secondary | ICD-10-CM

## 2022-02-16 DIAGNOSIS — F411 Generalized anxiety disorder: Secondary | ICD-10-CM

## 2022-02-16 DIAGNOSIS — F988 Other specified behavioral and emotional disorders with onset usually occurring in childhood and adolescence: Secondary | ICD-10-CM

## 2022-02-16 NOTE — Progress Notes (Addendum)
[facility] Counselor/Therapist Progress Note  Patient ID: Maureen [name], MRN: [id number],    Date: 02/16/2022  Time Spent: 44 minutes 910-954am  Treatment Type: Individual Therapy  Risk Assessment: Danger to Self:  No Self-injurious Behavior: No Danger to Others: No  Subjective: This session was held via video teletherapy. The patient consented to audio teletherapy and was located in her car during this session. She is aware it is the responsibility of the patient to secure confidentiality on her end of the session. The provider was in a private home office for the duration of this session.   The patient arrived on time for her webex appointment.  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 2-personal a-social a-pt went on Facebook dating and felt discouraged b-prior to getting offline she kept seeing the same picture pop up -she reached out to this man, Maureen [name] of [employer] -talked online three days before Christmas -met in park and walked and talked -they spent Christmas Day, New Year's Eve, and New Year's Day together -they talk daily -he has four children, 63 year old from first marriage; a 4th grade son and two kindergarten aged daughters c-pt reports having asked God for signs of someone that she was supposed to be with should do -the sign was that he would take my hand, when they were walking in the park he took her hand and walked with her -pt saw as sign that it was safe to get to know him d-he is working on communication with the patient since he hasn't dated anyone in five months -he's a nice guy, taller than me, a big teddy bear like a football player who makes her feel secure -he's kind, his father died in 02/21/19 and his mother in six moths later he believes from a broken heart -he works in [place] and his daughters live in  [place] so he is back and forth e-they laugh a lot, he feels like he's known her before -he has a strong faith in [place], he has been in [employer] for a long time but is struggling with his faith, he has his faith but doesn't understand why things are happening f-he has met her parents and her children -he is building a nice relationship with Maureen [name] -he has talked with Maureen [name] and she checks in with her mom about him -he has not talked to Maureen [name] at all; Maureen [name] has not been around for him to meet g-children -Maureen [name] appears that he was only trying to create his adult boundaries -Maureen [name] is the most protective 3-future -pt has had to tell herself to keep moving forward in her dreams -she is enjoying being in the moment but does not plan to lose sight of her plans -she and friend/coworker Maureen [name] at going to open a [name]   -plan to open in August 2026 and are meeting frequently   -Maureen [name] will manage the admin and patient will manage the people relationships   -Maureen [name] is looking for funding resources   -uns[place] of location-[place]     -music therapy, art therapy, and horticulture therapy -plan is to take in adolescents with trauma -will be taking trauma classes through a college that provides scholarships to help with cost -they are going to start a podcast  Treatment Plan Problems Addressed  Anxiety, Depression Goals 1. Engage in healthy reciprocal  dating relationship 2. Enhance ability to effectively cope with the full variety of life's worries and anxieties. Objective Complete a Cost Benefit Analysis of maintaining the anxiety. Target Date: 2023-02-15 Frequency: Biweekly  Progress: 0 Modality: individual  Related Interventions Ask the client to evaluate the costs and benefits of worries (e.g., complete the Cost Benefit Analysis exercise in Ten Days to Self-Esteem! by Maureen [name]) in which he/she lists the advantages and disadvantages of the  negative thought, fear, or anxiety; process the completed assignment. Objective Describe situations, thoughts, feelings, and actions associated with anxieties and worries, their impact on functioning, and attempts to resolve them. Target Date: 2023-02-15 Frequency: Biweekly  Progress: 0 Modality: individual  Related Interventions Focus on developing a level of trust with the client; provide support and empathy to encourage the client to feel safe in expressing his/her GAD symptoms. Ask the client to describe his/her past experiences of anxiety and their impact on functioning; assess the focus, excessiveness, and uncontrollability of the worry and the type, frequency, intensity, and duration of his/her anxiety symptoms (consider using a structured interview such as The Anxiety Disorders Interview Schedule-Adult Version). Objective Identify the major life conflicts from the past and present that form the basis for present anxiety. Target Date: 2023-02-15 Frequency: Biweekly  Progress: 0 Modality: individual  Related Interventions Ask the client to develop and process a list of key past and present life conflicts that continue to cause worry. Reinforce the client's insights into the role of his/her past emotional pain and present anxiety. Assist the client in becoming aware of key unresolved life conflicts and in starting to work toward their resolution. Objective Actively evaluate dating relationships to ensure honest evaluation of healthy vs unheathy behaviors. Target Date: 2023-02-15 Frequency: Biweekly  Progress: 0 Modality: individual  Related Interventions Assist pt in evaluating values-based dating relationships so that patient achieves her desires for a lifelong partner. 3. Identify and increase intrapersonal, interpersonal, and physical resources to foster positive coping strategies. 4. Learn and implement coping skills that result in a reduction of anxiety and worry, and improved daily  functioning. 5. Resolve the core conflict that is the source of anxiety. Objective Maintain involvement in work, family, and social activities. Target Date: 2023-02-15 Frequency: Biweekly  Progress: 0 Modality: individual  Related Interventions Support the client in following through with work, family, and social activities rather than escaping or avoiding them to focus on anxiety.  Diagnosis:Major depressive disorder, recurrent episode, moderate (HCC)  Generalized anxiety disorder  Adult attention deficit disorder  Plan:  -meet again on Monday, March 21, 2022 at 8am by phone

## 2022-03-09 ENCOUNTER — Ambulatory Visit: Payer: Self-pay | Admitting: Professional

## 2022-03-21 ENCOUNTER — Ambulatory Visit (INDEPENDENT_AMBULATORY_CARE_PROVIDER_SITE_OTHER): Payer: Self-pay | Admitting: Professional

## 2022-03-21 ENCOUNTER — Encounter: Payer: Self-pay | Admitting: Professional

## 2022-03-21 DIAGNOSIS — F411 Generalized anxiety disorder: Secondary | ICD-10-CM

## 2022-03-21 DIAGNOSIS — F988 Other specified behavioral and emotional disorders with onset usually occurring in childhood and adolescence: Secondary | ICD-10-CM

## 2022-03-21 DIAGNOSIS — F331 Major depressive disorder, recurrent, moderate: Secondary | ICD-10-CM

## 2022-03-21 NOTE — Progress Notes (Signed)
[facility] Counselor/Therapist Progress Note  Patient ID: Maureen [name], MRN: [id number],    Date: 03/21/2022  Time Spent: 39 minutes 810-849am  Treatment Type: Individual Therapy  Risk Assessment: Danger to Self:  No Self-injurious Behavior: No Danger to Others: No  Subjective: This session was held via video teletherapy. The patient consented to audio teletherapy and was located in her car during this session. She is aware it is the responsibility of the patient to secure confidentiality on her end of the session. The provider was in a private home office for the duration of this session.   The patient arrived on time for her webex appointment.  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 and how they are connecting -there were two other black women and they were all in the high trauma category d-Devin's mom was alcoholic -very together, holds it together -characterizes it as a healthy relationship e-bff Debbie lives in [place] -Debbie over-extends self but does not address her issues -when Jackelyn Poling is ready to talk it's explosive -she came to visit pt for her birthday a couple weeks ago   -it was hard to be around her because she was so miserable 2-personal a-physical- thyroid issues -has had to have medication adjusted -has been fatigued -has not been able to walk b-feels at a crossroads in life c-always feeling she is not doing enough -"I tell that to myself" d-Chris -doesn't have trauma but has things that are "heavy" -she considers herself dating Gerald Stabs   -"it's really hard" -"I'm not trying to fix anything" -pt admits she has had to stop back with a few things and let him figure it out -"he's a  kind man"  Treatment Plan Problems Addressed  Anxiety, Depression Goals 1. Engage in healthy reciprocal dating relationship 2. Enhance ability to effectively cope with the full variety of life's worries and anxieties. Objective Complete a Cost Benefit Analysis of maintaining the anxiety. Target Date: 2023-02-15 Frequency: Biweekly  Progress: 0 Modality: individual  Related Interventions Ask the client to evaluate the costs and benefits of worries (e.g., complete the Cost Benefit Analysis exercise in Ten Days to Self-Esteem! by Quay Burow) in which he/she lists the advantages and disadvantages of the negative thought, fear, or anxiety; process the completed assignment. Objective Describe situations, thoughts, feelings, and actions associated with anxieties and worries, their impact on functioning, and attempts to resolve them. Target Date: 2023-02-15 Frequency: Biweekly  Progress: 0 Modality: individual  Related Interventions Focus on developing a level of trust with the client; provide support and empathy to encourage the client to feel safe in expressing his/her GAD symptoms. Ask the client to describe his/her past experiences of anxiety and their impact on functioning; assess the focus, excessiveness, and uncontrollability of the worry and the type, frequency, intensity, and duration of his/her anxiety symptoms (consider using a structured interview such as The Anxiety Disorders Interview Schedule-Adult Version). Objective Identify the major life conflicts from the past and present that form the basis for present anxiety. Target Date: 2023-02-15 Frequency: Biweekly  Progress: 0 Modality: individual  Related Interventions Ask the client to develop and process a list of key past and present life conflicts that continue to cause worry. Reinforce the client's insights into the role  of his/her past emotional pain and present anxiety. Assist the client in becoming aware of key unresolved life  conflicts and in starting to work toward their resolution. Objective Actively evaluate dating relationships to ensure honest evaluation of healthy vs unheathy behaviors. Target Date: 2023-02-15 Frequency: Biweekly  Progress: 0 Modality: individual  Related Interventions Assist pt in evaluating values-based dating relationships so that patient achieves her desires for a lifelong partner. 3. Identify and increase intrapersonal, interpersonal, and physical resources to foster positive coping strategies. 4. Learn and implement coping skills that result in a reduction of anxiety and worry, and improved daily functioning. 5. Resolve the core conflict that is the source of anxiety. Objective Maintain involvement in work, family, and social activities. Target Date: 2023-02-15 Frequency: Biweekly  Progress: 0 Modality: individual  Related Interventions Support the client in following through with work, family, and social activities rather than escaping or avoiding them to focus on anxiety.  Diagnosis:Major depressive disorder, recurrent episode, moderate (HCC)  Generalized anxiety disorder  Adult attention deficit disorder  Plan:  -meet again on Monday, March 30, 2022 at 9am by phone

## 2022-03-30 ENCOUNTER — Ambulatory Visit: Payer: Self-pay | Admitting: Professional

## 2022-04-20 ENCOUNTER — Ambulatory Visit (INDEPENDENT_AMBULATORY_CARE_PROVIDER_SITE_OTHER): Payer: Self-pay | Admitting: Professional

## 2022-04-20 ENCOUNTER — Encounter: Payer: Self-pay | Admitting: Professional

## 2022-04-20 DIAGNOSIS — F331 Major depressive disorder, recurrent, moderate: Secondary | ICD-10-CM

## 2022-04-20 DIAGNOSIS — F411 Generalized anxiety disorder: Secondary | ICD-10-CM

## 2022-04-20 DIAGNOSIS — F988 Other specified behavioral and emotional disorders with onset usually occurring in childhood and adolescence: Secondary | ICD-10-CM

## 2022-04-20 NOTE — Progress Notes (Signed)
[facility] Counselor/Therapist Progress Note  Patient ID: Maureen [name], MRN: [id number],    Date: 04/20/2022  Time Spent: 47 minutes 901-948am  Treatment Type: Individual Therapy  Risk Assessment: Danger to Self:  No Self-injurious Behavior: No Danger to Others: No  Subjective: This session was held via video teletherapy. The patient consented to audio teletherapy and was located in her car during this session. She is aware it is the responsibility of the patient to secure confidentiality on her end of the session. The provider was in a private home office for the duration of this session.   The patient arrived on time for her webex appointment.  Issues addressed: 1- personal a-pt has decided if she wants to look for a job that makes more money -she has been considering what to do about the house; considering moving and selling -pt plans to be still b-on spring break -spending time daily with her -has been resting while on break -working to organize her finances since cost has risen so much c-self-care -pt feels like she is in "friend-mode" -being honest with herself about her willingness to move forward in relationship -pt notices she feels exhausted in the relationship but is now emotionally invested -trying to figure out how to manage -pt noticed that Harrell Gave is just like her mother and brother and she cannot count on them -pt admits that she needs to "friend zone" him and just support him -pt feels "extremely sad" about knowing that she needs to end the relationship d-Christopher -has been going through depression and anxiety -he lost a job in last six months and got  new job, trying to get back on track financially -his behavior was erratic and pt told him she cannot do this anymore   -that is when he got scheduled for help -he is now in treatment -wants more of a connection like they had when they first met -he sometimes gets so frustrated he  steps back -communication in morning in good and then it fizzles out -pt admits that he doesn't appear to be in a place where he is ready for a relationship  Treatment Plan Problems Addressed  Anxiety, Depression Goals 1. Engage in healthy reciprocal dating relationship 2. Enhance ability to effectively cope with the full variety of life's worries and anxieties. Objective Complete a Cost Benefit Analysis of maintaining the anxiety. Target Date: 2023-02-15 Frequency: Biweekly  Progress: 0 Modality: individual  Related Interventions Ask the client to evaluate the costs and benefits of worries (e.g., complete the Cost Benefit Analysis exercise in Ten Days to Self-Esteem! by Quay Burow) in which he/she lists the advantages and disadvantages of the negative thought, fear, or anxiety; process the completed assignment. Objective Describe situations, thoughts, feelings, and actions associated with anxieties and worries, their impact on functioning, and attempts to resolve them. Target Date: 2023-02-15 Frequency: Biweekly  Progress: 0 Modality: individual  Related Interventions Focus on developing a level of trust with the client; provide support and empathy to encourage the client to feel safe in expressing his/her GAD symptoms. Ask the client to describe his/her past experiences of anxiety and their impact on functioning; assess the focus, excessiveness, and uncontrollability of the worry and the type, frequency, intensity, and duration of his/her anxiety symptoms (consider using a structured interview such as The Anxiety Disorders Interview Schedule-Adult Version). Objective Identify the major life conflicts from the past and present that form the basis for present anxiety. Target Date: 2023-02-15 Frequency: Biweekly  Progress: 0 Modality: individual  Related Interventions Ask the client to develop and process a list of key past and present life conflicts that continue to cause worry. Reinforce the  client's insights into the role of his/her past emotional pain and present anxiety. Assist the client in becoming aware of key unresolved life conflicts and in starting to work toward their resolution. Objective Actively evaluate dating relationships to ensure honest evaluation of healthy vs unheathy behaviors. Target Date: 2023-02-15 Frequency: Biweekly  Progress: 0 Modality: individual  Related Interventions Assist pt in evaluating values-based dating relationships so that patient achieves her desires for a lifelong partner. 3. Identify and increase intrapersonal, interpersonal, and physical resources to foster positive coping strategies. 4. Learn and implement coping skills that result in a reduction of anxiety and worry, and improved daily functioning. 5. Resolve the core conflict that is the source of anxiety. Objective Maintain involvement in work, family, and social activities. Target Date: 2023-02-15 Frequency: Biweekly  Progress: 0 Modality: individual  Related Interventions Support the client in following through with work, family, and social activities rather than escaping or avoiding them to focus on anxiety.  Diagnosis:Major depressive disorder, recurrent episode, moderate  Generalized anxiety disorder  Adult attention deficit disorder  Plan:  -meet again on Wednesday, May 11, 2022 at 2pm.

## 2022-05-11 ENCOUNTER — Ambulatory Visit (INDEPENDENT_AMBULATORY_CARE_PROVIDER_SITE_OTHER): Payer: Self-pay | Admitting: Professional

## 2022-05-11 ENCOUNTER — Ambulatory Visit: Payer: Self-pay | Admitting: Professional

## 2022-05-11 ENCOUNTER — Encounter: Payer: Self-pay | Admitting: Professional

## 2022-05-11 DIAGNOSIS — F411 Generalized anxiety disorder: Secondary | ICD-10-CM

## 2022-05-11 DIAGNOSIS — F988 Other specified behavioral and emotional disorders with onset usually occurring in childhood and adolescence: Secondary | ICD-10-CM

## 2022-05-11 DIAGNOSIS — F331 Major depressive disorder, recurrent, moderate: Secondary | ICD-10-CM

## 2022-05-11 NOTE — Progress Notes (Signed)
[facility] Counselor/Therapist Progress Note  Patient ID: Maureen [name], MRN: [id number],    Date: 05/11/2022  Time Spent: 38 minutes 203-241pm  Treatment Type: Individual Therapy  Risk Assessment: Danger to Self:  No Self-injurious Behavior: No Danger to Others: No  Subjective: This session was held via video teletherapy. The patient consented to audio teletherapy and was located in her car during this session. She is aware it is the responsibility of the patient to secure confidentiality on her end of the session. The provider was in a private home office for the duration of this session.   The patient arrived on time for her webex appointment.  Issues addressed: 1- personal a-pt ended her relationship with Cristal Deer b-she attempted to talk to him for five minutes and he said he wasn't available -he called her two times after she told him not to call her -he texted her and said he was in the hospital  -she called [facility] and asked it he was there and she said he was there for lab work and was gone from the ED -he called and said it was not a good time for him and she stopped him and told him she wanted no contact -pt would not permit him to say that he is not in a place for a relationship -he kept calling her back and she told him she no longer wanted to talk to hi and she hung up on him -yesterday was a hard day for the pt, she called in to work, and then at 1030am went to work to get herself going -she went to work thirty minutes early today so she could make up lost time from yesterday -pt admits that she feels hopeless in a way since she has been praying for so long to settle down -pt admits to feeling hurt because she really cared about this person deeply -pt questioned God why she keeps getting with people that are not good for her and does she not have any self-worth -God revealed that in the past she would have stayed for years -pt said moving forward she  needs to identify patterns of how self-consumed he was -the lack of dependability is so big for her -pt was happy with her pace of the relationship -much of their conversation was by phone and would only answer when he wanted to   -it was pretty much right away -"I think he uses women as resources" -she thinks he started a different way with her and it didn't work out the way he thought   -"I think he had feelings and he really didn't want to have feelings" -pt admits that a lot of her thoughts about him were fantasy based -he would tell her that they are "on the path to consider marriage" -he appeared to lead her on as nothing that she thought about was from things he had first said 2-professional -pt trying to figure out education -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 Engage in healthy reciprocal dating relationship 2. Enhance ability to effectively cope with the full variety of life's worries and anxieties. Objective Complete a Cost Benefit Analysis of maintaining the anxiety. Target Date: 2023-02-15 Frequency:  Biweekly  Progress: 0 Modality: individual  Related Interventions Ask the client to evaluate the costs and benefits of worries (e.g., complete the Cost Benefit Analysis exercise in Ten Days to Self-Esteem! by Lawerance Bach) in which he/she lists the advantages and disadvantages of the negative thought, fear, or anxiety; process the completed assignment. Objective Describe situations, thoughts, feelings, and actions associated with anxieties and worries, their impact on functioning, and attempts to resolve them. Target Date: 2023-02-15 Frequency: Biweekly  Progress: 0 Modality: individual   Related Interventions Focus on developing a level of trust with the client; provide support and empathy to encourage the client to feel safe in expressing his/her GAD symptoms. Ask the client to describe his/her past experiences of anxiety and their impact on functioning; assess the focus, excessiveness, and uncontrollability of the worry and the type, frequency, intensity, and duration of his/her anxiety symptoms (consider using a structured interview such as The Anxiety Disorders Interview Schedule-Adult Version). Objective Identify the major life conflicts from the past and present that form the basis for present anxiety. Target Date: 2023-02-15 Frequency: Biweekly  Progress: 0 Modality: individual  Related Interventions Ask the client to develop and process a list of key past and present life conflicts that continue to cause worry. Reinforce the client's insights into the role of his/her past emotional pain and present anxiety. Assist the client in becoming aware of key unresolved life conflicts and in starting to work toward their resolution. Objective Actively evaluate dating relationships to ensure honest evaluation of healthy vs unheathy behaviors. Target Date: 2023-02-15 Frequency: Biweekly  Progress: 0 Modality: individual  Related Interventions Assist pt in evaluating values-based dating relationships so that patient achieves her desires for a lifelong partner. 3. Identify and increase intrapersonal, interpersonal, and physical resources to foster positive coping strategies. 4. Learn and implement coping skills that result in a reduction of anxiety and worry, and improved daily functioning. 5. Resolve the core conflict that is the source of anxiety. Objective Maintain involvement in work, family, and social activities. Target Date: 2023-02-15 Frequency: Biweekly  Progress: 0 Modality: individual  Related Interventions Support the client in following through with work, family,  and social activities rather than escaping or avoiding them to focus on anxiety.  Diagnosis:Major depressive disorder, recurrent episode, moderate  Generalized anxiety disorder  Adult attention deficit disorder  Plan:  -go on a date and share results at next session -work on [facility] -meet again on Wednesday, Jun 01, 2022 at 9am.

## 2022-06-01 ENCOUNTER — Ambulatory Visit (INDEPENDENT_AMBULATORY_CARE_PROVIDER_SITE_OTHER): Payer: Self-pay | Admitting: Professional

## 2022-06-01 ENCOUNTER — Encounter: Payer: Self-pay | Admitting: Professional

## 2022-06-01 DIAGNOSIS — F411 Generalized anxiety disorder: Secondary | ICD-10-CM

## 2022-06-01 DIAGNOSIS — F988 Other specified behavioral and emotional disorders with onset usually occurring in childhood and adolescence: Secondary | ICD-10-CM

## 2022-06-01 DIAGNOSIS — F331 Major depressive disorder, recurrent, moderate: Secondary | ICD-10-CM

## 2022-06-01 NOTE — Progress Notes (Signed)
[facility] Counselor/Therapist Progress Note  Patient ID: Maureen [name], MRN: [id number],    Date: 06/01/2022  Time Spent: 50 minutes 901-951am  Treatment Type: Individual Therapy  Risk Assessment: Danger to Self:  No Self-injurious Behavior: No Danger to Others: No  Subjective: This session was held via video teletherapy. The patient consented to audio teletherapy and was located in her car during this session. She is aware it is the responsibility of the patient to secure confidentiality on her end of the session. The provider was in a private home office for the duration of this session.   The patient arrived on time for her Caregility appointment.  Issues addressed: 1- homework-completed a-go on a date and share results at next session -had a date with old friend [place] and he didn't show up -she was chatting with someone over the weekend but she doesn't have an attraction b-work on [employer]- she decided she is not going that route and is looking into some other things -has talked to a friend who is a Research scientist (medical) and will help her walk her through 2-personal a-pt admits she didn't realize how emotionally attached to [place] she was  -he still continues to send her pictures/videos of the kids and of himself -pt feels upset with herself that she allowed herself to get so invested in the first relationship -she allowed him to break through her boundaries -he matched her list of what she wanted in a man and admits that she thinks that might have been driving her -she realizes the only thing he had was the physical aspects of what she wanted -she doesn't recall him every asking about her -she asked him last week if he wanted her to wait for him -he said he can't ask her to wait or him -he said he was not emotionally in a place to invest -she went back on Facebook dating and saw him there -he had changed his description with words that she has said to him -seeing  him on the Facebook dating site was the "breaker, I really would not want you in my life" -he did not tell her about the four children having four different moms and "the other situations" where they were one night stands -thinking a lot about relationship with parents and it's never enough time for them -doesn't want to date someone that has children -he was trying to get custody of his two six-year old daughters and the attorney told him he had eight children, three of whom he does not know 2-professional -pt trying to figure out education -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 Engage in healthy reciprocal dating relationship 2. Enhance ability to effectively cope with the full variety of life's worries and anxieties. Objective Complete a Cost Benefit Analysis of maintaining the anxiety. Target Date: 2023-02-15 Frequency: Biweekly  Progress: 0 Modality: individual  Related Interventions Ask the client to evaluate the costs and benefits of worries (e.g., complete the Cost Benefit Analysis exercise in Ten Days to Self-Esteem!  by Lawerance Bach) in which he/she lists the advantages and disadvantages of the negative thought, fear, or anxiety; process the completed assignment. Objective Describe situations, thoughts, feelings, and actions associated with anxieties and worries, their impact on functioning, and attempts to resolve them. Target Date: 2023-02-15 Frequency: Biweekly  Progress: 0 Modality: individual  Related  Interventions Focus on developing a level of trust with the client; provide support and empathy to encourage the client to feel safe in expressing his/her GAD symptoms. Ask the client to describe his/her past experiences of anxiety and their impact on functioning; assess the focus, excessiveness, and uncontrollability of the worry and the type, frequency, intensity, and duration of his/her anxiety symptoms (consider using a structured interview such as The Anxiety Disorders Interview Schedule-Adult Version). Objective Identify the major life conflicts from the past and present that form the basis for present anxiety. Target Date: 2023-02-15 Frequency: Biweekly  Progress: 0 Modality: individual  Related Interventions Ask the client to develop and process a list of key past and present life conflicts that continue to cause worry. Reinforce the client's insights into the role of his/her past emotional pain and present anxiety. Assist the client in becoming aware of key unresolved life conflicts and in starting to work toward their resolution. Objective Actively evaluate dating relationships to ensure honest evaluation of healthy vs unheathy behaviors. Target Date: 2023-02-15 Frequency: Biweekly  Progress: 0 Modality: individual  Related Interventions Assist pt in evaluating values-based dating relationships so that patient achieves her desires for a lifelong partner. 3. Identify and increase intrapersonal, interpersonal, and physical resources to foster positive coping strategies. 4. Learn and implement coping skills that result in a reduction of anxiety and worry, and improved daily functioning. 5. Resolve the core conflict that is the source of anxiety. Objective Maintain involvement in work, family, and social activities. Target Date: 2023-02-15 Frequency: Biweekly  Progress: 0 Modality: individual  Related Interventions Support the client in following through with work, family, and social  activities rather than escaping or avoiding them to focus on anxiety.  Diagnosis:Major depressive disorder, recurrent episode, moderate (HCC)  Generalized anxiety disorder  Adult attention deficit disorder  Plan:  -writing the history of her relationship with Cristal Deer -looking into a club -meet again on Wednesday, Jun 15, 2022 at 9am.

## 2022-06-15 ENCOUNTER — Ambulatory Visit (INDEPENDENT_AMBULATORY_CARE_PROVIDER_SITE_OTHER): Payer: Self-pay | Admitting: Professional

## 2022-06-15 ENCOUNTER — Encounter: Payer: Self-pay | Admitting: Professional

## 2022-06-15 DIAGNOSIS — F988 Other specified behavioral and emotional disorders with onset usually occurring in childhood and adolescence: Secondary | ICD-10-CM

## 2022-06-15 DIAGNOSIS — F331 Major depressive disorder, recurrent, moderate: Secondary | ICD-10-CM

## 2022-06-15 DIAGNOSIS — F411 Generalized anxiety disorder: Secondary | ICD-10-CM

## 2022-06-15 NOTE — Progress Notes (Signed)
[facility] Counselor/Therapist Progress Note  Patient ID: Maureen [name], MRN: [id number],    Date: 06/15/2022  Time Spent:  46 minutes [id number]  Treatment Type: Individual Therapy  Risk Assessment: Danger to Self:  No Self-injurious Behavior: No Danger to Others: No  Subjective: This session was held via video teletherapy. The patient consented to audio teletherapy and was located in her car during this session. She is aware it is the responsibility of the patient to secure confidentiality on her end of the session. The provider was in a private home office for the duration of this session.   The patient arrived on time for her Caregility appointment.  Issues addressed: 1- homework- partially a-writing the history of her relationship with Cristal Deer -looking into a club- she and Devin have started a bible study 2-went out on two dates a-one was not the right guy but he was very nice -he had no teeth b-second guy Alden Server is a Investment banker, corporate and they are still talking -pt admits that it's hard for her to talk to him because he is so different and she's not controlling the narrative -he challenges her spiritually -he has good knowledge and insight -he challenged that her dog Simonne Come was her man -he asked it you trust God so much why are you holding on to all these things God is to have taken from you -he is part of [employer] in [place] -she met him at [facility] when he was on his way to pick up his mother from the airport -they talked for 2/5 hours -"I've never had a man who is a man, I've always managed them" -pt doesn't know how to be "in this" -pt feels that it is too much too soon but she literally prayed for somebody that she could have an actual conversation with -pt describes relationship as a spiritual level -pt admits that she worries about if she is the right person for him -pt talked with her pastor who suggested she watch and listen -pt feels the last  event happened with Cristal Deer "took me out"   -he liked her as a friend on facebook and she blocked him -pt didn't realize how she comes across to men -she can be very scripted with her questions, "when I'm done talking, I'm done talking, it's protection" -pt is off school this week and she is enjoying her time -pt feels confused -to have a "man like that" she has things that she has to let go -Alden Server has stated that he wants you to be who God wants her to be -pt admits that she has to slow down because she normally goes 100 miles an hour -she would be fine with seeing him one time per week and talking a few times a week -she was texting him all the time and he would text back but it was texting to make sure he wasn't going anywhere -she has seen Alden Server three times and they talk daily -she is drawn to physical characteristics ut she wants someone who can lead his family -she does not want someone  Treatment Plan Problems Addressed  Anxiety, Depression Goals 1. Engage in healthy reciprocal dating relationship 2. Enhance ability to effectively cope with the full variety of life's worries and anxieties. Objective Complete a Cost Benefit Analysis of maintaining the anxiety. Target Date: 2023-02-15 Frequency: Biweekly  Progress: 0 Modality: individual  Related Interventions Ask the client to evaluate the costs and benefits of worries (e.g., complete the Cost Benefit Analysis exercise  in Ten Days to Self-Esteem! by Lawerance Bach) in which he/she lists the advantages and disadvantages of the negative thought, fear, or anxiety; process the completed assignment. Objective Describe situations, thoughts, feelings, and actions associated with anxieties and worries, their impact on functioning, and attempts to resolve them. Target Date: 2023-02-15 Frequency: Biweekly  Progress: 0 Modality: individual  Related Interventions Focus on developing a level of trust with the client; provide support and empathy  to encourage the client to feel safe in expressing his/her GAD symptoms. Ask the client to describe his/her past experiences of anxiety and their impact on functioning; assess the focus, excessiveness, and uncontrollability of the worry and the type, frequency, intensity, and duration of his/her anxiety symptoms (consider using a structured interview such as The Anxiety Disorders Interview Schedule-Adult Version). Objective Identify the major life conflicts from the past and present that form the basis for present anxiety. Target Date: 2023-02-15 Frequency: Biweekly  Progress: 0 Modality: individual  Related Interventions Ask the client to develop and process a list of key past and present life conflicts that continue to cause worry. Reinforce the client's insights into the role of his/her past emotional pain and present anxiety. Assist the client in becoming aware of key unresolved life conflicts and in starting to work toward their resolution. Objective Actively evaluate dating relationships to ensure honest evaluation of healthy vs unheathy behaviors. Target Date: 2023-02-15 Frequency: Biweekly  Progress: 0 Modality: individual  Related Interventions Assist pt in evaluating values-based dating relationships so that patient achieves her desires for a lifelong partner. 3. Identify and increase intrapersonal, interpersonal, and physical resources to foster positive coping strategies. 4. Learn and implement coping skills that result in a reduction of anxiety and worry, and improved daily functioning. 5. Resolve the core conflict that is the source of anxiety. Objective Maintain involvement in work, family, and social activities. Target Date: 2023-02-15 Frequency: Biweekly  Progress: 0 Modality: individual  Related Interventions Support the client in following through with work, family, and social activities rather than escaping or avoiding them to focus on anxiety.  Diagnosis:Major  depressive disorder, recurrent episode, moderate (HCC)  Generalized anxiety disorder  Adult attention deficit disorder  Plan:  -wants to focus on digging deeper into her childhood so she can get past it -meet again on Wednesday, July 06, 2022 at 9am.

## 2022-06-18 IMAGING — CR DG CHEST 2V
2 series · 2 of 2 positions shown · non-contrast
Comparison: None.

CLINICAL DATA: Cough.

EXAM:
CHEST - 2 VIEW

[w chest pa]
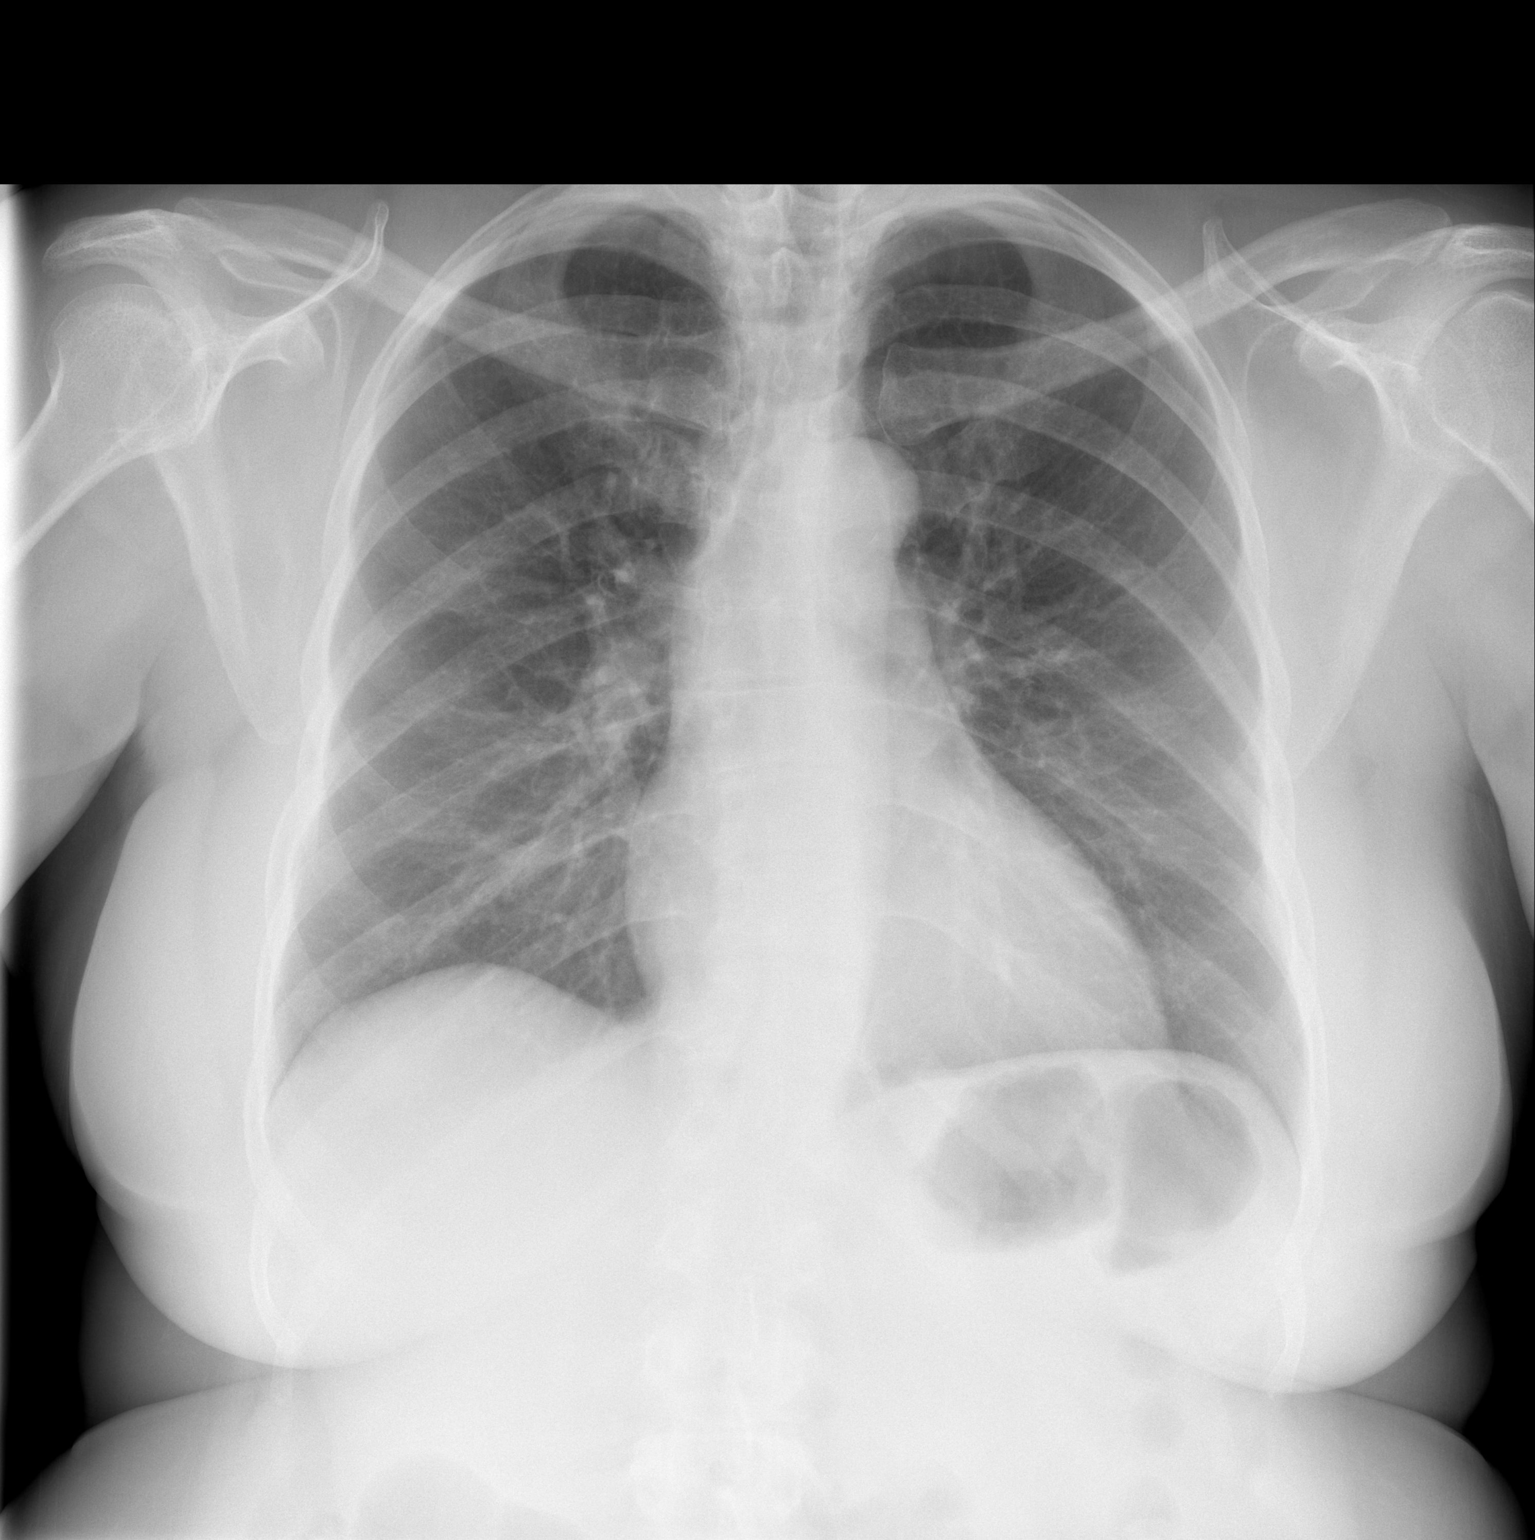

[w chest lat]
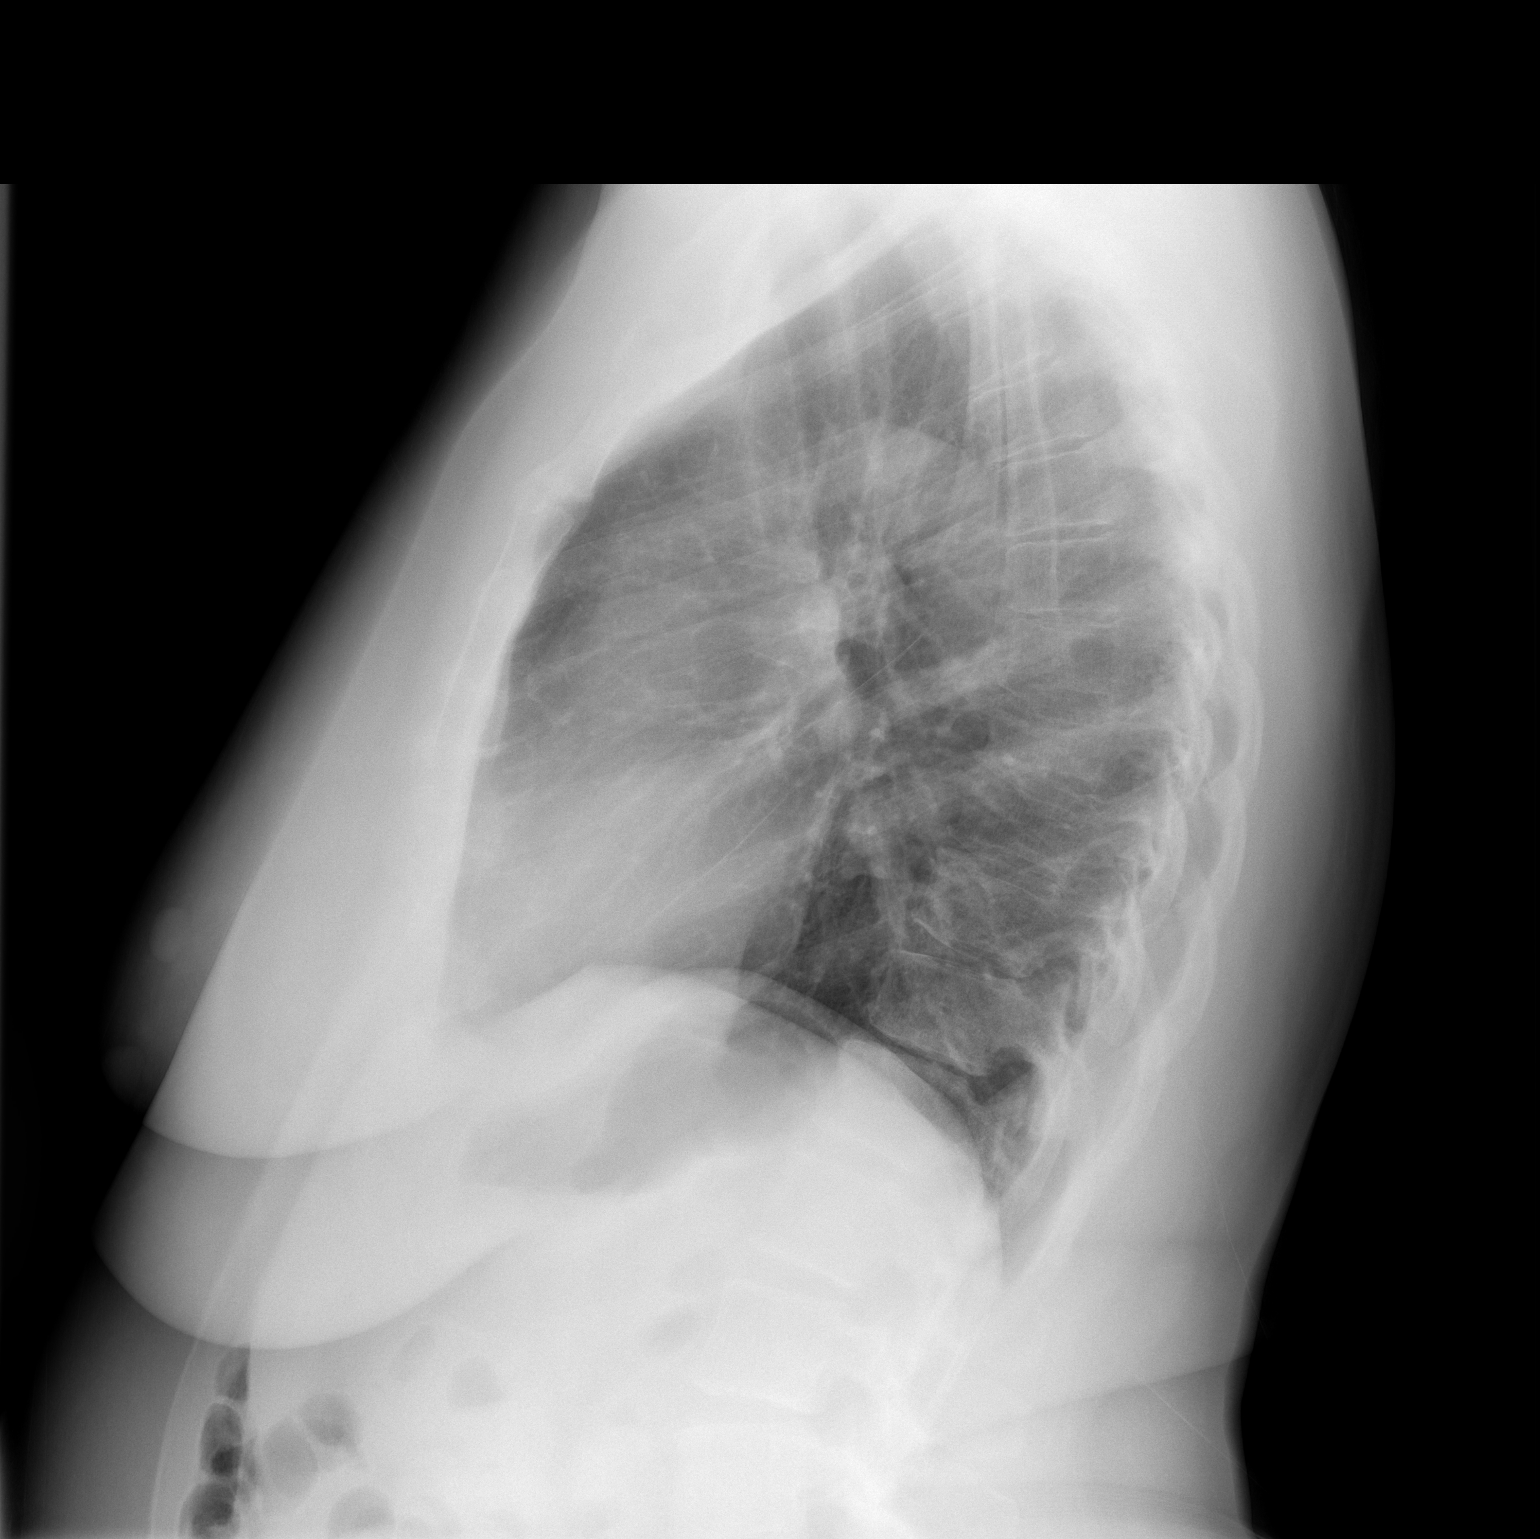

[2 of 2 positions shown; findings below may reference images not displayed]

FINDINGS: The heart size and mediastinal contours are within normal limits.
Both lungs are clear. The visualized skeletal structures are
unremarkable.
IMPRESSION: No active cardiopulmonary disease.

## 2022-07-05 ENCOUNTER — Ambulatory Visit: Payer: Self-pay | Admitting: Professional

## 2022-07-06 ENCOUNTER — Ambulatory Visit: Payer: Self-pay | Admitting: Professional

## 2022-07-19 ENCOUNTER — Encounter: Payer: Self-pay | Admitting: Professional

## 2022-07-19 ENCOUNTER — Ambulatory Visit (INDEPENDENT_AMBULATORY_CARE_PROVIDER_SITE_OTHER): Payer: Self-pay | Admitting: Professional

## 2022-07-19 DIAGNOSIS — F411 Generalized anxiety disorder: Secondary | ICD-10-CM

## 2022-07-19 DIAGNOSIS — F988 Other specified behavioral and emotional disorders with onset usually occurring in childhood and adolescence: Secondary | ICD-10-CM

## 2022-07-19 DIAGNOSIS — F331 Major depressive disorder, recurrent, moderate: Secondary | ICD-10-CM

## 2022-07-19 NOTE — Progress Notes (Signed)
[facility] Counselor/Therapist Progress Note  Patient ID: Maureen [name], MRN: [id number],    Date: 07/19/2022  Time Spent:  47 minutes 858-945am  Treatment Type: Individual Therapy  Risk Assessment: Danger to Self:  No Self-injurious Behavior: No Danger to Others: No  Subjective: This session was held via video teletherapy. The patient consented to audio teletherapy and was located in her car during this session. She is aware it is the responsibility of the patient to secure confidentiality on her end of the session. The provider was in a private home office for the duration of this session.   The patient arrived on time for her Caregility appointment.  Issues addressed: 1-wants to focus on digging deeper into her childhood so she can get past it  2-professional -she is off this week 3-personal -her daughter had the baby reveal; her daughter is going to have a girl -she started "running" on Thursday and just this morning realized how tired -her bff from [place] came for a girls weekend -she, her bff, and friend from [place] stayed Saturday night at the [place], went to dinner, a movie -her bff helped her to drop off things to her ex-bf Thayer Ohm' friend -she alerted Thayer Ohm what she was going -his friend appeared surprised and shared he really needs to get his life together -pt admits she has the closure -she can see where she allowed him to cross her boundaries  -pt felt emotionally spent as a result -is going to her parents in [place] -Alden Server -"he's really weird" -"I had to hang up on him" he is very controlling in regarding to what he knows, it didn't feel right -"it felt like he was trying to take over" -Berna Spare -met shortly after things ended with Alden Server -they have only talked on the phone -he lives 1.5 hours away, his parents live in [place] -he works as a International aid/development worker in a jail setting and is on the internal swat team -they have been talking three weeks and will meet for the  first time this weekend -pt is focusing on being more social with her friends -[facility] through [place] -went on June 19th and spent four hours -[place] -has been asked by Ms Delorise Shiner to be on her board for the 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 Engage in healthy reciprocal dating relationship 2. Enhance ability to effectively cope with the full variety of life's worries and anxieties. Objective Complete a Cost Benefit Analysis of maintaining the anxiety. Target Date: 2023-02-15 Frequency: Biweekly  Progress: 0 Modality: individual  Related Interventions Ask the client to evaluate the costs and benefits of worries (e.g., complete the Cost Benefit Analysis exercise in Ten Days to Self-Esteem! by Lawerance Bach) in which he/she lists the advantages and disadvantages of the negative thought, fear, or anxiety; process the completed assignment. Objective Describe situations, thoughts, feelings, and actions associated with anxieties and worries, their impact on functioning, and attempts to resolve them. Target Date: 2023-02-15 Frequency: Biweekly  Progress: 0 Modality: individual  Related Interventions Focus on developing a level of trust with the client; provide support  and empathy to encourage the client to feel safe in expressing his/her GAD symptoms. Ask the client to describe his/her past experiences of anxiety and their impact on functioning; assess the focus, excessiveness, and uncontrollability of the worry and the type, frequency, intensity, and duration of his/her anxiety  symptoms (consider using a structured interview such as The Anxiety Disorders Interview Schedule-Adult Version). Objective Identify the major life conflicts from the past and present that form the basis for present anxiety. Target Date: 2023-02-15 Frequency: Biweekly  Progress: 0 Modality: individual  Related Interventions Ask the client to develop and process a list of key past and present life conflicts that continue to cause worry. Reinforce the client's insights into the role of his/her past emotional pain and present anxiety. Assist the client in becoming aware of key unresolved life conflicts and in starting to work toward their resolution. Objective Actively evaluate dating relationships to ensure honest evaluation of healthy vs unheathy behaviors. Target Date: 2023-02-15 Frequency: Biweekly  Progress: 0 Modality: individual  Related Interventions Assist pt in evaluating values-based dating relationships so that patient achieves her desires for a lifelong partner. 3. Identify and increase intrapersonal, interpersonal, and physical resources to foster positive coping strategies. 4. Learn and implement coping skills that result in a reduction of anxiety and worry, and improved daily functioning. 5. Resolve the core conflict that is the source of anxiety. Objective Maintain involvement in work, family, and social activities. Target Date: 2023-02-15 Frequency: Biweekly  Progress: 0 Modality: individual  Related Interventions Support the client in following through with work, family, and social activities rather than escaping or avoiding them to focus on anxiety.  Diagnosis:Major depressive disorder, recurrent episode, moderate (HCC)  Generalized anxiety disorder  Adult attention deficit disorder  Plan:  -meet again on Friday, August 12, 2022 at 1pm virtually.

## 2022-08-12 ENCOUNTER — Ambulatory Visit: Payer: BC Managed Care – PPO | Admitting: Professional

## 2022-08-17 ENCOUNTER — Ambulatory Visit: Payer: BC Managed Care – PPO | Admitting: Professional

## 2022-08-30 ENCOUNTER — Encounter: Payer: Self-pay | Admitting: Professional

## 2022-08-30 ENCOUNTER — Ambulatory Visit: Payer: BC Managed Care – PPO | Admitting: Professional

## 2022-08-30 DIAGNOSIS — F988 Other specified behavioral and emotional disorders with onset usually occurring in childhood and adolescence: Secondary | ICD-10-CM | POA: Diagnosis not present

## 2022-08-30 DIAGNOSIS — F411 Generalized anxiety disorder: Secondary | ICD-10-CM

## 2022-08-30 DIAGNOSIS — F331 Major depressive disorder, recurrent, moderate: Secondary | ICD-10-CM

## 2022-08-30 NOTE — Progress Notes (Signed)
[facility] Counselor/Therapist Progress Note  Patient ID: Maureen [name], MRN: [id number],    Date: 08/30/2022  Time Spent:  54 minutes 9-954am  Treatment Type: Individual Therapy  Risk Assessment: Danger to Self:  No Self-injurious Behavior: No Danger to Others: No  Subjective: This session was held via video teletherapy. The patient consented to video teletherapy and was located in her office during this session. She is aware it is the responsibility of the patient to secure confidentiality on her end of the session. The provider was in a private home office for the duration of this session.   The patient arrived on time for her Caregility appointment.  Issues addressed: 1-pt feels heavy -she feels physically tired -exhausted at work -she is not resting at home like she needs to -knows she needs time away but isn't going to see her folks until the end of the month -Simonne Come is demanding with his going out every couple hours 2-strategies -trying to write things downs and manage -get away to jot things down and journal -her parents home is a place for reprieve 3-calendar is busy -daughter Jessica's birthday  -needs time away from the regular routine -plan time to be away at the beach 4-physical -has started estrogen replacement -sleep issues -shutting TV off an hour early -crating Simonne Come earlier -listening to music for relaxation 5-dating Berna Spare -things are going well -feels needy and she thinks it is related to other experiences -they talk in am and pm -pt has expectations of Berna Spare but he is not aware -how to trust the process -"he puts me first in a lot of things" -"he's not like anyone I ever dated" -"he's open, he talks to me about things 6-brother Ree Kida called her after two years -pt could not believe that it was him -she kept telling Simonne Come it was her brother -she was so excited that he called -she had talked to best friend the day before to check on  him -he said he was sorry for not keeping in touch -he had been through two years of pure hell in his life -he got scammed [employer], had to go to court for his apartment -pt and their father sent him a note that she needed to know he was okay -pt was angry with her father for helping Ree Kida financially 7-children -praying for her children -her daughter is due in November 8-non-profit "[name]", her aunt that took her in when her mom was on drugs, connected her with dad and stepmother, took care of pt and brother, -had first board meeting this past Saturday and it was amazing -opening Sept 2026  -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 Engage in healthy reciprocal dating relationship 2. Enhance ability to effectively cope with the full variety of life's worries and anxieties. Objective Complete a Cost Benefit Analysis of maintaining the anxiety. Target Date: 2023-02-15 Frequency: Biweekly  Progress: 0 Modality: individual  Related Interventions Ask the client to evaluate the costs and benefits of worries (e.g., complete the Cost Benefit Analysis exercise in Ten Days to Self-Esteem! by Lawerance Bach) in  which he/she lists the advantages and disadvantages of the negative thought, fear, or anxiety; process the completed assignment. Objective Describe situations, thoughts, feelings, and actions associated with anxieties and worries, their impact on functioning, and attempts to resolve them. Target Date: 2023-02-15 Frequency: Biweekly  Progress: 0 Modality: individual  Related  Interventions Focus on developing a level of trust with the client; provide support and empathy to encourage the client to feel safe in expressing his/her GAD symptoms. Ask the client to describe his/her past experiences of anxiety and their impact on functioning; assess the focus, excessiveness, and uncontrollability of the worry and the type, frequency, intensity, and duration of his/her anxiety symptoms (consider using a structured interview such as The Anxiety Disorders Interview Schedule-Adult Version). Objective Identify the major life conflicts from the past and present that form the basis for present anxiety. Target Date: 2023-02-15 Frequency: Biweekly  Progress: 0 Modality: individual  Related Interventions Ask the client to develop and process a list of key past and present life conflicts that continue to cause worry. Reinforce the client's insights into the role of his/her past emotional pain and present anxiety. Assist the client in becoming aware of key unresolved life conflicts and in starting to work toward their resolution. Objective Actively evaluate dating relationships to ensure honest evaluation of healthy vs unheathy behaviors. Target Date: 2023-02-15 Frequency: Biweekly  Progress: 0 Modality: individual  Related Interventions Assist pt in evaluating values-based dating relationships so that patient achieves her desires for a lifelong partner. 3. Identify and increase intrapersonal, interpersonal, and physical resources to foster positive coping strategies. 4. Learn and implement coping skills that result in a reduction of anxiety and worry, and improved daily functioning. 5. Resolve the core conflict that is the source of anxiety. Objective Maintain involvement in work, family, and social activities. Target Date: 2023-02-15 Frequency: Biweekly  Progress: 0 Modality: individual  Related Interventions Support the client in following through with work, family, and social  activities rather than escaping or avoiding them to focus on anxiety.  Diagnosis:Major depressive disorder, recurrent episode, moderate (HCC)  Generalized anxiety disorder  Adult attention deficit disorder  Plan:  -meet again on Tuesday, September 13, 2022 at 9am virtually.

## 2022-09-07 ENCOUNTER — Ambulatory Visit: Payer: BC Managed Care – PPO | Admitting: Professional

## 2022-09-13 ENCOUNTER — Encounter: Payer: Self-pay | Admitting: Professional

## 2022-09-13 ENCOUNTER — Ambulatory Visit (INDEPENDENT_AMBULATORY_CARE_PROVIDER_SITE_OTHER): Payer: BC Managed Care – PPO | Admitting: Professional

## 2022-09-13 DIAGNOSIS — F411 Generalized anxiety disorder: Secondary | ICD-10-CM

## 2022-09-13 DIAGNOSIS — F988 Other specified behavioral and emotional disorders with onset usually occurring in childhood and adolescence: Secondary | ICD-10-CM

## 2022-09-13 DIAGNOSIS — F331 Major depressive disorder, recurrent, moderate: Secondary | ICD-10-CM

## 2022-09-13 NOTE — Progress Notes (Signed)
[name]  Patient ID: Maureen [name], MRN: [id number],    Date: 09/13/2022  Time Spent:  25 minutes 905-930am  Treatment Type: Individual Therapy  Risk Assessment: Danger to Self:  No Self-injurious Behavior: No Danger to Others: No  Subjective: This session was held via video teletherapy. The patient consented to video teletherapy and was located in her office during this session. She is aware it is the responsibility of the patient to secure confidentiality on her end of the session. The provider was in a private home office for the duration of this session.   The patient arrived late for her Caregility appointment.  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 -she has seen 7-8 successfully completed the program -pt was talking with God about her need to leave -the difficult part of the job is that her friend is the supervisor -she is not the greatest supervisor as she has not had any great supervisors -pt was frustrated that her boss texted staff yesterday to  inform she was giving a higher grade   -pt addressed her concern and that as a teen they agreed to not give grades away   -pt was told that they are a caring empathetic school and pt does not believe in giving grades -pt has a lot of flexibility 5-children -son started teaching at his hs in [employer] and he coaches as well -Sallyanne Kuster is doing well with pregnancy -Brayton Caves lives with her bf in [place] and began  cosmetology school at [employer] moving into her apartment in [place] today 6-non-profit "[name]" -continues to move forward with planning -she is struggling with connecting to her creativity right now -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 Engage in healthy reciprocal dating relationship 2. Enhance ability to effectively cope with the full variety of life's worries and anxieties. Objective Complete a Cost Benefit Analysis of maintaining the anxiety. Target Date: 2023-02-15 Frequency: Biweekly  Progress: 0 Modality: individual  Related Interventions Ask the client to evaluate the costs and benefits of worries (e.g., complete the Cost Benefit Analysis exercise in Ten Days to Self-Esteem! by Lawerance Bach) in which he/she lists the advantages and disadvantages of the negative thought, fear, or anxiety; process the completed assignment. Objective Describe situations, thoughts, feelings, and actions associated with anxieties and worries, their impact on functioning, and attempts to resolve them. Target Date: 2023-02-15 Frequency: Biweekly  Progress: 0 Modality: individual  Related Interventions Focus on developing a level of trust with the client; provide support and empathy to encourage the client to feel safe in expressing his/her GAD symptoms. Ask the client to describe his/her past experiences of anxiety and their impact on functioning; assess the focus, excessiveness, and uncontrollability of the worry and the type, frequency, intensity, and duration of his/her anxiety symptoms (consider using a structured interview such as The Anxiety Disorders Interview Schedule-Adult Version). Objective Identify  the major life conflicts from the past and present that form the basis for present anxiety. Target Date: 2023-02-15 Frequency: Biweekly  Progress:  0 Modality: individual  Related Interventions Ask the client to develop and process a list of key past and present life conflicts that continue to cause worry. Reinforce the client's insights into the role of his/her past emotional pain and present anxiety. Assist the client in becoming aware of key unresolved life conflicts and in starting to work toward their resolution. Objective Actively evaluate dating relationships to ensure honest evaluation of healthy vs unheathy behaviors. Target Date: 2023-02-15 Frequency: Biweekly  Progress: 0 Modality: individual  Related Interventions Assist pt in evaluating values-based dating relationships so that patient achieves her desires for a lifelong partner. 3. Identify and increase intrapersonal, interpersonal, and physical resources to foster positive coping strategies. 4. Learn and implement coping skills that result in a reduction of anxiety and worry, and improved daily functioning. 5. Resolve the core conflict that is the source of anxiety. Objective Maintain involvement in work, family, and social activities. Target Date: 2023-02-15 Frequency: Biweekly  Progress: 0 Modality: individual  Related Interventions Support the client in following through with work, family, and social activities rather than escaping or avoiding them to focus on anxiety.  Diagnosis:Major depressive disorder, recurrent episode, moderate (HCC)  Generalized anxiety disorder  Adult attention deficit disorder  Plan:  -meet again on Wednesday, September 28, 2022 at 9am virtually.

## 2022-09-28 ENCOUNTER — Ambulatory Visit (INDEPENDENT_AMBULATORY_CARE_PROVIDER_SITE_OTHER): Payer: BC Managed Care – PPO | Admitting: Professional

## 2022-09-28 ENCOUNTER — Encounter: Payer: Self-pay | Admitting: Professional

## 2022-09-28 DIAGNOSIS — F988 Other specified behavioral and emotional disorders with onset usually occurring in childhood and adolescence: Secondary | ICD-10-CM

## 2022-09-28 DIAGNOSIS — F331 Major depressive disorder, recurrent, moderate: Secondary | ICD-10-CM

## 2022-09-28 DIAGNOSIS — F411 Generalized anxiety disorder: Secondary | ICD-10-CM

## 2022-09-28 NOTE — Progress Notes (Signed)
[facility] Counselor/Therapist Progress Note  Patient ID: ABELA HALBRITTER, MRN: [id number],    Date: 09/28/2022  Time Spent:  41 minutes 901-942am  Treatment Type: Individual Therapy  Risk Assessment: Danger to Self:  No Self-injurious Behavior: No Danger to Others: No  Subjective: This session was held via video teletherapy. The patient consented to video teletherapy and was located in her office during this session. She is aware it is the responsibility of the patient to secure confidentiality on her end of the session. The provider was in a private home office for the duration of this session.   The patient arrived late for her Caregility appointment.  Issues addressed: 1-children a-daughter Celene Kras living in [place] returned home -transition with her is happening -with Celene Kras gone she felt like a piece of her was too far away -pt tearfully shared that she is trying to take on life because the days are really hard b-Jesse remarked that they are all struggling -pt reminded her that they are adulting -pt feels heavy because she can't fix everything -pt pleased that her children will never be without someone who can help them 2-brother -had to go to shelter because he was evicted -his dad is trying to support -pt cannot call because she is so sad and cannot talk to him without crying 3-grief -has had a tough week -mourning her mother and her uncle and realizing that entire family is gone 4-financial -pt feels a lot of responsibility taking care of self -she is working for much less as a Runner, 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 -looking for PT employment 5-boyfriend -doing okay -he texted for a few days and she was concerned -he was sick and did not tell her -going through stages where they are learning how to respond to each other -she feels able to communicate in an effective -she does consider him to be supportive -she is moving slowly in relationship to better  understand where he is emotionally -if he is not with her emotionally she will end the relationship -pt feels protective of herself due to herself due to other relationships Pt feels excited about what is happening in her life and how strong she is becoming -her hair is done daily by her daughter, she dressed for herself daily -she texted her pastor and thanked her for being a strong woman and how that has helped  Treatment Plan Problems Addressed  Anxiety, Depression Goals 1. Engage in healthy reciprocal dating relationship 2. Enhance ability to effectively cope with the full variety of life's worries and anxieties. Objective Complete a Cost Benefit Analysis of maintaining the anxiety. Target Date: 2023-02-15 Frequency: Biweekly  Progress: 0 Modality: individual  Related Interventions Ask the client to evaluate the costs and benefits of worries (e.g., complete the Cost Benefit Analysis exercise in Ten Days to Self-Esteem! by Lawerance Bach) in which he/she lists the advantages and disadvantages of the negative thought, fear, or anxiety; process the completed assignment. Objective Describe situations, thoughts, feelings, and actions associated with anxieties and worries, their impact on functioning, and attempts to resolve them. Target Date: 2023-02-15 Frequency: Biweekly  Progress: 0 Modality: individual  Related Interventions Focus on developing a level of trust with the client; provide support and empathy to encourage the client to feel safe in expressing his/her GAD symptoms. Ask the client to describe his/her past experiences of anxiety and their impact on functioning; assess the focus, excessiveness, and uncontrollability of the worry and the type, frequency, intensity, and duration of his/her anxiety  symptoms (consider using a structured interview such as The Anxiety Disorders Interview Schedule-Adult Version). Objective Identify the major life conflicts from the past and present that form  the basis for present anxiety. Target Date: 2023-02-15 Frequency: Biweekly  Progress: 0 Modality: individual  Related Interventions Ask the client to develop and process a list of key past and present life conflicts that continue to cause worry. Reinforce the client's insights into the role of his/her past emotional pain and present anxiety. Assist the client in becoming aware of key unresolved life conflicts and in starting to work toward their resolution. Objective Actively evaluate dating relationships to ensure honest evaluation of healthy vs unheathy behaviors. Target Date: 2023-02-15 Frequency: Biweekly  Progress: 0 Modality: individual  Related Interventions Assist pt in evaluating values-based dating relationships so that patient achieves her desires for a lifelong partner. 3. Identify and increase intrapersonal, interpersonal, and physical resources to foster positive coping strategies. 4. Learn and implement coping skills that result in a reduction of anxiety and worry, and improved daily functioning. 5. Resolve the core conflict that is the source of anxiety. Objective Maintain involvement in work, family, and social activities. Target Date: 2023-02-15 Frequency: Biweekly  Progress: 0 Modality: individual  Related Interventions Support the client in following through with work, family, and social activities rather than escaping or avoiding them to focus on anxiety.  Diagnosis:Major depressive disorder, recurrent episode, moderate (HCC)  Generalized anxiety disorder  Adult attention deficit disorder  Plan:  -going to [employer] with her friend Benjamine Mola at end of September -working on her prayer life -working on getting things done at school -meet again on Wednesday, October 19, 2022 at 9am virtually.

## 2022-10-04 ENCOUNTER — Ambulatory Visit: Payer: BC Managed Care – PPO | Admitting: Professional

## 2022-10-13 ENCOUNTER — Ambulatory Visit (INDEPENDENT_AMBULATORY_CARE_PROVIDER_SITE_OTHER): Payer: Self-pay | Admitting: Professional

## 2022-10-13 ENCOUNTER — Telehealth (HOSPITAL_COMMUNITY): Payer: Self-pay | Admitting: Psychiatry

## 2022-10-13 ENCOUNTER — Encounter: Payer: Self-pay | Admitting: Professional

## 2022-10-13 DIAGNOSIS — F4001 Agoraphobia with panic disorder: Secondary | ICD-10-CM

## 2022-10-13 DIAGNOSIS — F411 Generalized anxiety disorder: Secondary | ICD-10-CM

## 2022-10-13 DIAGNOSIS — F988 Other specified behavioral and emotional disorders with onset usually occurring in childhood and adolescence: Secondary | ICD-10-CM

## 2022-10-13 DIAGNOSIS — F331 Major depressive disorder, recurrent, moderate: Secondary | ICD-10-CM

## 2022-10-13 NOTE — Progress Notes (Signed)
[facility] Counselor/Therapist Progress Note  Patient ID: Maureen [name], MRN: [id number],    Date: 10/13/2022  Time Spent:  47 minutes 803-850am  Treatment Type: Individual Therapy  Risk Assessment: Danger to Self:  No Self-injurious Behavior: No Danger to Others: No  Subjective: This session was held via video teletherapy. The patient consented to video teletherapy and was located in her office during this session. She is aware it is the responsibility of the patient to secure confidentiality on her end of the session. The provider was in a private home office for the duration of this session.   The patient arrived late for her Caregility appointment. Patient appointment is provided as an urgent appointment at patient's request due to increased depression, anhedonia, increased sleeping with no restorative sleep, feeling drained, and sad.  Issues addressed: 1-homework-not reviewed -going to [employer] with her friend Benjamine Mola at end of September -working on her prayer life -working on getting things done at school 2-mood a-pt overwhelmed -"I'm just so tired" -"I always believed that it would be better and then all of this rejection" -not one thing caused the issue, it was things adding up -it's been big things and little things 3-professional -work is a trigger when having to deal with the girls issues -she has considered talking to [employer] about the difficulty she experiences with being triggered -she is having to take days off in general to refresh -the groups of girls they have now are more emotional and very vocal 3-relationships -thinks about the future a lot and doesn't want to be alone -she has been thinking about her mom and uncle and how much she hates death and loss -so used to doing everything for everyone else -when she was a younger child living with her mother's family she felt loved -when she went to live with dad she had no identity 4-treatment  options -discussed IOP program and consideration of FMLA -pt willing to enter IOP and permitted referral via email b-managing herself until she get into Intensive Outpatient (IOP) -doesn't fee like being around people is helpful -get out of home -exercise -progressive muscle relaxation -strategies to stay in the here and now  Treatment Plan Problems Addressed  Anxiety, Depression Goals 1. Engage in healthy reciprocal dating relationship 2. Enhance ability to effectively cope with the full variety of life's worries and anxieties. Objective Complete a Cost Benefit Analysis of maintaining the anxiety. Target Date: 2023-02-15 Frequency: Biweekly  Progress: 0 Modality: individual  Related Interventions Ask the client to evaluate the costs and benefits of worries (e.g., complete the Cost Benefit Analysis exercise in Ten Days to Self-Esteem! by Lawerance Bach) in which he/she lists the advantages and disadvantages of the negative thought, fear, or anxiety; process the completed assignment. Objective Describe situations, thoughts, feelings, and actions associated with anxieties and worries, their impact on functioning, and attempts to resolve them. Target Date: 2023-02-15 Frequency: Biweekly  Progress: 0 Modality: individual  Related Interventions Focus on developing a level of trust with the client; provide support and empathy to encourage the client to feel safe in expressing his/her GAD symptoms. Ask the client to describe his/her past experiences of anxiety and their impact on functioning; assess the focus, excessiveness, and uncontrollability of the worry and the type, frequency, intensity, and duration of his/her anxiety symptoms (consider using a structured interview such as The Anxiety Disorders Interview Schedule-Adult Version). Objective Identify the major life conflicts from the past and present that form the basis for present anxiety. Target Date:  2023-02-15 Frequency: Biweekly  Progress:  0 Modality: individual  Related Interventions Ask the client to develop and process a list of key past and present life conflicts that continue to cause worry. Reinforce the client's insights into the role of his/her past emotional pain and present anxiety. Assist the client in becoming aware of key unresolved life conflicts and in starting to work toward their resolution. Objective Actively evaluate dating relationships to ensure honest evaluation of healthy vs unheathy behaviors. Target Date: 2023-02-15 Frequency: Biweekly  Progress: 0 Modality: individual  Related Interventions Assist pt in evaluating values-based dating relationships so that patient achieves her desires for a lifelong partner. 3. Identify and increase intrapersonal, interpersonal, and physical resources to foster positive coping strategies. 4. Learn and implement coping skills that result in a reduction of anxiety and worry, and improved daily functioning. 5. Resolve the core conflict that is the source of anxiety. Objective Maintain involvement in work, family, and social activities. Target Date: 2023-02-15 Frequency: Biweekly  Progress: 0 Modality: individual  Related Interventions Support the client in following through with work, family, and social activities rather than escaping or avoiding them to focus on anxiety.  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

## 2022-10-13 NOTE — Telephone Encounter (Signed)
D:  Maureen [name], [facility] referred pt to virtual MH-IOP.  A:  Placed call to orient pt.  Pt declined at this time d/t financial reasons.  "If the program is more than one to two weeks, I will be without any income coming in and I just can't do that." Provided pt with support.  Mentioned the free/inexpensive groups at the [employer] and [facility] [employer].  Provided pt with phone #'s.   Encouraged pt to give case manager a call back if she changes her mind in the future.  Inform Olegario Messier.  R:  Pt receptive.

## 2022-10-19 ENCOUNTER — Ambulatory Visit (INDEPENDENT_AMBULATORY_CARE_PROVIDER_SITE_OTHER): Payer: Self-pay | Admitting: Professional

## 2022-10-19 ENCOUNTER — Encounter: Payer: Self-pay | Admitting: Professional

## 2022-10-19 DIAGNOSIS — F411 Generalized anxiety disorder: Secondary | ICD-10-CM

## 2022-10-19 DIAGNOSIS — F331 Major depressive disorder, recurrent, moderate: Secondary | ICD-10-CM

## 2022-10-19 DIAGNOSIS — F988 Other specified behavioral and emotional disorders with onset usually occurring in childhood and adolescence: Secondary | ICD-10-CM

## 2022-10-19 NOTE — Progress Notes (Signed)
[facility] Counselor/Therapist Progress Note  Patient ID: Maureen [name], MRN: [id number],    Date: 10/19/2022  Time Spent:  47 minutes 9-946am  Treatment Type: Individual Therapy  Risk Assessment: Danger to Self:  No Self-injurious Behavior: No Danger to Others: No  Subjective: This session was held via video teletherapy. The patient consented to video teletherapy and was located in her office during this session. She is aware it is the responsibility of the patient to secure confidentiality on her end of the session. The provider was in a private home office for the duration of this session.   The patient arrived late for her Caregility appointment. Patient appointment is provided as an urgent appointment at patient's request due to increased depression, anhedonia, increased sleeping with no restorative sleep, feeling drained, and sad.  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 [place] 2-personal -the house closes in on me -pt putting house on marker in the spring -reached out to two friends and that helped -she stayed out of the house intentionally last weekend -pt has fear of failing -pt doesn't want to put boundaries on herself anymore -looked into [facility] -patient began reading Safe People again and is using the workbook -pt is learning who safe people are across her life, and  -pt has made shifts in her home to give her a new perspective -pt has realized that she has never looked at the character of the men she has dated -pt admits the mounting feeling of failure took over -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 -pt is not ready to date -pt is invested in increasing her spiritual investment -"I had to go back to my first love" -pt feel like I'm new, there are no barriers -pt is feeling a need to give to others in a way she has not done -"I feel like I'm beginning again" feels she is moving toward a new self -pt made an intentional decision to allow God to claim her live and for her to follow -pt is starting to say no to her children; Jesse's car is in the shop and she wanted her mom's help -"I'm starting to say no, that hurts a lot but I know I keep putting myself last" -pt wants to give herself permission to feel -pt wants to not be afraid to feel -depressed at a 5, prior appointment patient was at a 10  Treatment Plan Problems Addressed  Anxiety, Depression Goals 1. Engage in healthy reciprocal dating relationship 2. Enhance ability to effectively cope with the full variety of life's worries and anxieties. Objective Complete a Cost Benefit Analysis of maintaining the anxiety. Target Date: 2023-02-15 Frequency: Biweekly  Progress: 0 Modality: individual  Related Interventions Ask the client to evaluate the costs and benefits of worries (e.g., complete the Cost Benefit Analysis exercise in Ten Days to Self-Esteem! by Lawerance Bach) in which he/she lists the advantages and disadvantages of the negative thought, fear, or anxiety; process the completed assignment. Objective Describe situations, thoughts, feelings, and actions associated with anxieties and worries, their impact on functioning, and attempts to resolve them. Target Date: 2023-02-15 Frequency: Biweekly  Progress: 0 Modality: individual  Related Interventions Focus on developing a level of trust with the client; provide support and empathy to encourage the client to feel safe in expressing his/her GAD symptoms. Ask the client to describe his/her past experiences of anxiety and their impact on functioning; assess the focus,  excessiveness, and uncontrollability of the worry and the type, frequency, intensity, and duration of his/her anxiety symptoms (consider using a structured interview such as The Anxiety Disorders Interview Schedule-Adult Version). Objective Identify the major life conflicts from the past and present that form the basis for present anxiety. Target Date: 2023-02-15 Frequency: Biweekly  Progress: 0 Modality: individual  Related Interventions Ask the client to develop and process a list of key past and present life conflicts that continue to cause worry. Reinforce the client's insights into the role of his/her past emotional pain and present anxiety. Assist the client in becoming aware of key unresolved life conflicts and in starting to work toward their resolution. Objective Actively evaluate dating relationships to ensure honest evaluation of healthy vs unheathy behaviors. Target Date: 2023-02-15 Frequency: Biweekly  Progress: 0 Modality: individual  Related Interventions Assist pt in evaluating values-based dating relationships so that patient achieves her desires for a lifelong partner. 3. Identify and increase intrapersonal, interpersonal, and physical resources to foster positive coping strategies. 4. Learn and implement coping skills that result in a reduction of anxiety and worry, and improved daily functioning. 5. Resolve the core conflict that is the source of anxiety. Objective Maintain involvement in work, family, and social activities. Target Date: 2023-02-15 Frequency: Biweekly  Progress: 0 Modality: individual  Related Interventions Support the client in following through with work, family, and social activities rather than escaping or avoiding them to focus on anxiety.  Diagnosis:Major depressive disorder, recurrent episode, moderate (HCC)  Generalized anxiety disorder  Adult attention deficit disorder  Plan:  -meet again on Tuesday, November 01, 2022 at 9am virtually.

## 2022-10-25 ENCOUNTER — Ambulatory Visit: Payer: BC Managed Care – PPO | Admitting: Professional

## 2022-11-01 ENCOUNTER — Encounter: Payer: Self-pay | Admitting: Professional

## 2022-11-01 ENCOUNTER — Ambulatory Visit (INDEPENDENT_AMBULATORY_CARE_PROVIDER_SITE_OTHER): Payer: Self-pay | Admitting: Professional

## 2022-11-01 DIAGNOSIS — F988 Other specified behavioral and emotional disorders with onset usually occurring in childhood and adolescence: Secondary | ICD-10-CM

## 2022-11-01 DIAGNOSIS — F411 Generalized anxiety disorder: Secondary | ICD-10-CM

## 2022-11-01 DIAGNOSIS — F331 Major depressive disorder, recurrent, moderate: Secondary | ICD-10-CM

## 2022-11-01 NOTE — Progress Notes (Signed)
[facility] Counselor/Therapist Progress Note  Patient ID: Maureen [name], MRN: [id number],    Date: 11/01/2022  Time Spent:  41 minutes 906-948am  Treatment Type: Individual Therapy  Risk Assessment: Danger to Self:  No Self-injurious Behavior: No Danger to Others: No  Subjective: This session was held via video teletherapy. The patient consented to video teletherapy and was located in her office during this session. She is aware it is the responsibility of the patient to secure confidentiality on her end of the session. The provider was in a private home office for the duration of this session.   The patient arrived late for her Caregility appointment. Patient appointment is provided as an urgent appointment at patient's request due to increased depression, anhedonia, increased sleeping with no restorative sleep, feeling drained, and sad.  Issues addressed: 1-personal -pt is feeling better than last week -pt describes herself as sensitive, like to the touch, pt feels bruised -pt trying not to dwell on things -when at work she focuses on work -when she gets in the car after work she thinks about Recruitment consultant -she plans to begin listening to a new podcast 2-sleep -she remembers her dreams -she has always had an active dream life -as a child, her dreams were pleasant and helped her cope with her life 3-relationships -dating for a period of time that you can see how the person is on a seasonal basis -she does not want someone who puts his family of origin first -wants someone who will take care of his home -financial decisions are made together -someone who is a really good communicator -someone who is willing to answer the questions the patient has -a man who can communicate through the struggles   -she remains in touch with Berna Spare because "he is so down, he is really really down" -pt received a call from [place] he wanted to catch up -pt feels guarded-listens to  every word people say -pt observes people and watches to allow them to show her who they are -pt plans to start her activity list, sell house is spring, go on a mission trip, looks at [employer], go on some trips -she doesn't want to backslide by giving them to much power over her, she wants to set clear boundaries since they are necessary; pt admits she is stronger and willing to say what is acceptable  Treatment Plan Problems Addressed  Anxiety, Depression Goals 1. Engage in healthy reciprocal dating relationship 2. Enhance ability to effectively cope with the full variety of life's worries and anxieties. Objective Complete a Cost Benefit Analysis of maintaining the anxiety. Target Date: 2023-02-15 Frequency: Biweekly  Progress: 0 Modality: individual  Related Interventions Ask the client to evaluate the costs and benefits of worries (e.g., complete the Cost Benefit Analysis exercise in Ten Days to Self-Esteem! by Lawerance Bach) in which he/she lists the advantages and disadvantages of the negative thought, fear, or anxiety; process the completed assignment. Objective Describe situations, thoughts, feelings, and actions associated with anxieties and worries, their impact on functioning, and attempts to resolve them. Target Date: 2023-02-15 Frequency: Biweekly  Progress: 0 Modality: individual  Related Interventions Focus on developing a level of trust with the client; provide support and empathy to encourage the client to feel safe in expressing his/her GAD symptoms. Ask the client to describe his/her past experiences of anxiety and their impact on functioning; assess the focus, excessiveness, and uncontrollability of the worry and the type, frequency, intensity, and duration of his/her anxiety symptoms (consider using  a structured interview such as The Anxiety Disorders Interview Schedule-Adult Version). Objective Identify the major life conflicts from the past and present that form the basis for  present anxiety. Target Date: 2023-02-15 Frequency: Biweekly  Progress: 0 Modality: individual  Related Interventions Ask the client to develop and process a list of key past and present life conflicts that continue to cause worry. Reinforce the client's insights into the role of his/her past emotional pain and present anxiety. Assist the client in becoming aware of key unresolved life conflicts and in starting to work toward their resolution. Objective Actively evaluate dating relationships to ensure honest evaluation of healthy vs unheathy behaviors. Target Date: 2023-02-15 Frequency: Biweekly  Progress: 0 Modality: individual  Related Interventions Assist pt in evaluating values-based dating relationships so that patient achieves her desires for a lifelong partner. 3. Identify and increase intrapersonal, interpersonal, and physical resources to foster positive coping strategies. 4. Learn and implement coping skills that result in a reduction of anxiety and worry, and improved daily functioning. 5. Resolve the core conflict that is the source of anxiety. Objective Maintain involvement in work, family, and social activities. Target Date: 2023-02-15 Frequency: Biweekly  Progress: 0 Modality: individual  Related Interventions Support the client in following through with work, family, and social activities rather than escaping or avoiding them to focus on anxiety.  Diagnosis:Major depressive disorder, recurrent episode, moderate (HCC)  Generalized anxiety disorder  Adult attention deficit disorder  Plan:  -create activity list -evaluating how to keep boundaries with Steward Ros moving forward; what have I noticed, what have I paid attention to -meet again on Wednesday, November 16, 2022 at 9am virtually.

## 2022-11-15 ENCOUNTER — Ambulatory Visit: Payer: BC Managed Care – PPO | Admitting: Professional

## 2022-11-16 ENCOUNTER — Encounter: Payer: Self-pay | Admitting: Professional

## 2022-11-16 ENCOUNTER — Ambulatory Visit (INDEPENDENT_AMBULATORY_CARE_PROVIDER_SITE_OTHER): Payer: Self-pay | Admitting: Professional

## 2022-11-16 DIAGNOSIS — F988 Other specified behavioral and emotional disorders with onset usually occurring in childhood and adolescence: Secondary | ICD-10-CM

## 2022-11-16 DIAGNOSIS — F411 Generalized anxiety disorder: Secondary | ICD-10-CM

## 2022-11-16 DIAGNOSIS — F331 Major depressive disorder, recurrent, moderate: Secondary | ICD-10-CM

## 2022-11-16 NOTE — Progress Notes (Signed)
[facility] Counselor/Therapist Progress Note  Patient ID: Maureen [name], MRN: [id number],    Date: 11/16/2022  Time Spent:  26 minutes 920-946am  Treatment Type: Individual Therapy  Risk Assessment: Danger to Self:  No Self-injurious Behavior: No Danger to Others: No  Subjective: This session was held via video teletherapy. The patient consented to video teletherapy and was located in her office during this session. She is aware it is the responsibility of the patient to secure confidentiality on her end of the session. The provider was in a private home office for the duration of this session.   The patient arrived late for her Caregility appointment due to having forgot her appointment.  Issues addressed: 1-homework- completed -create activity list 2-mood -pt thinks she will need to meet twice monthly -she needs the support and having the check in to help her through -trying to move forward and focus  3-grief -she has began attending grief share classes -she is feeling regret -I don't know how to go forward -pt spending a lot of time in prayer to work through some things -"I feel like I should have taken care of my mom more" -her uncle mentioned cancer two years before and she did nothing with the information -pt questioned why she puts up with things and treat me in ways that are unacceptable to me and not speak up -"what's the why behind why I permit people, why am I not cautious" -consider writing a letter to your mother unpacking her regrets -pt realizes its not her being disappointed in me because she is not here -it has made her a lot more aware of the parents that are still here -"Ive never wanted to make mistakes, if I made a mistake I gelt horrible" -notices she doesn't take feedback well and beats herself up for not doing a better job 4-introduced EMDR -reviewed purpose of EMDR -pt interested in using technigue -will begin EMDR at next  visit  Treatment Plan Problems Addressed  Anxiety, Depression Goals 1. Engage in healthy reciprocal dating relationship 2. Enhance ability to effectively cope with the full variety of life's worries and anxieties. Objective Complete a Cost Benefit Analysis of maintaining the anxiety. Target Date: 2023-02-15 Frequency: Biweekly  Progress: 0 Modality: individual  Related Interventions Ask the client to evaluate the costs and benefits of worries (e.g., complete the Cost Benefit Analysis exercise in Ten Days to Self-Esteem! by Lawerance Bach) in which he/she lists the advantages and disadvantages of the negative thought, fear, or anxiety; process the completed assignment. Objective Describe situations, thoughts, feelings, and actions associated with anxieties and worries, their impact on functioning, and attempts to resolve them. Target Date: 2023-02-15 Frequency: Biweekly  Progress: 0 Modality: individual  Related Interventions Focus on developing a level of trust with the client; provide support and empathy to encourage the client to feel safe in expressing his/her GAD symptoms. Ask the client to describe his/her past experiences of anxiety and their impact on functioning; assess the focus, excessiveness, and uncontrollability of the worry and the type, frequency, intensity, and duration of his/her anxiety symptoms (consider using a structured interview such as The Anxiety Disorders Interview Schedule-Adult Version). Objective Identify the major life conflicts from the past and present that form the basis for present anxiety. Target Date: 2023-02-15 Frequency: Biweekly  Progress: 0 Modality: individual  Related Interventions Ask the client to develop and process a list of key past and present life conflicts that continue to cause worry. Reinforce the client's insights into  the role of his/her past emotional pain and present anxiety. Assist the client in becoming aware of key unresolved life  conflicts and in starting to work toward their resolution. Objective Actively evaluate dating relationships to ensure honest evaluation of healthy vs unheathy behaviors. Target Date: 2023-02-15 Frequency: Biweekly  Progress: 0 Modality: individual  Related Interventions Assist pt in evaluating values-based dating relationships so that patient achieves her desires for a lifelong partner. 3. Identify and increase intrapersonal, interpersonal, and physical resources to foster positive coping strategies. 4. Learn and implement coping skills that result in a reduction of anxiety and worry, and improved daily functioning. 5. Resolve the core conflict that is the source of anxiety. Objective Maintain involvement in work, family, and social activities. Target Date: 2023-02-15 Frequency: Biweekly  Progress: 0 Modality: individual  Related Interventions Support the client in following through with work, family, and social activities rather than escaping or avoiding them to focus on anxiety.  Diagnosis:Major depressive disorder, recurrent episode, moderate (HCC)  Generalized anxiety disorder  Adult attention deficit disorder  Plan:  -begin EMDR at next session -meet again on Tuesday, November 29, 2022 at 8am by phone.

## 2022-11-29 ENCOUNTER — Ambulatory Visit: Payer: PRIVATE HEALTH INSURANCE | Admitting: Professional

## 2022-12-12 ENCOUNTER — Encounter: Payer: Self-pay | Admitting: Professional

## 2022-12-12 ENCOUNTER — Ambulatory Visit: Payer: PRIVATE HEALTH INSURANCE | Admitting: Professional

## 2022-12-12 DIAGNOSIS — F331 Major depressive disorder, recurrent, moderate: Secondary | ICD-10-CM | POA: Diagnosis not present

## 2022-12-12 DIAGNOSIS — F988 Other specified behavioral and emotional disorders with onset usually occurring in childhood and adolescence: Secondary | ICD-10-CM

## 2022-12-12 DIAGNOSIS — F411 Generalized anxiety disorder: Secondary | ICD-10-CM

## 2022-12-12 NOTE — Progress Notes (Signed)
[facility] Counselor/Therapist Progress Note  Patient ID: Maureen [name], MRN: [id number],    Date: 12/12/2022  Time Spent:  58 minutes 901-959am  Treatment Type: Individual Therapy  Risk Assessment: Danger to Self:  No Self-injurious Behavior: No Danger to Others: No  Subjective: This session was held via video teletherapy. The patient consented to video teletherapy and was located in her office during this session. She is aware it is the responsibility of the patient to secure confidentiality on her end of the session. The provider was in a private home office for the duration of this session.   The patient arrived on time for her Caregility appointment appointment.  Issues addressed: 1-personal a-granddaughter Paisley born last Tuesday -she is very excited a-she is off this week b-the holidays are hard for her c-she got the journal suggested by this counselor and plans to work on this week d-mother -got a cat and it appears it has taken over -her dad called to express his frustration that her mom has covered all the furniture with plastic d-Thanksgiving will be with her MIL and her children -discussed the importance of structuring her time 2-continue EMDR -educated patient on steps, trauma, how trauma manifests -began gathering "Timeline of Events and Resiliency Factors"  Treatment Plan Problems Addressed  Anxiety, Depression Goals 1. Engage in healthy reciprocal dating relationship 2. Enhance ability to effectively cope with the full variety of life's worries and anxieties. Objective Complete a Cost Benefit Analysis of maintaining the anxiety. Target Date: 2023-02-15 Frequency: Biweekly  Progress: 0 Modality: individual  Related Interventions Ask the client to evaluate the costs and benefits of worries (e.g., complete the Cost Benefit Analysis exercise in Ten Days to Self-Esteem! by Lawerance Bach) in which he/she lists the advantages and disadvantages of the  negative thought, fear, or anxiety; process the completed assignment. Objective Describe situations, thoughts, feelings, and actions associated with anxieties and worries, their impact on functioning, and attempts to resolve them. Target Date: 2023-02-15 Frequency: Biweekly  Progress: 0 Modality: individual  Related Interventions Focus on developing a level of trust with the client; provide support and empathy to encourage the client to feel safe in expressing his/her GAD symptoms. Ask the client to describe his/her past experiences of anxiety and their impact on functioning; assess the focus, excessiveness, and uncontrollability of the worry and the type, frequency, intensity, and duration of his/her anxiety symptoms (consider using a structured interview such as The Anxiety Disorders Interview Schedule-Adult Version). Objective Identify the major life conflicts from the past and present that form the basis for present anxiety. Target Date: 2023-02-15 Frequency: Biweekly  Progress: 0 Modality: individual  Related Interventions Ask the client to develop and process a list of key past and present life conflicts that continue to cause worry. Reinforce the client's insights into the role of his/her past emotional pain and present anxiety. Assist the client in becoming aware of key unresolved life conflicts and in starting to work toward their resolution. Objective Actively evaluate dating relationships to ensure honest evaluation of healthy vs unheathy behaviors. Target Date: 2023-02-15 Frequency: Biweekly  Progress: 0 Modality: individual  Related Interventions Assist pt in evaluating values-based dating relationships so that patient achieves her desires for a lifelong partner. 3. Identify and increase intrapersonal, interpersonal, and physical resources to foster positive coping strategies. 4. Learn and implement coping skills that result in a reduction of anxiety and worry, and improved daily  functioning. 5. Resolve the core conflict that is the source of anxiety. Objective Maintain  involvement in work, family, and social activities. Target Date: 2023-02-15 Frequency: Biweekly  Progress: 0 Modality: individual  Related Interventions Support the client in following through with work, family, and social activities rather than escaping or avoiding them to focus on anxiety.  Diagnosis:Major depressive disorder, recurrent episode, moderate (HCC)  Generalized anxiety disorder  Adult attention deficit disorder  Plan:  -structure schedule to include social time over the next week -think about Susanne Borders and positive influence she was on you -meet again on Tuesday, December 27, 2022 at [facility].

## 2022-12-27 ENCOUNTER — Encounter: Payer: Self-pay | Admitting: Professional

## 2022-12-27 ENCOUNTER — Ambulatory Visit: Payer: PRIVATE HEALTH INSURANCE | Admitting: Professional

## 2022-12-27 DIAGNOSIS — F411 Generalized anxiety disorder: Secondary | ICD-10-CM | POA: Diagnosis not present

## 2022-12-27 DIAGNOSIS — F4310 Post-traumatic stress disorder, unspecified: Secondary | ICD-10-CM | POA: Diagnosis not present

## 2022-12-27 DIAGNOSIS — F988 Other specified behavioral and emotional disorders with onset usually occurring in childhood and adolescence: Secondary | ICD-10-CM | POA: Diagnosis not present

## 2022-12-27 DIAGNOSIS — F331 Major depressive disorder, recurrent, moderate: Secondary | ICD-10-CM | POA: Diagnosis not present

## 2022-12-27 NOTE — Progress Notes (Signed)
[facility] Counselor/Therapist Progress Note  Patient ID: Maureen [name], MRN: [id number],    Date: 12/27/2022  Time Spent:  41 minutes 902-943am  Treatment Type: Individual Therapy  Risk Assessment: Danger to Self:  No Self-injurious Behavior: No Danger to Others: No  Subjective: This session was held via video teletherapy. The patient consented to video teletherapy and was located in her office during this session. She is aware it is the responsibility of the patient to secure confidentiality on her end of the session. The provider was in a private home office for the duration of this session.   The patient arrived on time for her Caregility appointment appointment.  Issues addressed: 1-homework- completed -structure schedule to include social time over the next week   -worked on structuring weeks coming up but has not done due to spending so much time with granddaughter   -evaluated plan for the holiday season     -plans to join [employer] next Thursday     -over break will go to [employer] in morning     -visit Marilynne Halsted     -spend some time in [employer] -think about [employer] and positive influence she was on you personal a-granddaughter Marilynne Halsted born last Tuesday -granddaughter had cold but is doing well 2-dating -she is going out on dates -she has had two dates with two different guys -Tremaine she will go out for second time this weekend   -he chose theater and she will choose movie   -he was a perfect gentleman   -he holds doors, helps put on coat, ordered what she wanted    -great conversation   -Cabin crew ten years   -he is originally from [place]   -he is here to take care of his mother but does not live with her -Francee Piccolo went last week for dinner   -he came in hard and heavy   -he does a lot of talking but his actions don't match   -he has not been in contact in the past several days   -has own Holiday representative business   -he works with [employer] school system  working on the buses -pt feel good about her strategy of watching the men and not just listen to them -children know where she is going  3-different season -pt feels something is missing -she admits God makes her whole -she feels the need to have a person in her life -"it feels like there's a gap" -"God has given me such good women in my life" -she has increased her contact with her [place] bff and is getting the warmth -she was so attached to her kids and she no longer gets the daily hugs and kisses -pt is having to learn to cope with the "in between" days when she feels lonely, not alone, but lonely -when she wasn't dating at all she didn't miss it -she now has experienced how nice it feels to have her hand held or to be hugged 4-planning for future -beginning school at [employer] to get a Regulatory affairs officer -she has applied at the [employer] near [place] -she is looking for somewhere close to home and not as demanding of her time  Treatment Plan Problems Addressed  Anxiety, Depression Goals 1. Engage in healthy reciprocal dating relationship 2. Enhance ability to effectively cope with the full variety of life's worries and anxieties. Objective Complete a Cost Benefit Analysis of maintaining the anxiety. Target Date: 2023-02-15 Frequency: Biweekly  Progress: 0 Modality: individual  Related Interventions Ask the client to evaluate the costs and benefits of worries (e.g., complete the Cost Benefit Analysis exercise in Ten Days to Self-Esteem! by Lawerance Bach) in which he/she lists the advantages and disadvantages of the negative thought, fear, or anxiety; process the completed assignment. Objective Describe situations, thoughts, feelings, and actions associated with anxieties and worries, their impact on functioning, and attempts to resolve them. Target Date: 2023-02-15 Frequency: Biweekly  Progress: 0 Modality: individual  Related Interventions Focus on developing a  level of trust with the client; provide support and empathy to encourage the client to feel safe in expressing his/her GAD symptoms. Ask the client to describe his/her past experiences of anxiety and their impact on functioning; assess the focus, excessiveness, and uncontrollability of the worry and the type, frequency, intensity, and duration of his/her anxiety symptoms (consider using a structured interview such as The Anxiety Disorders Interview Schedule-Adult Version). Objective Identify the major life conflicts from the past and present that form the basis for present anxiety. Target Date: 2023-02-15 Frequency: Biweekly  Progress: 0 Modality: individual  Related Interventions Ask the client to develop and process a list of key past and present life conflicts that continue to cause worry. Reinforce the client's insights into the role of his/her past emotional pain and present anxiety. Assist the client in becoming aware of key unresolved life conflicts and in starting to work toward their resolution. Objective Actively evaluate dating relationships to ensure honest evaluation of healthy vs unheathy behaviors. Target Date: 2023-02-15 Frequency: Biweekly  Progress: 0 Modality: individual  Related Interventions Assist pt in evaluating values-based dating relationships so that patient achieves her desires for a lifelong partner. 3. Identify and increase intrapersonal, interpersonal, and physical resources to foster positive coping strategies. 4. Learn and implement coping skills that result in a reduction of anxiety and worry, and improved daily functioning. 5. Resolve the core conflict that is the source of anxiety. Objective Maintain involvement in work, family, and social activities. Target Date: 2023-02-15 Frequency: Biweekly  Progress: 0 Modality: individual  Related Interventions Support the client in following through with work, family, and social activities rather than escaping or  avoiding them to focus on anxiety.  Diagnosis:Major depressive disorder, recurrent episode, moderate (HCC)  Generalized anxiety disorder  Adult attention deficit disorder  PTSD (post-traumatic stress disorder)  Plan:   -meet again on Tuesday, January 23, 2022 at 10am.

## 2023-01-09 ENCOUNTER — Ambulatory Visit: Payer: PRIVATE HEALTH INSURANCE | Admitting: Professional

## 2023-01-24 ENCOUNTER — Ambulatory Visit: Payer: PRIVATE HEALTH INSURANCE | Admitting: Professional

## 2023-01-24 ENCOUNTER — Encounter: Payer: Self-pay | Admitting: Professional

## 2023-01-24 DIAGNOSIS — F4310 Post-traumatic stress disorder, unspecified: Secondary | ICD-10-CM

## 2023-01-24 DIAGNOSIS — F988 Other specified behavioral and emotional disorders with onset usually occurring in childhood and adolescence: Secondary | ICD-10-CM

## 2023-01-24 DIAGNOSIS — F331 Major depressive disorder, recurrent, moderate: Secondary | ICD-10-CM

## 2023-01-24 DIAGNOSIS — F411 Generalized anxiety disorder: Secondary | ICD-10-CM

## 2023-01-24 DIAGNOSIS — F909 Attention-deficit hyperactivity disorder, unspecified type: Secondary | ICD-10-CM | POA: Diagnosis not present

## 2023-01-24 NOTE — Progress Notes (Signed)
 [facility] Counselor/Therapist Progress Note  Patient ID: Maureen [name], MRN: [id number],    Date: 01/24/2023  Time Spent:  42 minutes 1006-1048am  Treatment Type: Individual Therapy  Risk Assessment: Danger to Self:  No Self-injurious Behavior: No Danger to Others: No  Subjective: This session was held via video teletherapy. The patient consented to video teletherapy and was located in her office during this session. She is aware it is the responsibility of the patient to secure confidentiality on her end of the session. The provider was in a private home office for the duration of this session.   The patient arrived late for her Caregility appointment appointment.  Issues addressed: 1-homework- completed -she reconnected with cousin in law Maureen [name] -he spontaneously provided her with family history 2-family history -another cousin spoke with her about what happened in her childhood   -her mother's bf Maureen [name] was sent to prison for tax evasion   -she wonders if her mother was in prison has well -she and her brother were shifted around among family members -they lived with Maureen [name] who told father that if he did not contribute to their care he would never see them again -pt learned that her stepmother Maureen [name] told pt's dad that he needed to go get his children because they needed a stable environment -pt feels more complete and is enjoying learning about her family -she does want to learn more about why she and her brother were not with their mother 3-holidays -were wonderful with her first grandchild -her daughter has appreciated her help and has shared a new appreciation for all her mother did for her and her siblings  Treatment Plan Problems Addressed  Anxiety, Depression Goals 1. Engage in healthy reciprocal dating relationship 2. Enhance ability to effectively cope with the full variety of life's worries and anxieties. Objective Complete a Cost Benefit Analysis  of maintaining the anxiety. Target Date: 2023-02-15 Frequency: Biweekly  Progress: 0 Modality: individual  Related Interventions Ask the client to evaluate the costs and benefits of worries (e.g., complete the Cost Benefit Analysis exercise in Ten Days to Self-Esteem! by Geofm) in which he/she lists the advantages and disadvantages of the negative thought, fear, or anxiety; process the completed assignment. Objective Describe situations, thoughts, feelings, and actions associated with anxieties and worries, their impact on functioning, and attempts to resolve them. Target Date: 2023-02-15 Frequency: Biweekly  Progress: 0 Modality: individual  Related Interventions Focus on developing a level of trust with the client; provide support and empathy to encourage the client to feel safe in expressing his/her GAD symptoms. Ask the client to describe his/her past experiences of anxiety and their impact on functioning; assess the focus, excessiveness, and uncontrollability of the worry and the type, frequency, intensity, and duration of his/her anxiety symptoms (consider using a structured interview such as The Anxiety Disorders Interview Schedule-Adult Version). Objective Identify the major life conflicts from the past and present that form the basis for present anxiety. Target Date: 2023-02-15 Frequency: Biweekly  Progress: 0 Modality: individual  Related Interventions Ask the client to develop and process a list of key past and present life conflicts that continue to cause worry. Reinforce the client's insights into the role of his/her past emotional pain and present anxiety. Assist the client in becoming aware of key unresolved life conflicts and in starting to work toward their resolution. Objective Actively evaluate dating relationships to ensure honest evaluation of healthy vs unheathy behaviors. Target Date: 2023-02-15 Frequency: Biweekly  Progress: 0 Modality:  individual  Related  Interventions Assist pt in evaluating values-based dating relationships so that patient achieves her desires for a lifelong partner. 3. Identify and increase intrapersonal, interpersonal, and physical resources to foster positive coping strategies. 4. Learn and implement coping skills that result in a reduction of anxiety and worry, and improved daily functioning. 5. Resolve the core conflict that is the source of anxiety. Objective Maintain involvement in work, family, and social activities. Target Date: 2023-02-15 Frequency: Biweekly  Progress: 0 Modality: individual  Related Interventions Support the client in following through with work, family, and social activities rather than escaping or avoiding them to focus on anxiety.  Diagnosis:Major depressive disorder, recurrent episode, moderate (HCC)  Generalized anxiety disorder  Adult attention deficit disorder  PTSD (post-traumatic stress disorder)  Plan:  -meet again on Tuesday, February 07, 2023 at 10am.

## 2023-02-01 ENCOUNTER — Ambulatory Visit: Payer: PRIVATE HEALTH INSURANCE | Admitting: Professional

## 2023-02-07 ENCOUNTER — Encounter: Payer: Self-pay | Admitting: Professional

## 2023-02-07 ENCOUNTER — Ambulatory Visit: Payer: PRIVATE HEALTH INSURANCE | Admitting: Professional

## 2023-02-07 DIAGNOSIS — F331 Major depressive disorder, recurrent, moderate: Secondary | ICD-10-CM | POA: Diagnosis not present

## 2023-02-07 DIAGNOSIS — F4310 Post-traumatic stress disorder, unspecified: Secondary | ICD-10-CM

## 2023-02-07 DIAGNOSIS — F988 Other specified behavioral and emotional disorders with onset usually occurring in childhood and adolescence: Secondary | ICD-10-CM | POA: Diagnosis not present

## 2023-02-07 DIAGNOSIS — F411 Generalized anxiety disorder: Secondary | ICD-10-CM | POA: Diagnosis not present

## 2023-02-07 NOTE — Progress Notes (Signed)
[facility] Counselor/Therapist Progress Note  Patient ID: Maureen [name], MRN: [id number],    Date: 02/07/2023  Time Spent:  40 minutes 1006-1046am  Treatment Type: Individual Therapy  Risk Assessment: Danger to Self:  No Self-injurious Behavior: No Danger to Others: No  Subjective: This session was held via video teletherapy. The patient consented to video teletherapy and was located in her office during this session. She is aware it is the responsibility of the patient to secure confidentiality on her end of the session. The provider was in a private home office for the duration of this session.   The patient arrived late for her Caregility appointment appointment.  Issues addressed: 1-personal -doing well -is enjoying her new granddaughter Marilynne Halsted and spending time with her -has been supportive of her daughter as she learns how to parent -her daughter has been very demonstrative -her step mother Mimi came and spent some time with her   -she really enjoyed having her   -she thinks about if this will be the last time she will see her mom -still grieving   -not allowing herself to live in the grief -safe people   -getting enough safe people around her -Aunt Helen's son Andrey Campanile   -sent our a birthday wish to his mom online   -her son's BD is today    Treatment Plan Problems Addressed  Anxiety, Depression Goals 1. Engage in healthy reciprocal dating relationship 2. Enhance ability to effectively cope with the full variety of life's worries and anxieties. Objective Complete a Cost Benefit Analysis of maintaining the anxiety. Target Date: 2023-02-15 Frequency: Biweekly  Progress: 0 Modality: individual  Related Interventions Ask the client to evaluate the costs and benefits of worries (e.g., complete the Cost Benefit Analysis exercise in Ten Days to Self-Esteem! by Lawerance Bach) in which he/she lists the advantages and disadvantages of the negative thought, fear,  or anxiety; process the completed assignment. Objective Describe situations, thoughts, feelings, and actions associated with anxieties and worries, their impact on functioning, and attempts to resolve them. Target Date: 2023-02-15 Frequency: Biweekly  Progress: 0 Modality: individual  Related Interventions Focus on developing a level of trust with the client; provide support and empathy to encourage the client to feel safe in expressing his/her GAD symptoms. Ask the client to describe his/her past experiences of anxiety and their impact on functioning; assess the focus, excessiveness, and uncontrollability of the worry and the type, frequency, intensity, and duration of his/her anxiety symptoms (consider using a structured interview such as The Anxiety Disorders Interview Schedule-Adult Version). Objective Identify the major life conflicts from the past and present that form the basis for present anxiety. Target Date: 2023-02-15 Frequency: Biweekly  Progress: 0 Modality: individual  Related Interventions Ask the client to develop and process a list of key past and present life conflicts that continue to cause worry. Reinforce the client's insights into the role of his/her past emotional pain and present anxiety. Assist the client in becoming aware of key unresolved life conflicts and in starting to work toward their resolution. Objective Actively evaluate dating relationships to ensure honest evaluation of healthy vs unheathy behaviors. Target Date: 2023-02-15 Frequency: Biweekly  Progress: 0 Modality: individual  Related Interventions Assist pt in evaluating values-based dating relationships so that patient achieves her desires for a lifelong partner. 3. Identify and increase intrapersonal, interpersonal, and physical resources to foster positive coping strategies. 4. Learn and implement coping skills that result in a reduction of anxiety and worry, and improved daily functioning.  5. Resolve  the core conflict that is the source of anxiety. Objective Maintain involvement in work, family, and social activities. Target Date: 2023-02-15 Frequency: Biweekly  Progress: 0 Modality: individual  Related Interventions Support the client in following through with work, family, and social activities rather than escaping or avoiding them to focus on anxiety.  Diagnosis:Major depressive disorder, recurrent episode, moderate (HCC)  Generalized anxiety disorder  Adult attention deficit disorder  PTSD (post-traumatic stress disorder)  Plan:  -meet again on Tuesday, February 21, 2023 at 10am.

## 2023-02-21 ENCOUNTER — Encounter: Payer: Self-pay | Admitting: Professional

## 2023-02-21 ENCOUNTER — Ambulatory Visit: Payer: PRIVATE HEALTH INSURANCE | Admitting: Professional

## 2023-02-21 DIAGNOSIS — F988 Other specified behavioral and emotional disorders with onset usually occurring in childhood and adolescence: Secondary | ICD-10-CM | POA: Diagnosis not present

## 2023-02-21 DIAGNOSIS — F4310 Post-traumatic stress disorder, unspecified: Secondary | ICD-10-CM | POA: Diagnosis not present

## 2023-02-21 DIAGNOSIS — F411 Generalized anxiety disorder: Secondary | ICD-10-CM

## 2023-02-21 DIAGNOSIS — F331 Major depressive disorder, recurrent, moderate: Secondary | ICD-10-CM

## 2023-02-21 NOTE — Progress Notes (Addendum)
 [facility] Counselor/Therapist Progress Note  Patient ID: Maureen [name], MRN: [id number],    Date: 02/21/2023  Time Spent:  45 minutes 1005-1050am  Treatment Type: Individual Therapy  Risk Assessment: Danger to Self:  No Self-injurious Behavior: No Danger to Others: No  Subjective: This session was held via video teletherapy. The patient consented to video teletherapy and was located in her office during this session. She is aware it is the responsibility of the patient to secure confidentiality on her end of the session. The provider was in a private home office for the duration of this session.   The patient arrived late for her Caregility appointment appointment.  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 Engage in healthy reciprocal dating relationship 2. Enhance ability to effectively cope with the full variety of life's worries and anxieties. Objective Complete a Cost Benefit Analysis of maintaining the anxiety. Target Date: 2024-02-21 Frequency: Biweekly  Progress: 0 Modality: individual  Related Interventions Ask the client to evaluate the costs and benefits of worries (e.g., complete the Cost Benefit Analysis exercise in Ten Days to Self-Esteem! by Geofm) in which he/she lists the advantages and disadvantages of the negative thought, fear, or anxiety; process the completed  assignment. Objective Describe situations, thoughts, feelings, and actions associated with anxieties and worries, their impact on functioning, and attempts to resolve them. Target Date: 2024-02-21 Frequency: Biweekly  Progress: 50 Modality: individual  Related Interventions Focus on developing a level of trust with the client; provide support and empathy to encourage the client to feel safe in expressing his/her GAD symptoms. Ask the client to describe his/her past experiences of anxiety and their impact on functioning; assess the focus, excessiveness, and uncontrollability of the worry and the type, frequency, intensity, and duration of his/her anxiety symptoms (consider using a structured interview such as The Anxiety Disorders Interview Schedule-Adult Version). Objective Identify the major life conflicts from the past and present that form the basis for present anxiety. Target Date: 2024-02-21 Frequency: Biweekly  Progress: 80 Modality: individual  Related Interventions Ask the client to develop and process a list of key past and present life conflicts that continue to cause worry. Reinforce the client's insights into the role of his/her past emotional pain and present anxiety. Assist the client in becoming aware of key unresolved life conflicts and in starting to work toward their resolution. Objective Actively evaluate dating relationships to ensure honest evaluation of healthy vs unheathy behaviors. Target Date: 2024-02-21 Frequency: Biweekly  Progress: 40 Modality: individual  Related Interventions Assist pt in evaluating values-based dating relationships so that patient achieves her desires for a lifelong partner. 3. Identify and increase intrapersonal, interpersonal, and physical resources to foster positive coping strategies. 4. Learn and implement coping skills that result in a reduction of anxiety and worry, and improved daily functioning. 5. Resolve the core conflict that is the  source of anxiety. Objective Maintain involvement in work, family, and social activities. Target Date: 2024-02-21 Frequency: Biweekly  Progress: 40 Modality: individual  Related Interventions Support the client in following through with work, family, and social activities rather than escaping or avoiding them to focus on anxiety.  Diagnosis:Major depressive disorder, recurrent episode, moderate (HCC)  Generalized anxiety disorder  Adult attention deficit disorder  PTSD (post-traumatic stress disorder)  Plan:  -meet again on Tuesday, March 07, 2023 at 10am.

## 2023-03-07 ENCOUNTER — Ambulatory Visit: Payer: PRIVATE HEALTH INSURANCE | Admitting: Professional

## 2023-03-07 DIAGNOSIS — F331 Major depressive disorder, recurrent, moderate: Secondary | ICD-10-CM

## 2023-03-09 NOTE — Progress Notes (Signed)
 This encounter was created in error - please disregard.

## 2023-03-21 ENCOUNTER — Ambulatory Visit: Payer: PRIVATE HEALTH INSURANCE | Admitting: Professional

## 2023-04-04 ENCOUNTER — Ambulatory Visit: Payer: PRIVATE HEALTH INSURANCE | Admitting: Professional

## 2023-04-18 ENCOUNTER — Ambulatory Visit: Payer: PRIVATE HEALTH INSURANCE | Admitting: Professional

## 2023-05-16 ENCOUNTER — Ambulatory Visit: Payer: PRIVATE HEALTH INSURANCE | Admitting: Professional

## 2023-06-13 ENCOUNTER — Ambulatory Visit: Payer: PRIVATE HEALTH INSURANCE | Admitting: Professional

## 2023-07-11 ENCOUNTER — Ambulatory Visit: Payer: PRIVATE HEALTH INSURANCE | Admitting: Professional

## 2023-07-18 ENCOUNTER — Ambulatory Visit (INDEPENDENT_AMBULATORY_CARE_PROVIDER_SITE_OTHER): Payer: PRIVATE HEALTH INSURANCE | Admitting: Professional

## 2023-07-18 ENCOUNTER — Encounter: Payer: Self-pay | Admitting: Professional

## 2023-07-18 DIAGNOSIS — F988 Other specified behavioral and emotional disorders with onset usually occurring in childhood and adolescence: Secondary | ICD-10-CM | POA: Diagnosis not present

## 2023-07-18 DIAGNOSIS — F331 Major depressive disorder, recurrent, moderate: Secondary | ICD-10-CM

## 2023-07-18 DIAGNOSIS — F411 Generalized anxiety disorder: Secondary | ICD-10-CM

## 2023-07-18 DIAGNOSIS — F4310 Post-traumatic stress disorder, unspecified: Secondary | ICD-10-CM
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
## 2023-08-08 ENCOUNTER — Ambulatory Visit (INDEPENDENT_AMBULATORY_CARE_PROVIDER_SITE_OTHER): Admitting: Professional

## 2023-08-08 ENCOUNTER — Encounter: Payer: Self-pay | Admitting: Professional

## 2023-08-08 DIAGNOSIS — F331 Major depressive disorder, recurrent, moderate: Secondary | ICD-10-CM | POA: Diagnosis not present

## 2023-08-08 DIAGNOSIS — F4310 Post-traumatic stress disorder, unspecified: Secondary | ICD-10-CM

## 2023-08-08 DIAGNOSIS — F411 Generalized anxiety disorder: Secondary | ICD-10-CM

## 2023-08-08 DIAGNOSIS — F988 Other specified behavioral and emotional disorders with onset usually occurring in childhood and adolescence: Secondary | ICD-10-CM | POA: Diagnosis not present

## 2023-08-08 NOTE — Progress Notes (Signed)
 [facility] Counselor/Therapist Progress Note  Patient ID: Maureen [name], MRN: [id number],    Date: 08/08/2023  Time Spent:  38 minutes 1102-1140am  Treatment Type: Individual Therapy  Risk Assessment: Danger to Self:  No Self-injurious Behavior: No Danger to Others: No  Subjective: This session was held via video teletherapy. The patient consented to video teletherapy and was located in her car during this session. She is aware it is the responsibility of the patient to secure confidentiality on her end of the session. The provider was in a private home office for the duration of this session.   The patient arrived late for her Caregility appointment appointment.  Issues addressed: 1-professional -supervisor gave her notice and left her job -they are now hiring a new Librarian, academic -pt worked very well together with her boss -her boss thoroughly taught her how to do the job and she feels confident -pt feels she is on a cliff and she doesn't have someone there to say she's got my harness -pt is in anxiety mode and has added to it 2-relationships a-God has been teaching her of the need to hold her children accountable -her son taking over the house she was doing it for him and not for herself -her dog is not agreeable for apartment living and she is now trying to make him an emotional support animal -she spent yesterday crying the entire way home from work -pt did not evaluate the entire process that impacts her ability to take her dog Vila -helping her kids has put her behind financially -in the past two years she has done a lot to improve her financial picture 3-activity -joining a swim class beginning in August -[facility] has kept her busy doing things in the community -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 -she is in two online bible studies (Tuesday and 2 Wednesdays per month) -she continues to do her  [employer] and Body the salt water float   -it is so relaxing  -your mind and body  -it was a year in February -girls trip in October  4-mood -pt feels so heavy with everything going on in the world -feels the sadness of things that are happening but she denies feeling depressed -she does have some anxiety related to work and personal changes  Treatment Plan Problems Addressed  Anxiety, Depression Goals 1. Engage in healthy reciprocal dating relationship 2. Enhance ability to effectively cope with the full variety of life's worries and anxieties. Objective Complete a Cost Benefit Analysis of maintaining the anxiety. Target Date: 2024-02-21 Frequency: Biweekly  Progress: 0 Modality: individual  Related Interventions Ask the client to evaluate the costs and benefits of worries (e.g., complete the Cost Benefit Analysis exercise in Ten Days to Self-Esteem! by Geofm) in which he/she lists the advantages and disadvantages of the negative thought, fear, or anxiety; process the completed assignment. Objective Describe situations, thoughts, feelings, and actions associated with anxieties and worries, their impact on functioning, and attempts to resolve them. Target Date: 2024-02-21 Frequency: Biweekly  Progress: 50 Modality: individual  Related Interventions Focus on developing a level of trust with the client; provide support and empathy to encourage the client to feel safe in expressing his/her GAD symptoms. Ask the client to describe his/her past experiences of anxiety and their impact on functioning; assess the focus, excessiveness, and uncontrollability of the worry and the type, frequency, intensity, and duration of his/her anxiety symptoms (consider using a  structured interview such as The Anxiety Disorders Interview Schedule-Adult Version). Objective Identify the major life conflicts from the past and present that form the basis for present anxiety. Target Date: 2024-02-21  Frequency: Biweekly  Progress: 80 Modality: individual  Related Interventions Ask the client to develop and process a list of key past and present life conflicts that continue to cause worry. Reinforce the client's insights into the role of his/her past emotional pain and present anxiety. Assist the client in becoming aware of key unresolved life conflicts and in starting to work toward their resolution. Objective Actively evaluate dating relationships to ensure honest evaluation of healthy vs unheathy behaviors. Target Date: 2024-02-21 Frequency: Biweekly  Progress: 40 Modality: individual  Related Interventions Assist pt in evaluating values-based dating relationships so that patient achieves her desires for a lifelong partner. 3. Identify and increase intrapersonal, interpersonal, and physical resources to foster positive coping strategies. 4. Learn and implement coping skills that result in a reduction of anxiety and worry, and improved daily functioning. 5. Resolve the core conflict that is the source of anxiety. Objective Maintain involvement in work, family, and social activities. Target Date: 2024-02-21 Frequency: Biweekly  Progress: 40 Modality: individual  Related Interventions Support the client in following through with work, family, and social activities rather than escaping or avoiding them to focus on anxiety.  Diagnosis:Major depressive disorder, recurrent episode, moderate (HCC)  Generalized anxiety disorder  Adult attention deficit disorder  PTSD (post-traumatic stress disorder)  Plan:  -meet again on Wednesday, August 30, 2023 at 11am.

## 2023-08-28 ENCOUNTER — Encounter: Payer: Self-pay | Admitting: Internal Medicine

## 2023-08-28 ENCOUNTER — Other Ambulatory Visit: Payer: Self-pay | Admitting: Internal Medicine

## 2023-08-28 ENCOUNTER — Ambulatory Visit
Admission: RE | Admit: 2023-08-28 | Discharge: 2023-08-28 | Disposition: A | Discharge status: Home / Self Care | Source: Ambulatory Visit | Attending: Internal Medicine | Admitting: Internal Medicine

## 2023-08-28 DIAGNOSIS — M5451 Vertebrogenic low back pain: Secondary | ICD-10-CM

## 2023-08-30 ENCOUNTER — Ambulatory Visit: Admitting: Professional

## 2023-09-19 ENCOUNTER — Observation Stay (HOSPITAL_COMMUNITY)
Admission: EM | Admit: 2023-09-19 | Discharge: 2023-09-20 | Disposition: A | Discharge status: Home / Self Care | Attending: Internal Medicine | Admitting: Internal Medicine

## 2023-09-19 ENCOUNTER — Other Ambulatory Visit: Payer: Self-pay

## 2023-09-19 ENCOUNTER — Emergency Department (HOSPITAL_COMMUNITY)

## 2023-09-19 DIAGNOSIS — I10 Essential (primary) hypertension: Secondary | ICD-10-CM | POA: Insufficient documentation

## 2023-09-19 DIAGNOSIS — N959 Unspecified menopausal and perimenopausal disorder: Secondary | ICD-10-CM | POA: Diagnosis not present

## 2023-09-19 DIAGNOSIS — N951 Menopausal and female climacteric states: Secondary | ICD-10-CM | POA: Insufficient documentation

## 2023-09-19 DIAGNOSIS — W19XXXA Unspecified fall, initial encounter: Secondary | ICD-10-CM | POA: Insufficient documentation

## 2023-09-19 DIAGNOSIS — E039 Hypothyroidism, unspecified: Secondary | ICD-10-CM | POA: Diagnosis not present

## 2023-09-19 DIAGNOSIS — R29898 Other symptoms and signs involving the musculoskeletal system: Secondary | ICD-10-CM | POA: Diagnosis not present

## 2023-09-19 DIAGNOSIS — S0990XA Unspecified injury of head, initial encounter: Principal | ICD-10-CM | POA: Insufficient documentation

## 2023-09-19 DIAGNOSIS — S060X1A Concussion with loss of consciousness of 30 minutes or less, initial encounter: Secondary | ICD-10-CM

## 2023-09-19 DIAGNOSIS — Z8639 Personal history of other endocrine, nutritional and metabolic disease: Secondary | ICD-10-CM

## 2023-09-19 DIAGNOSIS — G8191 Hemiplegia, unspecified affecting right dominant side: Secondary | ICD-10-CM | POA: Insufficient documentation

## 2023-09-19 DIAGNOSIS — Z79899 Other long term (current) drug therapy: Secondary | ICD-10-CM | POA: Insufficient documentation

## 2023-09-19 DIAGNOSIS — R531 Weakness: Secondary | ICD-10-CM

## 2023-09-19 DIAGNOSIS — S0003XA Contusion of scalp, initial encounter: Secondary | ICD-10-CM | POA: Diagnosis not present

## 2023-09-19 DIAGNOSIS — F411 Generalized anxiety disorder: Secondary | ICD-10-CM | POA: Diagnosis not present

## 2023-09-19 LAB — CBC WITH DIFFERENTIAL/PLATELET
Abs Immature Granulocytes: 0.03 K/uL (ref 0.00–0.07)
Basophils Absolute: 0.1 K/uL (ref 0.0–0.1)
Basophils Relative: 0 %
Eosinophils Absolute: 0.5 K/uL (ref 0.0–0.5)
Eosinophils Relative: 4 %
HCT: 42.8 % (ref 36.0–46.0)
Hemoglobin: 13.2 g/dL (ref 12.0–15.0)
Immature Granulocytes: 0 %
Lymphocytes Relative: 31 %
Lymphs Abs: 3.5 K/uL (ref 0.7–4.0)
MCH: 26.3 pg (ref 26.0–34.0)
MCHC: 30.8 g/dL (ref 30.0–36.0)
MCV: 85.3 fL (ref 80.0–100.0)
Monocytes Absolute: 0.9 K/uL (ref 0.1–1.0)
Monocytes Relative: 8 %
Neutro Abs: 6.2 K/uL (ref 1.7–7.7)
Neutrophils Relative %: 57 %
Platelets: 272 K/uL (ref 150–400)
RBC: 5.02 MIL/uL (ref 3.87–5.11)
RDW: 14.8 % (ref 11.5–15.5)
WBC: 11.3 K/uL — ABNORMAL HIGH (ref 4.0–10.5)
nRBC: 0 % (ref 0.0–0.2)

## 2023-09-19 LAB — COMPREHENSIVE METABOLIC PANEL WITH GFR
ALT: 17 U/L (ref 0–44)
AST: 26 U/L (ref 15–41)
Albumin: 4.6 g/dL (ref 3.5–5.0)
Alkaline Phosphatase: 91 U/L (ref 38–126)
Anion gap: 13 (ref 5–15)
BUN: 16 mg/dL (ref 6–20)
CO2: 23 mmol/L (ref 22–32)
Calcium: 9.5 mg/dL (ref 8.9–10.3)
Chloride: 100 mmol/L (ref 98–111)
Creatinine, Ser: 0.82 mg/dL (ref 0.44–1.00)
GFR, Estimated: >60 mL/min (ref >60)
Glucose, Bld: 84 mg/dL (ref 70–99)
Potassium: 3.8 mmol/L (ref 3.5–5.1)
Sodium: 135 mmol/L (ref 135–145)
Total Bilirubin: 0.7 mg/dL (ref 0.0–1.2)
Total Protein: 7.9 g/dL (ref 6.5–8.1)

## 2023-09-19 LAB — I-STAT CHEM 8, ED
BUN: 15 mg/dL (ref 6–20)
Calcium, Ion: 1.14 mmol/L — ABNORMAL LOW (ref 1.15–1.40)
Chloride: 101 mmol/L (ref 98–111)
Creatinine, Ser: 0.8 mg/dL (ref 0.44–1.00)
Glucose, Bld: 111 mg/dL — ABNORMAL HIGH (ref 70–99)
HCT: 43 % (ref 36.0–46.0)
Hemoglobin: 14.6 g/dL (ref 12.0–15.0)
Potassium: 3.8 mmol/L (ref 3.5–5.1)
Sodium: 135 mmol/L (ref 135–145)
TCO2: 25 mmol/L (ref 22–32)

## 2023-09-19 LAB — ETHANOL: Alcohol, Ethyl (B): <15 mg/dL (ref <15)

## 2023-09-19 NOTE — ED Triage Notes (Signed)
 Pt fell 20 minutes PTA, hit head on concrete. Pt was pulled by a dog while walking it. No LOC, no blood thinners. Large hematoma to left eyebrow. Pt is confused and states she does not know her daughters who are with her, unable to tell me her name

## 2023-09-19 NOTE — ED Notes (Signed)
 Pt placed in C-Collar

## 2023-09-19 NOTE — ED Notes (Addendum)
 Dr. Ginger notified of pt head injury and AMS

## 2023-09-19 NOTE — ED Notes (Signed)
 Patient put on bedpan. Patient voided. Patient still confused and does not know who her daughters are. Can not tell me their names. JRPRN

## 2023-09-20 ENCOUNTER — Other Ambulatory Visit (HOSPITAL_COMMUNITY): Payer: Self-pay

## 2023-09-20 ENCOUNTER — Inpatient Hospital Stay (HOSPITAL_COMMUNITY)

## 2023-09-20 ENCOUNTER — Encounter (HOSPITAL_COMMUNITY): Payer: Self-pay | Admitting: Internal Medicine

## 2023-09-20 ENCOUNTER — Ambulatory Visit: Admitting: Professional

## 2023-09-20 ENCOUNTER — Emergency Department (HOSPITAL_COMMUNITY)

## 2023-09-20 DIAGNOSIS — S0003XS Contusion of scalp, sequela: Secondary | ICD-10-CM

## 2023-09-20 DIAGNOSIS — S060X1A Concussion with loss of consciousness of 30 minutes or less, initial encounter: Secondary | ICD-10-CM | POA: Diagnosis not present

## 2023-09-20 DIAGNOSIS — F411 Generalized anxiety disorder: Secondary | ICD-10-CM | POA: Diagnosis not present

## 2023-09-20 DIAGNOSIS — I10 Essential (primary) hypertension: Secondary | ICD-10-CM

## 2023-09-20 DIAGNOSIS — S0003XA Contusion of scalp, initial encounter: Secondary | ICD-10-CM | POA: Insufficient documentation

## 2023-09-20 DIAGNOSIS — R531 Weakness: Secondary | ICD-10-CM

## 2023-09-20 DIAGNOSIS — Z8639 Personal history of other endocrine, nutritional and metabolic disease: Secondary | ICD-10-CM

## 2023-09-20 DIAGNOSIS — W19XXXA Unspecified fall, initial encounter: Secondary | ICD-10-CM | POA: Diagnosis present

## 2023-09-20 DIAGNOSIS — N951 Menopausal and female climacteric states: Secondary | ICD-10-CM | POA: Insufficient documentation

## 2023-09-20 LAB — BASIC METABOLIC PANEL WITH GFR
Anion gap: 13 (ref 5–15)
BUN: 12 mg/dL (ref 6–20)
CO2: 22 mmol/L (ref 22–32)
Calcium: 9.1 mg/dL (ref 8.9–10.3)
Chloride: 99 mmol/L (ref 98–111)
Creatinine, Ser: 0.77 mg/dL (ref 0.44–1.00)
GFR, Estimated: >60 mL/min (ref >60)
Glucose, Bld: 107 mg/dL — ABNORMAL HIGH (ref 70–99)
Potassium: 4 mmol/L (ref 3.5–5.1)
Sodium: 134 mmol/L — ABNORMAL LOW (ref 135–145)

## 2023-09-20 LAB — LIPID PANEL
Cholesterol: 202 mg/dL — ABNORMAL HIGH (ref 0–200)
HDL: 55 mg/dL (ref >40)
LDL Cholesterol: 130 mg/dL — ABNORMAL HIGH (ref 0–99)
Total CHOL/HDL Ratio: 3.7 ratio
Triglycerides: 86 mg/dL (ref <150)
VLDL: 17 mg/dL (ref 0–40)

## 2023-09-20 LAB — CBC
HCT: 40.8 % (ref 36.0–46.0)
Hemoglobin: 12.7 g/dL (ref 12.0–15.0)
MCH: 26.2 pg (ref 26.0–34.0)
MCHC: 31.1 g/dL (ref 30.0–36.0)
MCV: 84.3 fL (ref 80.0–100.0)
Platelets: 260 K/uL (ref 150–400)
RBC: 4.84 MIL/uL (ref 3.87–5.11)
RDW: 14.6 % (ref 11.5–15.5)
WBC: 12.2 K/uL — ABNORMAL HIGH (ref 4.0–10.5)
nRBC: 0 % (ref 0.0–0.2)

## 2023-09-20 LAB — URINE DRUG SCREEN
Amphetamines: NEGATIVE
Barbiturates: NEGATIVE
Benzodiazepines: NEGATIVE
Cocaine: NEGATIVE
Fentanyl: NEGATIVE
Methadone Scn, Ur: NEGATIVE
Opiates: NEGATIVE
Tetrahydrocannabinol: NEGATIVE

## 2023-09-20 LAB — PROTIME-INR
INR: 0.9 (ref 0.8–1.2)
Prothrombin Time: 13.2 s (ref 11.4–15.2)

## 2023-09-20 LAB — ECHOCARDIOGRAM COMPLETE
Area-P 1/2: 3.16 cm2
Height: 66 in
S' Lateral: 3 cm
Weight: 3456.81 [oz_av]

## 2023-09-20 LAB — APTT: aPTT: 30 s (ref 24–36)

## 2023-09-20 LAB — PREGNANCY, URINE: Preg Test, Ur: NEGATIVE

## 2023-09-20 LAB — HEMOGLOBIN A1C
Hgb A1c MFr Bld: 5.4 % (ref 4.8–5.6)
Mean Plasma Glucose: 108.28 mg/dL

## 2023-09-20 MED ORDER — STROKE: EARLY STAGES OF RECOVERY BOOK
Freq: Once | Status: DC
  Filled 2023-09-20: qty 1

## 2023-09-20 MED ORDER — CITALOPRAM HYDROBROMIDE 10 MG PO TABS
40.0000 mg | ORAL_TABLET | Freq: Every day | ORAL | Status: DC
  Administered 2023-09-20: 40 mg via ORAL
  Filled 2023-09-20: qty 4

## 2023-09-20 MED ORDER — METOCLOPRAMIDE HCL 5 MG/ML IJ SOLN
5.0000 mg | Freq: Once | INTRAMUSCULAR | Status: AC
  Administered 2023-09-20: 5 mg via INTRAVENOUS
  Filled 2023-09-20: qty 2

## 2023-09-20 MED ORDER — DIPHENHYDRAMINE HCL 50 MG/ML IJ SOLN
12.5000 mg | Freq: Once | INTRAMUSCULAR | Status: AC
  Administered 2023-09-20: 12.5 mg via INTRAVENOUS
  Filled 2023-09-20: qty 1

## 2023-09-20 MED ORDER — SENNOSIDES-DOCUSATE SODIUM 8.6-50 MG PO TABS: 1.0000 | ORAL_TABLET | Freq: Every evening | ORAL | Status: DC | PRN

## 2023-09-20 MED ORDER — ATORVASTATIN CALCIUM 40 MG PO TABS
40.0000 mg | ORAL_TABLET | Freq: Every day | ORAL | Status: DC
  Administered 2023-09-20: 40 mg via ORAL
  Filled 2023-09-20: qty 1

## 2023-09-20 MED ORDER — AMLODIPINE BESYLATE 5 MG PO TABS: 5.0000 mg | ORAL_TABLET | Freq: Every day | ORAL | Status: DC

## 2023-09-20 MED ORDER — LORAZEPAM 2 MG/ML IJ SOLN
1.0000 mg | Freq: Once | INTRAMUSCULAR | Status: AC
  Administered 2023-09-20: 1 mg via INTRAVENOUS
  Filled 2023-09-20: qty 1

## 2023-09-20 MED ORDER — MORPHINE SULFATE (PF) 4 MG/ML IV SOLN
4.0000 mg | Freq: Once | INTRAVENOUS | Status: AC
  Administered 2023-09-20: 4 mg via INTRAVENOUS
  Filled 2023-09-20: qty 1

## 2023-09-20 MED ORDER — LEVOTHYROXINE SODIUM 100 MCG PO TABS
100.0000 ug | ORAL_TABLET | Freq: Every day | ORAL | Status: DC
  Administered 2023-09-20: 100 ug via ORAL
  Filled 2023-09-20: qty 1

## 2023-09-20 MED ORDER — ESTRADIOL 0.05 MG/24HR TD PTWK
0.0750 mg | MEDICATED_PATCH | TRANSDERMAL | Status: DC
  Administered 2023-09-20: 0.075 mg via TRANSDERMAL
  Filled 2023-09-20 (×2): qty 1

## 2023-09-20 MED ORDER — LORAZEPAM 2 MG/ML IJ SOLN
INTRAMUSCULAR | Status: AC
  Filled 2023-09-20: qty 1

## 2023-09-20 MED ORDER — IOHEXOL 350 MG/ML SOLN
75.0000 mL | Freq: Once | INTRAVENOUS | Status: AC | PRN
  Administered 2023-09-20: 75 mL via INTRAVENOUS

## 2023-09-20 MED ORDER — SODIUM CHLORIDE 0.9 % IV SOLN: INTRAVENOUS | Status: DC

## 2023-09-20 MED ORDER — ATORVASTATIN CALCIUM 40 MG PO TABS
40.0000 mg | ORAL_TABLET | Freq: Every day | ORAL | 0 refills | Status: AC
  Filled 2023-09-20: qty 30, 30d supply, fill #0

## 2023-09-20 MED ORDER — IBUPROFEN 200 MG PO TABS
200.0000 mg | ORAL_TABLET | Freq: Four times a day (QID) | ORAL | Status: DC | PRN
  Administered 2023-09-20: 200 mg via ORAL
  Filled 2023-09-20: qty 1

## 2023-09-20 MED ORDER — HYDRALAZINE HCL 20 MG/ML IJ SOLN: 10.0000 mg | Freq: Four times a day (QID) | INTRAMUSCULAR | Status: DC | PRN

## 2023-09-20 NOTE — Consult Note (Signed)
 [facility] Consult [facility]   Patient Name:   Maureen [name], Maureen [name] Date of Birth:   [birth year] Identification Number:   MRN - [id number] Date of Service:   09/20/2023 00:02:56  Diagnosis:       I63.89 - Cerebrovascular accident (CVA) due to other mechanism [facility])  Impression:      53 yo woman with no significant PMHx who presented with a fall (direct head to head) and right leg weakness started 4.25 hours prior to evaluation. NIHSS is 1 for right leg drift. Head CT is notable for large left forehead scalp hematoma. I discussed with patient and her mother the risks and benefits of TNK emphasizing that the risk of bleeding from the large scalp hematoma is high but not absolute contraindications of TNK. Patient and family opted to no TNK as weakness is improving and fearing the risk of bleeding.      Recommendations:  - SBP goals 120-160  - MRI brain wo contrast  - CTA head/neck to rule out dissection    ------------------------------------------------------------------------------  Advanced Imaging: Advanced Imaging Deferred because:  Non-disabling symptoms as verified by the patient; no cortical signs so not consistent with LVO   Metrics: Last Known Well: 09/19/2023 20:15:00 Dispatch Time: 09/20/2023 00:02:56 Arrival Time: 09/19/2023 20:57:00 Initial Response Time: 09/20/2023 00:18:15 Symptoms: Fall. Initial patient interaction: 09/20/2023 00:21:11 NIHSS Assessment Completed: 09/20/2023 00:27:24 Patient is not a candidate for Thrombolytic. Thrombolytic Medical Decision: 09/20/2023 00:27:25 Patient was not deemed candidate for Thrombolytic because of following reasons: Stroke severity too mild (non-disabling) .  CT Head: I personally reviewed all the CT images that were available to me and it showed: no acute findings  Primary Provider Notified of Diagnostic Impression and Management Plan on: 09/20/2023  00:33:54    ------------------------------------------------------------------------------  History of Present Illness: Patient is a 53 year old Female.  Patient was brought by EMS for symptoms of Fall. 53 yo woman with no significant PMHx who presented with a fall (direct head to head) and right leg weakness started 4.25 hours prior to evaluation. She was walking her dog outside when another dog ran into her and she slipped and fell face down with direct hit to head on the concrete. She lost consciousness for few seconds. Family called ambulance for her and reported that her right leg was weak. No hx of strokes, no back pain, no leg pain.   Past Medical History:      There is no history of Hypertension      There is no history of Diabetes Mellitus      There is no history of Stroke  Medications:  No Anticoagulant use  No Antiplatelet use Reviewed EMR for current medications  Allergies:  Reviewed  Social History: Smoking: No Alcohol Use: No Drug Use: No  Family History:  There is no family history of premature cerebrovascular disease pertinent to this consultation  ROS : 14 Points Review of Systems was performed and was negative except mentioned in HPI.  Past Surgical History: There Is No Surgical History Contributory To Today's Visit    Examination: BP(181/110), Pulse(79), Blood Glucose(111) 1A: Level of Consciousness - Alert; keenly responsive + 0 1B: Ask Month and Age - Both Questions Right + 0 1C: Blink Eyes & Squeeze Hands - Performs Both Tasks + 0 2: Test Horizontal Extraocular Movements - Normal + 0 3: Test Visual Fields - No Visual Loss + 0 4: Test Facial Palsy (Use Grimace if Obtunded) - Normal symmetry + 0 5A: Test Left Arm  Motor Drift - No Drift for 10 Seconds + 0 5B: Test Right Arm Motor Drift - No Drift for 10 Seconds + 0 6A: Test Left Leg Motor Drift - No Drift for 5 Seconds + 0 6B: Test Right Leg Motor Drift - Drift, but doesn't hit bed + 1 7:  Test Limb Ataxia (FNF/Heel-Shin) - No Ataxia + 0 8: Test Sensation - Normal; No sensory loss + 0 9: Test Language/Aphasia - Normal; No aphasia + 0 10: Test Dysarthria - Normal + 0 11: Test Extinction/Inattention - No abnormality + 0  NIHSS Score: 1   Pre-Morbid Modified Rankin Scale: 0 Points = No symptoms at all  Spoke with : Dr. Griselda I reviewed the available imaging via Rapid and initiated discussion with the primary provider  This consult was conducted in real time using interactive audio and video technology. Patient was informed of the technology being used for this visit and agreed to proceed. Patient located in hospital and provider located at home/office setting.   Patient is being evaluated for possible acute neurologic impairment and high probability of imminent or life-threatening deterioration. I spent total of 45 minutes providing care to this patient, including time for face to face visit via telemedicine, review of medical records, imaging studies and discussion of findings with providers, the patient and/or family.    Dr Si Avagail Whittlesey   [facility] For Inpatient follow-up with [facility] physician please call [facility] at [phone]. As we are not an outpatient service for any post hospital discharge needs please contact the hospital for assistance. If you have any questions for the [facility] physicians or need to reconsult for clinical or diagnostic changes please contact us  via [facility] at [phone].   Signature : Tylee Newby

## 2023-09-20 NOTE — Evaluation (Signed)
 Occupational Therapy Evaluation Patient Details Name: Maureen [name] MRN: [id number] DOB: 31-Aug-1970 Today's Date: 09/20/2023   History of Present Illness   Maureen [name] is a 53 y.o. female presented for a fall.  Patient directly fell on the concrete hitting on the head and since the episode patient complaining about right sided lower extremity weakness started started around 8:30 PM last night 9/2.  Patient was walking dog outside when another dog ran into her and she slipped and fell face down directly hit the head on the concrete.  Patient also lost consciousness for few seconds.  Family called ambulance and reported that patient feeling right-sided leg weakness. In ED for w/o right-sided weakness postconcussion. MRI this am, revealed soft tissue frontal hematoma.   PMH: essential hypertension, generalized anxiety disorder and hypothyroidism     Clinical Impressions Patient evaluated by Occupational Therapy with no further acute OT needs identified. All education has been completed and the patient has no further questions. Patient independent without AD to ambulate on ED unit, use of bathroom and perform all basic ADL's with no LOB, weakness, sensory or apparent language/cognitive deficits noted. Educated on pacing, positioning and basic energy conservation with + teach back.  See below for any follow-up Occupational Therapy or equipment needs- no follow up OT needed. OT is signing off. Thank you for this referral.      If plan is discharge home, recommend the following:   Assist for transportation     Functional Status Assessment   Patient has not had a recent decline in their functional status     Equipment Recommendations   None recommended by OT (patient to obtain a reacher and long sponge on own if needed)      Precautions/Restrictions   Restrictions Weight Bearing Restrictions Per Provider Order: No     Mobility Bed Mobility Overal bed mobility: Independent              General bed mobility comments: moved from base of ED guerny to head indep    Transfers Overall transfer level: Independent                 General transfer comment: amb to and from ED room to bathroom then complete circle ED with no gait deviations, LOB or balance issues noted      Balance Overall balance assessment: No apparent balance deficits (not formally assessed)                                         ADL either performed or assessed with clinical judgement   ADL Overall ADL's : At baseline;Independent                                             Vision Baseline Vision/History: 0 No visual deficits Ability to See in Adequate Light: 0 Adequate Patient Visual Report: No change from baseline (L orbital edema however when tested open, no visual deficits)              Pertinent Vitals/Pain Pain Assessment Pain Assessment: No/denies pain     Extremity/Trunk Assessment Upper Extremity Assessment Upper Extremity Assessment: Right hand dominant;Overall [facility] for tasks assessed   Lower Extremity Assessment Lower Extremity Assessment: Overall WFL for tasks assessed (LE weakness resolved)  Cervical / Trunk Assessment Cervical / Trunk Assessment: Normal   Communication Communication Communication: No apparent difficulties   Cognition Arousal: Alert Behavior During Therapy: WFL for tasks assessed/performed Cognition: No apparent impairments             OT - Cognition Comments: all confusion, STM issues resolved                 Following commands: Intact       Cueing  General Comments      L orbital edema, R knee scrape           Home Living Family/patient expects to be discharged to:: Private residence Living Arrangements: Children Available Help at Discharge: Available 24 hours/day;Family Type of Home: House Home Access: Level entry     Home Layout: Two level;1/2 bath on main  level;Full bath on main level Alternate Level Stairs-Number of Steps: FF with rail   Bathroom Shower/Tub: Walk-in shower;Door   [employer] Toilet: Standard     Home Equipment: None          Prior Functioning/Environment Prior Level of Function : Independent/Modified Independent;Working/employed;Driving                     AM-PAC OT 6 Clicks Daily Activity     Outcome Measure Help from another person eating meals?: None Help from another person taking care of personal grooming?: None Help from another person toileting, which includes using toliet, bedpan, or urinal?: None Help from another person bathing (including washing, rinsing, drying)?: None Help from another person to put on and taking off regular upper body clothing?: None Help from another person to put on and taking off regular lower body clothing?: None 6 Click Score: 24   End of Session Equipment Utilized During Treatment: Gait belt Nurse Communication: Mobility status;Other (comment) (vioding added to Flowsheets)  Activity Tolerance: Patient tolerated treatment well Patient left: in bed;with call bell/phone within reach;with bed alarm set                   Time: 8669-8647 OT Time Calculation (min): 22 min Charges:  OT General Charges $OT Visit: 1 Visit OT Evaluation $OT Eval Low Complexity: 1 Low  Ossie Beltran OT/L Acute Rehabilitation Department  [phone]  09/20/2023, 2:07 PM

## 2023-09-20 NOTE — Hospital Course (Signed)
 53 y.o. female past medical history of essential hypertension, generalized anxiety disorder and hypothyroidism presented for a fall.  Patient directly fell on the concrete hitting on the head and since the episode patient complaining about right sided lower extremity weakness started started around 8:30 PM last night 9/2.  Patient was walking dog outside when another dog ran into her and she slipped and fell face down directly hit the head on the concrete.  Patient also lost consciousness for few seconds.  Family called ambulance and reported that patient feeling right-sided leg weakness.  No previous history of stroke.  Denies any pain.

## 2023-09-20 NOTE — ED Notes (Signed)
 Patient transported to CT

## 2023-09-20 NOTE — ED Provider Notes (Incomplete)
  [place] [facility] AT [facility] Provider Note   CSN: [id number] Arrival date & time: 09/19/23  2057     Patient presents with: Head Injury   Maureen [name] is a 53 y.o. female.  {Add pertinent medical, surgical, social history, OB history to HPI:32947}  Head Injury      Prior to Admission medications   Medication Sig Start Date End Date Taking? Authorizing Provider  amLODipine  (NORVASC ) 5 MG tablet Take 5 mg by mouth daily.    [provider]  levothyroxine  (SYNTHROID ) 100 MCG tablet Take 1 tablet (100 mcg total) by mouth daily before breakfast. 11/20/18   Whitmire, Dawn, FNP  norethindrone-ethinyl estradiol  (JUNEL FE,GILDESS FE,LOESTRIN FE) 1-20 MG-MCG tablet Take 1 tablet by mouth daily.    [provider]  traZODone  (DESYREL ) 50 MG tablet Take 1 tablet (50 mg total) by mouth at bedtime. 07/01/19   Whitmire, Dawn, FNP    Allergies: Lactose intolerance (gi) and Vicodin [hydrocodone-acetaminophen]    Review of Systems  Updated Vital Signs BP (!) 178/107   Pulse 73   Temp 98.4 F (36.9 C)   Resp 19   Ht 5' 6 (1.676 m)   Wt 98 kg   SpO2 97%   BMI 34.87 kg/m   Physical Exam  (all labs ordered are listed, but only abnormal results are displayed) Labs Reviewed  CBC WITH DIFFERENTIAL/PLATELET - Abnormal; Notable for the following components:      Result Value   WBC 11.3 (*)    All other components within normal limits  I-STAT CHEM 8, ED - Abnormal; Notable for the following components:   Glucose, Bld 111 (*)    Calcium , Ion 1.14 (*)    All other components within normal limits  ETHANOL  COMPREHENSIVE METABOLIC PANEL WITH GFR  URINE DRUG SCREEN  PREGNANCY, URINE  PROTIME-INR  APTT    EKG: None  Radiology: No results found.  {Document cardiac monitor, telemetry assessment procedure when appropriate:32947} Procedures   Medications Ordered in the ED - No data to display    {Click here for ABCD2, HEART and  other calculators REFRESH Note before signing:1}                              Medical Decision Making Amount and/or Complexity of Data Reviewed Labs: ordered. Radiology: ordered.   ***  {Document critical care time when appropriate  Document review of labs and clinical decision tools ie CHADS2VASC2, etc  Document your independent review of radiology images and any outside records  Document your discussion with family members, caretakers and with consultants  Document social determinants of health affecting pt's care  Document your decision making why or why not admission, treatments were needed:32947:::1}   Final diagnoses:  None    ED Discharge Orders     None

## 2023-09-20 NOTE — Progress Notes (Signed)
 PT Cancellation Note  Patient Details Name: Maureen [name] MRN: [id number] DOB: [birth year]   Cancelled Treatment:    Reason Eval/Treat Not Completed: PT screened, no needs identified, will sign off Patient observed ambulating, no   loss of balance. Darice Potters PT Acute [facility] Services Office [phone]   Potters Darice Norris 09/20/2023, 1:59 PM

## 2023-09-20 NOTE — ED Notes (Signed)
 Patient transported to MRI

## 2023-09-20 NOTE — Progress Notes (Signed)
 Discharge med in a secure bag delivered to pt in room by this RN

## 2023-09-20 NOTE — Discharge Summary (Signed)
 Physician Discharge Summary   Patient: Maureen [name] MRN: [id number] DOB: 11-11-1970  Admit date:     09/19/2023  Discharge date: 09/20/23  Discharge Physician: Garnette Pelt   PCP: Valma Carwin, MD   Recommendations at discharge:    Follow up with PCP in 1-2 weeks  Discharge Diagnoses: Principal Problem:   Right sided weakness Active Problems:   Concussion with brief LOC   Essential hypertension   GAD (generalized anxiety disorder)   Scalp hematoma   History of hypothyroidism   Postmenopausal syndrome   Fall  Resolved Problems:   * No resolved hospital problems. *  Hospital Course: 53 y.o. female past medical history of essential hypertension, generalized anxiety disorder and hypothyroidism presented for a fall.  Patient directly fell on the concrete hitting on the head and since the episode patient complaining about right sided lower extremity weakness started started around 8:30 PM last night 9/2.  Patient was walking dog outside when another dog ran into her and she slipped and fell face down directly hit the head on the concrete.  Patient also lost consciousness for few seconds.  Family called ambulance and reported that patient feeling right-sided leg weakness.  No previous history of stroke.  Denies any pain.   Assessment and Plan: Right sided upper and lower extremity weakness-resolved differential acute stroke vs postconcussion syndrome Mechanical fall and hitting of the head on concrete Left-sided scalp frontal hematoma-secondary to mechanical fall Left-sided eyelid bruising from scalp hematoma and fall -Presented to emergency department mechanical fall and landed face directed hitting the head on the concrete followed by loss of consciousness for few seconds.  Since then patient right-sided upper and lower extremity weakness that started at 8 PM 9/2.   Right-sided weakness has been resolved which lasted for 4 hours.   Patient reported brief loss of consciousness for  few seconds after the fall and then she walked to home by herself.  Denies any tremor, tingling, sensory change, vision change, and seizure. - At presentation to ED patient found borderline hypertensive after her blood pressure improved. - CBC showing mild cytosis.  BMP unremarkable. - CT head, CT cervical spine, CT maxillofacial and CT head and neck unremarkable. - Neurology consulted. MRI obtained and reviewed, negative for CVA, but re demonstrates soft tissue hematoma -Symptoms resolved with no neurologic deficit. Discussed with Stroke MD who reviewed findings. Stable to d/c today with outpt f/u with Neurology -Statin prescribed by neurologist -Cleared by PT/OT     History of essential hypertension -At presentation to ED systolic blood pressure 130s range and pain improved after IV morphine . -remained stable   Hypothyroidism -Continue levothyroxine    Generalized anxiety disorder - Continue Celexa    Postmenopausal syndrome - Patient is aware estradiol  patch twice weekly.  Was removed at time of presentation given initial concern of possible CVA -Resumed patch.        Consultants: Discussed case with Neurology Procedures performed:   Disposition: Home Diet recommendation:  Regular diet DISCHARGE MEDICATION: Allergies as of 09/20/2023       Reactions   Lactose Intolerance (gi)    Vicodin [hydrocodone-acetaminophen] Nausea And Vomiting        Medication List     TAKE these medications    ALPRAZolam 0.5 MG tablet Commonly known as: XANAX Take 0.5 mg by mouth daily as needed for anxiety.   atorvastatin  40 MG tablet Commonly known as: LIPITOR Take 1 tablet (40 mg total) by mouth daily. Start taking on: September 21, 2023  citalopram  40 MG tablet Commonly known as: CELEXA  Take 40 mg by mouth daily.   estradiol  0.075 mg/24hr patch Commonly known as: CLIMARA  - Dosed in mg/24 hr Place 0.075 mg onto the skin once a week.   levothyroxine  100 MCG tablet Commonly  known as: SYNTHROID  Take 1 tablet (100 mcg total) by mouth daily before breakfast.   traZODone  50 MG tablet Commonly known as: DESYREL  Take 1 tablet (50 mg total) by mouth at bedtime. What changed:  when to take this reasons to take this        Follow-up Information     Valma Carwin, MD Follow up in 2 week(s).   Specialty: Internal Medicine Why: Hospital follow up Contact information: [id number] [place] [address] [phone]                Discharge Exam: Filed Weights   09/19/23 2110  Weight: 98 kg   General exam: Awake, laying in bed, in nad Respiratory system: Normal respiratory effort, no wheezing Cardiovascular system: regular rate, s1, s2 Gastrointestinal system: Soft, nondistended, positive BS Central nervous system: CN2-12 grossly intact, strength intact Extremities: Perfused, no clubbing Skin: Normal skin turgor, no notable skin lesions seen Psychiatry: Mood normal // no visual hallucinations   Condition at discharge: fair  The results of significant diagnostics from this hospitalization (including imaging, microbiology, ancillary and laboratory) are listed below for reference.   Imaging Studies: ECHOCARDIOGRAM COMPLETE Result Date: 09/20/2023    ECHOCARDIOGRAM REPORT   Patient Name:   Maureen [name] Date of Exam: 09/20/2023 Medical Rec #:  [id number]       Height:       66.0 in Accession #:    [id number]      Weight:       216.0 lb Date of Birth:  [birth year]       BSA:          2.067 m Patient Age:    53 years        BP:           148/91 mmHg Patient Gender: F               HR:           80 bpm. Exam Location:  Inpatient Procedure: 2D Echo, Cardiac Doppler and Color Doppler (Both Spectral and Color            Flow Doppler were utilized during procedure). Indications:    Stroke I63.9  History:        Patient has no prior history of Echocardiogram examinations.                 Signs/Symptoms:Dyspnea; Risk Factors:Hypertension and                  Pre-diabetes.  Sonographer:    Koleen Popper RDCS Referring Phys: [id number] SUBRINA SUNDIL  Sonographer Comments: Image acquisition challenging due to respiratory motion. IMPRESSIONS  1. Left ventricular ejection fraction, by estimation, is 60 to 65%. The left ventricle has normal function. The left ventricle has no regional wall motion abnormalities. Left ventricular diastolic parameters were normal.  2. Right ventricular systolic function is normal. The right ventricular size is normal. There is normal pulmonary artery systolic pressure.  3. The mitral valve is normal in structure. Trivial mitral valve regurgitation. No evidence of mitral stenosis.  4. The aortic valve is tricuspid. Aortic valve regurgitation is not visualized. No aortic stenosis is present.  5. The inferior vena  cava is dilated in size with <50% respiratory variability, suggesting right atrial pressure of 15 mmHg. FINDINGS  Left Ventricle: Left ventricular ejection fraction, by estimation, is 60 to 65%. The left ventricle has normal function. The left ventricle has no regional wall motion abnormalities. The left ventricular internal cavity size was normal in size. There is  no left ventricular hypertrophy. Left ventricular diastolic parameters were normal. Right Ventricle: The right ventricular size is normal. No increase in right ventricular wall thickness. Right ventricular systolic function is normal. There is normal pulmonary artery systolic pressure. The tricuspid regurgitant velocity is 2.58 m/s, and  with an assumed right atrial pressure of 8 mmHg, the estimated right ventricular systolic pressure is 34.6 mmHg. Left Atrium: Left atrial size was normal in size. Right Atrium: Right atrial size was normal in size. Pericardium: There is no evidence of pericardial effusion. Mitral Valve: The mitral valve is normal in structure. Trivial mitral valve regurgitation. No evidence of mitral valve stenosis. Tricuspid Valve: The tricuspid valve is  normal in structure. Tricuspid valve regurgitation is mild . No evidence of tricuspid stenosis. Aortic Valve: The aortic valve is tricuspid. Aortic valve regurgitation is not visualized. No aortic stenosis is present. Pulmonic Valve: The pulmonic valve was normal in structure. Pulmonic valve regurgitation is trivial. No evidence of pulmonic stenosis. Aorta: The aortic root is normal in size and structure. Venous: The inferior vena cava is dilated in size with less than 50% respiratory variability, suggesting right atrial pressure of 15 mmHg. IAS/Shunts: No atrial level shunt detected by color flow Doppler.  LEFT VENTRICLE PLAX 2D LVIDd:         4.90 cm   Diastology LVIDs:         3.00 cm   LV e' medial:    6.42 cm/s LV PW:         1.00 cm   LV E/e' medial:  11.6 LV IVS:        1.00 cm   LV e' lateral:   5.98 cm/s LVOT diam:     1.90 cm   LV E/e' lateral: 12.5 LV SV:         64 LV SV Index:   31 LVOT Area:     2.84 cm  RIGHT VENTRICLE             IVC RV S prime:     14.90 cm/s  IVC diam: 2.60 cm TAPSE (M-mode): 2.0 cm LEFT ATRIUM             Index LA diam:        3.30 cm 1.60 cm/m LA Vol (A2C):   42.1 ml 20.37 ml/m LA Vol (A4C):   48.2 ml 23.32 ml/m LA Biplane Vol: 48.1 ml 23.28 ml/m  AORTIC VALVE LVOT Vmax:   118.00 cm/s LVOT Vmean:  77.100 cm/s LVOT VTI:    0.224 m  AORTA Ao Root diam: 2.60 cm Ao Asc diam:  3.40 cm MITRAL VALVE               TRICUSPID VALVE MV Area (PHT): 3.16 cm    TR Peak grad:   26.6 mmHg MV Decel Time: 240 msec    TR Vmax:        258.00 cm/s MV E velocity: 74.70 cm/s MV A velocity: 79.60 cm/s  SHUNTS MV E/A ratio:  0.94        Systemic VTI:  0.22 m  Systemic Diam: 1.90 cm Joelle Cedars Tonleu Electronically signed by Joelle Cedars Ny Signature Date/Time: 09/20/2023/12:15:47 PM    Final    MR BRAIN WO CONTRAST Result Date: 09/20/2023 EXAM: MRI BRAIN WITHOUT CONTRAST 09/20/2023 07:18:32 AM TECHNIQUE: Multiplanar multisequence MRI of the head/brain was performed  without the administration of intravenous contrast. COMPARISON: CT of the head dated 09/19/2023. CLINICAL HISTORY: Stroke, follow up. Per notes, Pt fell 20 minutes PTA, hit head on concrete. Pt was pulled by a dog while walking it. No LOC, no blood thinners. Large hematoma to left eyebrow. Pt is confused and states she does not know her daughters who are with her, unable to give her name. FINDINGS: BRAIN AND VENTRICLES: No acute infarct. No intracranial hemorrhage. No mass. No midline shift. No hydrocephalus. The sella is unremarkable. Normal flow voids. ORBITS: No acute abnormality. SINUSES AND MASTOIDS: No acute abnormality. BONES AND SOFT TISSUES: Normal marrow signal. Left frontal subgaleal hematoma again demonstrated. There is also left periorbital soft tissue swelling. IMPRESSION: 1. No acute intracranial abnormality. 2. Left frontal subgaleal hematoma and left periorbital soft tissue swelling. Electronically signed by: Evalene Coho MD 09/20/2023 07:27 AM EDT RP Workstation: [id number]   CT Angio Head Neck W WO CM (CODE STROKE) Result Date: 09/20/2023 EXAM: CT HEAD WITHOUT CTA HEAD AND NECK WITH AND WITHOUT 09/20/2023 03:06:04 AM TECHNIQUE: CTA of the head and neck was performed with and without the administration of intravenous contrast. Noncontrast CT of the head with reconstructed 2-D images are also provided for review. Multiplanar 2D and/or 3D reformatted images are provided for review. Automated exposure control, iterative reconstruction, and/or weight based adjustment of the mA/kV was utilized to reduce the radiation dose to as low as reasonably achievable. COMPARISON: None available CLINICAL HISTORY: Neuro deficit, acute, stroke suspected. Weakness, s/p fall on concrete with head injury, evaluate for dissection per Neuro notes, wbc's 11.3, GFR>60, ct head code stroke 09/19/23 @ 2355; 75 ml omni 350 FINDINGS: CT HEAD: BRAIN AND VENTRICLES: No acute intracranial hemorrhage. No mass effect. No  midline shift. No extra-axial fluid collection. No evidence of acute infarct. No hydrocephalus. ORBITS: No acute abnormality. SINUSES AND MASTOIDS: No acute abnormality. CTA NECK: AORTIC ARCH AND ARCH VESSELS: No dissection or arterial injury. Mild calcific aortic atherosclerosis. CERVICAL CAROTID ARTERIES: No dissection, arterial injury, or hemodynamically significant stenosis by NASCET criteria. Minimal atherosclerotic calcification of the left carotid bifurcation without hemodynamically significant stenosis. CERVICAL VERTEBRAL ARTERIES: No dissection, arterial injury, or significant stenosis. LUNGS AND MEDIASTINUM: Unremarkable. SOFT TISSUES: Large left frontal scalp hematoma. BONES: No acute abnormality. CTA HEAD: ANTERIOR CIRCULATION: No significant stenosis of the internal carotid arteries. No significant stenosis of the anterior cerebral arteries. No significant stenosis of the middle cerebral arteries. No aneurysm. POSTERIOR CIRCULATION: No significant stenosis of the posterior cerebral arteries. No significant stenosis of the basilar artery. No significant stenosis of the vertebral arteries. No aneurysm. OTHER: No dural venous sinus thrombosis on this non-dedicated study. IMPRESSION: 1. No acute findings. 2. Large left frontal scalp hematoma. Electronically signed by: Franky Stanford MD 09/20/2023 03:12 AM EDT RP Workstation: [id number]   CT Maxillofacial Wo Contrast Result Date: 09/20/2023 CLINICAL DATA:  Facial trauma, blunt; Neuro deficit, acute, stroke suspected; Neck trauma, dangerous injury mechanism (Age 45-64y). Fall, head injury EXAM: CT HEAD WITHOUT CONTRAST CT MAXILLOFACIAL WITHOUT CONTRAST CT CERVICAL SPINE WITHOUT CONTRAST TECHNIQUE: Multidetector CT imaging of the head, cervical spine, and maxillofacial structures were performed using the standard protocol without intravenous contrast. Multiplanar CT image reconstructions of the  cervical spine and maxillofacial structures were also generated.  RADIATION DOSE REDUCTION: This exam was performed according to the departmental dose-optimization program which includes automated exposure control, adjustment of the mA and/or kV according to patient size and/or use of iterative reconstruction technique. COMPARISON:  None Available. FINDINGS: CT HEAD FINDINGS Brain: No evidence of acute infarction, hemorrhage, hydrocephalus, extra-axial collection or mass lesion/mass effect. Vascular: No hyperdense vessel or unexpected calcification. Skull: Normal. Negative for fracture or focal lesion. Other: Mastoid air cells and middle ear cavities are clear. Large left supraorbital scalp hematoma. CT MAXILLOFACIAL FINDINGS Osseous: No fracture or mandibular dislocation. No destructive process. Orbits: Marked left preseptal soft tissue swelling. Ocular globes are intact. Ocular lenses are ortho topically position. No retro-orbital traumatic or inflammatory change identified. Sinuses: Clear. Soft tissues: Unremarkable CT CERVICAL SPINE FINDINGS Alignment: Normal. Skull base and vertebrae: No acute fracture. No primary bone lesion or focal pathologic process. Soft tissues and spinal canal: No prevertebral fluid or swelling. No visible canal hematoma. Disc levels: Disc space narrowing and endplate remodeling of C5-C7 is present in keeping with changes of advanced degenerative disc disease. Prevertebral soft tissues are not thickened on sagittal reformats. No high-grade canal stenosis. No high-grade neuroforaminal narrowing. Upper chest: Negative. Other: None IMPRESSION: 1. No acute intracranial abnormality. No calvarial fracture. Large left supraorbital scalp hematoma. 2. No acute facial fracture. Marked left preseptal soft tissue swelling. 3. No acute fracture or listhesis of the cervical spine. Electronically Signed   By: Dorethia Molt M.D.   On: 09/20/2023 00:20   CT Cervical Spine Wo Contrast Result Date: 09/20/2023 CLINICAL DATA:  Facial trauma, blunt; Neuro deficit, acute,  stroke suspected; Neck trauma, dangerous injury mechanism (Age 64-64y). Fall, head injury EXAM: CT HEAD WITHOUT CONTRAST CT MAXILLOFACIAL WITHOUT CONTRAST CT CERVICAL SPINE WITHOUT CONTRAST TECHNIQUE: Multidetector CT imaging of the head, cervical spine, and maxillofacial structures were performed using the standard protocol without intravenous contrast. Multiplanar CT image reconstructions of the cervical spine and maxillofacial structures were also generated. RADIATION DOSE REDUCTION: This exam was performed according to the departmental dose-optimization program which includes automated exposure control, adjustment of the mA and/or kV according to patient size and/or use of iterative reconstruction technique. COMPARISON:  None Available. FINDINGS: CT HEAD FINDINGS Brain: No evidence of acute infarction, hemorrhage, hydrocephalus, extra-axial collection or mass lesion/mass effect. Vascular: No hyperdense vessel or unexpected calcification. Skull: Normal. Negative for fracture or focal lesion. Other: Mastoid air cells and middle ear cavities are clear. Large left supraorbital scalp hematoma. CT MAXILLOFACIAL FINDINGS Osseous: No fracture or mandibular dislocation. No destructive process. Orbits: Marked left preseptal soft tissue swelling. Ocular globes are intact. Ocular lenses are ortho topically position. No retro-orbital traumatic or inflammatory change identified. Sinuses: Clear. Soft tissues: Unremarkable CT CERVICAL SPINE FINDINGS Alignment: Normal. Skull base and vertebrae: No acute fracture. No primary bone lesion or focal pathologic process. Soft tissues and spinal canal: No prevertebral fluid or swelling. No visible canal hematoma. Disc levels: Disc space narrowing and endplate remodeling of C5-C7 is present in keeping with changes of advanced degenerative disc disease. Prevertebral soft tissues are not thickened on sagittal reformats. No high-grade canal stenosis. No high-grade neuroforaminal narrowing.  Upper chest: Negative. Other: None IMPRESSION: 1. No acute intracranial abnormality. No calvarial fracture. Large left supraorbital scalp hematoma. 2. No acute facial fracture. Marked left preseptal soft tissue swelling. 3. No acute fracture or listhesis of the cervical spine. Electronically Signed   By: Dorethia Molt M.D.   On: 09/20/2023 00:20  CT HEAD CODE STROKE WO CONTRAST Result Date: 09/20/2023 CLINICAL DATA:  Facial trauma, blunt; Neuro deficit, acute, stroke suspected; Neck trauma, dangerous injury mechanism (Age 1-64y). Fall, head injury EXAM: CT HEAD WITHOUT CONTRAST CT MAXILLOFACIAL WITHOUT CONTRAST CT CERVICAL SPINE WITHOUT CONTRAST TECHNIQUE: Multidetector CT imaging of the head, cervical spine, and maxillofacial structures were performed using the standard protocol without intravenous contrast. Multiplanar CT image reconstructions of the cervical spine and maxillofacial structures were also generated. RADIATION DOSE REDUCTION: This exam was performed according to the departmental dose-optimization program which includes automated exposure control, adjustment of the mA and/or kV according to patient size and/or use of iterative reconstruction technique. COMPARISON:  None Available. FINDINGS: CT HEAD FINDINGS Brain: No evidence of acute infarction, hemorrhage, hydrocephalus, extra-axial collection or mass lesion/mass effect. Vascular: No hyperdense vessel or unexpected calcification. Skull: Normal. Negative for fracture or focal lesion. Other: Mastoid air cells and middle ear cavities are clear. Large left supraorbital scalp hematoma. CT MAXILLOFACIAL FINDINGS Osseous: No fracture or mandibular dislocation. No destructive process. Orbits: Marked left preseptal soft tissue swelling. Ocular globes are intact. Ocular lenses are ortho topically position. No retro-orbital traumatic or inflammatory change identified. Sinuses: Clear. Soft tissues: Unremarkable CT CERVICAL SPINE FINDINGS Alignment: Normal.  Skull base and vertebrae: No acute fracture. No primary bone lesion or focal pathologic process. Soft tissues and spinal canal: No prevertebral fluid or swelling. No visible canal hematoma. Disc levels: Disc space narrowing and endplate remodeling of C5-C7 is present in keeping with changes of advanced degenerative disc disease. Prevertebral soft tissues are not thickened on sagittal reformats. No high-grade canal stenosis. No high-grade neuroforaminal narrowing. Upper chest: Negative. Other: None IMPRESSION: 1. No acute intracranial abnormality. No calvarial fracture. Large left supraorbital scalp hematoma. 2. No acute facial fracture. Marked left preseptal soft tissue swelling. 3. No acute fracture or listhesis of the cervical spine. Electronically Signed   By: Dorethia Molt M.D.   On: 09/20/2023 00:20   DG Pelvis 1-2 Views Result Date: 08/31/2023 CLINICAL DATA:  LOW BACK PAIN, FALL. EXAM: PELVIS - 1-2 VIEW COMPARISON:  None Available. FINDINGS: Pelvis is intact with normal and symmetric sacroiliac joints. No acute fracture or dislocation. No aggressive osseous lesion. Visualized sacral arcuate lines are unremarkable. There are changes of chronic pubic symphisitis. Unremarkable bilateral hip joints. No radiopaque foreign bodies. A presumed T-shaped intrauterine device noted overlying the lower sacrum. IMPRESSION: No acute osseous abnormality of the pelvis. Electronically Signed   By: Ree Molt M.D.   On: 08/31/2023 10:44    Microbiology: Results for orders placed or performed in visit on 01/17/19  Novel Coronavirus, NAA ([facility])     Status: None   Collection Time: 01/17/19 11:19 AM   Specimen: Nasopharyngeal(NP) swabs in vial transport medium   NASOPHARYNGE  TESTING  Result Value Ref Range Status   SARS-CoV-2, NAA Not Detected Not Detected Final    Comment: This nucleic acid amplification test was developed and its performance characteristics determined by [employer]. Nucleic  acid amplification tests include PCR and TMA. This test has not been FDA cleared or approved. This test has been authorized by FDA under an Emergency Use Authorization (EUA). This test is only authorized for the duration of time the declaration that circumstances exist justifying the authorization of the emergency use of in vitro diagnostic tests for detection of SARS-CoV-2 virus and/or diagnosis of COVID-19 infection under section 564(b)(1) of the Act, 21 U.S.C. 639aaa-6(a) (1), unless the authorization is terminated or revoked sooner. When diagnostic testing  is negative, the possibility of a false negative result should be considered in the context of a patient's recent exposures and the presence of clinical signs and symptoms consistent with COVID-19. An individual without symptoms of COVID-19 and who is not shedding SARS-CoV-2 virus would  expect to have a negative (not detected) result in this assay.     Labs: CBC: Recent Labs  Lab 09/19/23 2124 09/19/23 2339 09/20/23 0416  WBC 11.3*  --  12.2*  NEUTROABS 6.2  --   --   HGB 13.2 14.6 12.7  HCT 42.8 43.0 40.8  MCV 85.3  --  84.3  PLT 272  --  260   Basic Metabolic Panel: Recent Labs  Lab 09/19/23 2124 09/19/23 2339 09/20/23 0416  NA 135 135 134*  K 3.8 3.8 4.0  CL 100 101 99  CO2 23  --  22  GLUCOSE 84 111* 107*  BUN 16 15 12   CREATININE 0.82 0.80 0.77  CALCIUM  9.5  --  9.1   Liver Function Tests: Recent Labs  Lab 09/19/23 2124  AST 26  ALT 17  ALKPHOS 91  BILITOT 0.7  PROT 7.9  ALBUMIN 4.6   CBG: No results for input(s): GLUCAP in the last 168 hours.  Discharge time spent: less than 30 minutes.  Signed: Garnette Pelt, MD [facility] 09/20/2023

## 2023-09-20 NOTE — Plan of Care (Signed)
 Patient admitted after a fall hitting face to the ground with large left supraorbital scalp hematoma.  Patient also had brief LOC after the fall, concerning for concussion.  Reported right leg weakness but MRI negative.  Per Dr. Cindy, patient now neuro intact, and I feel pt most likely had concussion from fall. The reported right leg weakness may be influenced by mental status and concussion at that time. Less likely stroke especially in the patient with no significant stroke risk factors.  However she does have LDL 130, put on Lipitor 40.  Okay to be discharged from neurostandpoint, will schedule close outpatient neurology follow-up.  Discussed with Dr. Cindy attending physician.  Ary Cummins, MD PhD Stroke Neurology 09/20/2023 6:56 PM

## 2023-09-20 NOTE — Progress Notes (Signed)
  Echocardiogram 2D Echocardiogram has been performed.  Koleen KANDICE Popper, RDCS 09/20/2023, 9:41 AM

## 2023-09-20 NOTE — ED Provider Notes (Signed)
  EMERGENCY DEPARTMENT AT [facility] Provider Note   CSN: [id number] Arrival date & time: 09/19/23  2057     Patient presents with: Head Injury   Maureen [name] is a 53 y.o. female.  Patient reports that at approximately 9:30 PM she was walking her dog and fell.  She reports that she was walking her dog on a leash when another dog ran toward her and she was pulled forward striking her head against the ground.  When she fell she had no loss of consciousness, seizure or repetitive episode of vomiting.  No blood thinner. She has been confused since the incident.  Family member in the room states that she has not been able to recognize her daughter's name, unable to walk and seems to be dazed/confused.  She also notes severe left frontal headache since fall.  She notes right sided leg weakness. No changes in sensation. No neck pain.     Head Injury Associated symptoms: headache        Prior to Admission medications   Medication Sig Start Date End Date Taking? Authorizing Provider  amLODipine  (NORVASC ) 5 MG tablet Take 5 mg by mouth daily.    [provider]  levothyroxine  (SYNTHROID ) 100 MCG tablet Take 1 tablet (100 mcg total) by mouth daily before breakfast. 11/20/18   Whitmire, Dawn, FNP  norethindrone-ethinyl estradiol  (JUNEL FE,GILDESS FE,LOESTRIN FE) 1-20 MG-MCG tablet Take 1 tablet by mouth daily.    [provider]  traZODone  (DESYREL ) 50 MG tablet Take 1 tablet (50 mg total) by mouth at bedtime. 07/01/19   Whitmire, Dawn, FNP    Allergies: Lactose intolerance (gi) and Vicodin [hydrocodone-acetaminophen]    Review of Systems  Neurological:  Positive for headaches.    Updated Vital Signs BP (!) 178/107   Pulse 73   Temp 98.4 F (36.9 C)   Resp 19   Ht 5' 6 (1.676 m)   Wt 98 kg   SpO2 97%   BMI 34.87 kg/m   Physical Exam Vitals and nursing note reviewed.  Constitutional:      General: She is not in acute distress.     Appearance: She is not toxic-appearing.  HENT:     Head: Normocephalic and atraumatic.   Eyes:     General: No scleral icterus.    Conjunctiva/sclera: Conjunctivae normal.  Cardiovascular:     Rate and Rhythm: Normal rate and regular rhythm.     Pulses: Normal pulses.     Heart sounds: Normal heart sounds.  Pulmonary:     Effort: Pulmonary effort is normal. No respiratory distress.     Breath sounds: Normal breath sounds.  Abdominal:     General: Abdomen is flat. Bowel sounds are normal.     Palpations: Abdomen is soft.     Tenderness: There is no abdominal tenderness.  Musculoskeletal:     Right lower leg: No edema.     Left lower leg: No edema.  Skin:    General: Skin is warm and dry.     Findings: No lesion.  Neurological:     General: No focal deficit present.     Mental Status: She is alert and oriented to person, place, and time. Mental status is at baseline.     Comments: GCS 14.  No nystagmus.  Pupils equal and reactive.  No facial droop. Upper extremity sensation and strength intact.  Strong radial pulse. Right lower extremity.  2+ out of 5 strength. Sensation intact.  Strong  dorsal pedal pulse. Left lower extremity 3+ out of 5 strength.  Sensation intact.  Strong dorsal pedal pulse. Reassessment showed resolution of left leg weakness.     (all labs ordered are listed, but only abnormal results are displayed) Labs Reviewed  CBC WITH DIFFERENTIAL/PLATELET - Abnormal; Notable for the following components:      Result Value   WBC 11.3 (*)    All other components within normal limits  I-STAT CHEM 8, ED - Abnormal; Notable for the following components:   Glucose, Bld 111 (*)    Calcium , Ion 1.14 (*)    All other components within normal limits  ETHANOL  URINE DRUG SCREEN  COMPREHENSIVE METABOLIC PANEL WITH GFR  PREGNANCY, URINE  PROTIME-INR  APTT    EKG: None  Radiology: CT Angio Head Neck W WO CM (CODE STROKE) Result Date: 09/20/2023 EXAM: CT HEAD WITHOUT  CTA HEAD AND NECK WITH AND WITHOUT 09/20/2023 03:06:04 AM TECHNIQUE: CTA of the head and neck was performed with and without the administration of intravenous contrast. Noncontrast CT of the head with reconstructed 2-D images are also provided for review. Multiplanar 2D and/or 3D reformatted images are provided for review. Automated exposure control, iterative reconstruction, and/or weight based adjustment of the mA/kV was utilized to reduce the radiation dose to as low as reasonably achievable. COMPARISON: None available CLINICAL HISTORY: Neuro deficit, acute, stroke suspected. Weakness, s/p fall on concrete with head injury, evaluate for dissection per Neuro notes, wbc's 11.3, GFR>60, ct head code stroke 09/19/23 @ 2355; 75 ml omni 350 FINDINGS: CT HEAD: BRAIN AND VENTRICLES: No acute intracranial hemorrhage. No mass effect. No midline shift. No extra-axial fluid collection. No evidence of acute infarct. No hydrocephalus. ORBITS: No acute abnormality. SINUSES AND MASTOIDS: No acute abnormality. CTA NECK: AORTIC ARCH AND ARCH VESSELS: No dissection or arterial injury. Mild calcific aortic atherosclerosis. CERVICAL CAROTID ARTERIES: No dissection, arterial injury, or hemodynamically significant stenosis by NASCET criteria. Minimal atherosclerotic calcification of the left carotid bifurcation without hemodynamically significant stenosis. CERVICAL VERTEBRAL ARTERIES: No dissection, arterial injury, or significant stenosis. LUNGS AND MEDIASTINUM: Unremarkable. SOFT TISSUES: Large left frontal scalp hematoma. BONES: No acute abnormality. CTA HEAD: ANTERIOR CIRCULATION: No significant stenosis of the internal carotid arteries. No significant stenosis of the anterior cerebral arteries. No significant stenosis of the middle cerebral arteries. No aneurysm. POSTERIOR CIRCULATION: No significant stenosis of the posterior cerebral arteries. No significant stenosis of the basilar artery. No significant stenosis of the vertebral  arteries. No aneurysm. OTHER: No dural venous sinus thrombosis on this non-dedicated study. IMPRESSION: 1. No acute findings. 2. Large left frontal scalp hematoma. Electronically signed by: Franky Stanford MD 09/20/2023 03:12 AM EDT RP Workstation: [id number]   CT Maxillofacial Wo Contrast Result Date: 09/20/2023 CLINICAL DATA:  Facial trauma, blunt; Neuro deficit, acute, stroke suspected; Neck trauma, dangerous injury mechanism (Age 28-64y). Fall, head injury EXAM: CT HEAD WITHOUT CONTRAST CT MAXILLOFACIAL WITHOUT CONTRAST CT CERVICAL SPINE WITHOUT CONTRAST TECHNIQUE: Multidetector CT imaging of the head, cervical spine, and maxillofacial structures were performed using the standard protocol without intravenous contrast. Multiplanar CT image reconstructions of the cervical spine and maxillofacial structures were also generated. RADIATION DOSE REDUCTION: This exam was performed according to the departmental dose-optimization program which includes automated exposure control, adjustment of the mA and/or kV according to patient size and/or use of iterative reconstruction technique. COMPARISON:  None Available. FINDINGS: CT HEAD FINDINGS Brain: No evidence of acute infarction, hemorrhage, hydrocephalus, extra-axial collection or mass lesion/mass effect. Vascular: No hyperdense vessel  or unexpected calcification. Skull: Normal. Negative for fracture or focal lesion. Other: Mastoid air cells and middle ear cavities are clear. Large left supraorbital scalp hematoma. CT MAXILLOFACIAL FINDINGS Osseous: No fracture or mandibular dislocation. No destructive process. Orbits: Marked left preseptal soft tissue swelling. Ocular globes are intact. Ocular lenses are ortho topically position. No retro-orbital traumatic or inflammatory change identified. Sinuses: Clear. Soft tissues: Unremarkable CT CERVICAL SPINE FINDINGS Alignment: Normal. Skull base and vertebrae: No acute fracture. No primary bone lesion or focal pathologic  process. Soft tissues and spinal canal: No prevertebral fluid or swelling. No visible canal hematoma. Disc levels: Disc space narrowing and endplate remodeling of C5-C7 is present in keeping with changes of advanced degenerative disc disease. Prevertebral soft tissues are not thickened on sagittal reformats. No high-grade canal stenosis. No high-grade neuroforaminal narrowing. Upper chest: Negative. Other: None IMPRESSION: 1. No acute intracranial abnormality. No calvarial fracture. Large left supraorbital scalp hematoma. 2. No acute facial fracture. Marked left preseptal soft tissue swelling. 3. No acute fracture or listhesis of the cervical spine. Electronically Signed   By: Dorethia Molt M.D.   On: 09/20/2023 00:20   CT Cervical Spine Wo Contrast Result Date: 09/20/2023 CLINICAL DATA:  Facial trauma, blunt; Neuro deficit, acute, stroke suspected; Neck trauma, dangerous injury mechanism (Age 27-64y). Fall, head injury EXAM: CT HEAD WITHOUT CONTRAST CT MAXILLOFACIAL WITHOUT CONTRAST CT CERVICAL SPINE WITHOUT CONTRAST TECHNIQUE: Multidetector CT imaging of the head, cervical spine, and maxillofacial structures were performed using the standard protocol without intravenous contrast. Multiplanar CT image reconstructions of the cervical spine and maxillofacial structures were also generated. RADIATION DOSE REDUCTION: This exam was performed according to the departmental dose-optimization program which includes automated exposure control, adjustment of the mA and/or kV according to patient size and/or use of iterative reconstruction technique. COMPARISON:  None Available. FINDINGS: CT HEAD FINDINGS Brain: No evidence of acute infarction, hemorrhage, hydrocephalus, extra-axial collection or mass lesion/mass effect. Vascular: No hyperdense vessel or unexpected calcification. Skull: Normal. Negative for fracture or focal lesion. Other: Mastoid air cells and middle ear cavities are clear. Large left supraorbital scalp  hematoma. CT MAXILLOFACIAL FINDINGS Osseous: No fracture or mandibular dislocation. No destructive process. Orbits: Marked left preseptal soft tissue swelling. Ocular globes are intact. Ocular lenses are ortho topically position. No retro-orbital traumatic or inflammatory change identified. Sinuses: Clear. Soft tissues: Unremarkable CT CERVICAL SPINE FINDINGS Alignment: Normal. Skull base and vertebrae: No acute fracture. No primary bone lesion or focal pathologic process. Soft tissues and spinal canal: No prevertebral fluid or swelling. No visible canal hematoma. Disc levels: Disc space narrowing and endplate remodeling of C5-C7 is present in keeping with changes of advanced degenerative disc disease. Prevertebral soft tissues are not thickened on sagittal reformats. No high-grade canal stenosis. No high-grade neuroforaminal narrowing. Upper chest: Negative. Other: None IMPRESSION: 1. No acute intracranial abnormality. No calvarial fracture. Large left supraorbital scalp hematoma. 2. No acute facial fracture. Marked left preseptal soft tissue swelling. 3. No acute fracture or listhesis of the cervical spine. Electronically Signed   By: Dorethia Molt M.D.   On: 09/20/2023 00:20   CT HEAD CODE STROKE WO CONTRAST Result Date: 09/20/2023 CLINICAL DATA:  Facial trauma, blunt; Neuro deficit, acute, stroke suspected; Neck trauma, dangerous injury mechanism (Age 31-64y). Fall, head injury EXAM: CT HEAD WITHOUT CONTRAST CT MAXILLOFACIAL WITHOUT CONTRAST CT CERVICAL SPINE WITHOUT CONTRAST TECHNIQUE: Multidetector CT imaging of the head, cervical spine, and maxillofacial structures were performed using the standard protocol without intravenous contrast. Multiplanar CT image  reconstructions of the cervical spine and maxillofacial structures were also generated. RADIATION DOSE REDUCTION: This exam was performed according to the departmental dose-optimization program which includes automated exposure control, adjustment of  the mA and/or kV according to patient size and/or use of iterative reconstruction technique. COMPARISON:  None Available. FINDINGS: CT HEAD FINDINGS Brain: No evidence of acute infarction, hemorrhage, hydrocephalus, extra-axial collection or mass lesion/mass effect. Vascular: No hyperdense vessel or unexpected calcification. Skull: Normal. Negative for fracture or focal lesion. Other: Mastoid air cells and middle ear cavities are clear. Large left supraorbital scalp hematoma. CT MAXILLOFACIAL FINDINGS Osseous: No fracture or mandibular dislocation. No destructive process. Orbits: Marked left preseptal soft tissue swelling. Ocular globes are intact. Ocular lenses are ortho topically position. No retro-orbital traumatic or inflammatory change identified. Sinuses: Clear. Soft tissues: Unremarkable CT CERVICAL SPINE FINDINGS Alignment: Normal. Skull base and vertebrae: No acute fracture. No primary bone lesion or focal pathologic process. Soft tissues and spinal canal: No prevertebral fluid or swelling. No visible canal hematoma. Disc levels: Disc space narrowing and endplate remodeling of C5-C7 is present in keeping with changes of advanced degenerative disc disease. Prevertebral soft tissues are not thickened on sagittal reformats. No high-grade canal stenosis. No high-grade neuroforaminal narrowing. Upper chest: Negative. Other: None IMPRESSION: 1. No acute intracranial abnormality. No calvarial fracture. Large left supraorbital scalp hematoma. 2. No acute facial fracture. Marked left preseptal soft tissue swelling. 3. No acute fracture or listhesis of the cervical spine. Electronically Signed   By: Dorethia Molt M.D.   On: 09/20/2023 00:20     Procedures   Medications Ordered in the ED - No data to display  Clinical Course as of 09/20/23 0323  Wed Sep 20, 2023  0129 Reassessed, headache feeling better.  [JB]  0129 Per telemetry neurology.  Patient is in need of CT angio head and neck.  Will need  admission for stroke rule out and MRI. [JB]    Clinical Course User Index [JB] Clea Dubach, Warren SAILOR, PA-C                                 Medical Decision Making Amount and/or Complexity of Data Reviewed Labs: ordered. Radiology: ordered.  Risk Prescription drug management. Decision regarding hospitalization.   This patient presents to the ED for concern of fall, this involves an extensive number of treatment options, and is a complaint that carries with it a high risk of complications and morbidity.  The differential diagnosis includes intracranial hemorrhage, subdural/epidural hematoma, vertebral fracture, spinal cord injury, muscle strain, skull fracture, fracture, splenic injury, liver injury, perforated viscus, contusions.   Lab Tests:  I personally interpreted labs.  The pertinent results include:   CBC shows mild leukocytosis.  No significant anemia.  CMP unremarkable.  EtOH normal.  Urine drug screen negative.  Pregnancy test negative.   Imaging Studies ordered:  I ordered imaging studies including head CT.  CT angio head and neck.  CT maxillofacial, CT cervical spine. I independently visualized and interpreted imaging which showed no acute findings. I agree with the radiologist interpretation   Cardiac Monitoring: / EKG:  The patient was maintained on a cardiac monitor.  EKG shows normal sinus rhythm.   Problem List / ED Course / Critical interventions / Medication management  Patient presents to emergency room after fall.  Patient had mechanical fall striking her head against the pavement.  Since then she has had inability to walk,  leg weakness, confusion, headache.  On my initial examination she is unable to hold bilateral lower extremities up against gravity.  Sensation is intact.  She otherwise has reassuring neurological exam.  She is an alert and oriented x 4.  On reassessment shortly after the left leg weakness had resolved.  She continues to have right leg  weakness.  For this reason code stroke was called.  Neurology was consulted who is recommending admission for stroke rule out with MRI of the brain.  Her workup of CT head cervical spine maxillofacial and CT angio head and neck is overall reassuring.  Does show scalp hematoma.  Patient hemodynamically stable.  Feeling better after migraine cocktail.  Will check to the hospital team for admission.        Final diagnoses:  Injury of head, initial encounter  Right leg weakness    ED Discharge Orders     None          Shermon Warren SAILOR, PA-C 09/20/23 0341    Griselda Norris, MD 09/21/23 [phone]

## 2023-09-20 NOTE — H&P (Addendum)
 History and Physical    Maureen [name] FMW:[id number] DOB: 1970-07-28 DOA: 09/19/2023  PCP: Valma Carwin, MD   Patient coming from: Home   Chief Complaint:  Chief Complaint  Patient presents with   Head Injury   ED TRIAGE note: Pt fell 20 minutes PTA, hit head on concrete. Pt was pulled by a dog while walking it. No LOC, no blood thinners. Large hematoma to left eyebrow. Pt is confused and states she does not know her daughters who are with her, unable to tell me her name            HPI:  Maureen [name] is a 53 y.o. female past medical history of essential hypertension, generalized anxiety disorder and hypothyroidism presented for a fall.  Patient directly fell on the concrete hitting on the head and since the episode patient complaining about right sided lower extremity weakness started started around 8:30 PM last night 9/2.  Patient was walking dog outside when another dog ran into her and she slipped and fell face down directly hit the head on the concrete.  Patient also lost consciousness for few seconds.  Family called ambulance and reported that patient feeling right-sided leg weakness.  No previous history of stroke.  Denies any pain.  In the ED patient found to have left eyebrow large hematoma.   ED Course:  At presentation to ED patient found hypertensive blood pressure upper 190s range which has improved to 152/92.  Hemodynamically stable.  Lab work, CBC showing leukocytosis 11.3 otherwise unremarkable. CMP unremarkable.  Normal blood alcohol level.  UDS negative.  Pregnancy test negative.  Normal APTT INR.  CT angio head and neck no acute finding.  Large left frontal scalp hematoma. CT cervical spine no evidence of fracture dislocation. CT head no intracranial bleeding or abnormality. CT maxillofacial no facial bone fracture.  Marked left preseptal soft tissue swelling.  No acute facial fracture.  Pending MRI of the brain.  In the ED patient has been already  evaluated by neurology for evaluation of right sided lower extremity weakness.  Neurology discussed patient benefit from TNK however in the setting of hematoma and risk of bleeding patient declined.  Neurology recommended MRI of the brain without contrast and CTA head and neck to rule out any dissection. Neurology recommendation following  Recommendations:  - SBP goals 120-160  - MRI brain wo contrast  - CTA head/neck to rule out dissection  Dr. Griselda reported that during her evaluation patient had both upper lower extremity weakness however when neurology evaluated patient she has only right sided lower extremity weakness.  Per discussion with ED physician it is hard to decide if patient has a stroke vs postconcussion right sided weakness.  Hospitalist has been consulted for further evaluation management of right-sided weakness postconcussion.   Significant labs in the ED: Lab Orders         Ethanol         Urine Drug Screen         CBC with Differential         Comprehensive metabolic panel         Pregnancy, urine         Protime-INR         APTT         CBC         Basic metabolic panel         Lipid panel         Hemoglobin A1c  I-stat chem 8, ED       Review of Systems:  Review of Systems  Constitutional:  Negative for chills, fever, malaise/fatigue and weight loss.  Eyes:  Negative for blurred vision, double vision and photophobia.  Cardiovascular:  Negative for chest pain.  Gastrointestinal:  Negative for abdominal pain.  Musculoskeletal:  Positive for falls. Negative for back pain, joint pain and neck pain.  Neurological:  Positive for loss of consciousness and weakness. Negative for dizziness, tingling, tremors, sensory change, speech change, focal weakness, seizures and headaches.  Psychiatric/Behavioral:  The patient is not nervous/anxious.     Past Medical History:  Diagnosis Date   Abdominal pain    Anxiety    Back pain    Constipation    Depression     Dyspnea    GERD (gastroesophageal reflux disease)    HTN (hypertension)    Hypothyroidism    Lactose intolerance    Vaginal bleeding, abnormal     Past Surgical History:  Procedure Laterality Date   CERCLAGE LAPAROSCOPIC ABDOMINAL     [phone]     reports that she has never smoked. She has never used smokeless tobacco. No history on file for alcohol use and drug use.  Allergies  Allergen Reactions   Lactose Intolerance (Gi)    Vicodin [Hydrocodone-Acetaminophen] Nausea And Vomiting    Family History  Problem Relation Age of Onset   Hypertension Mother    Thyroid  disease Mother    Depression Mother    Anxiety disorder Mother     Prior to Admission medications   Medication Sig Start Date End Date Taking? Authorizing Provider  amLODipine  (NORVASC ) 5 MG tablet Take 5 mg by mouth daily.    [provider]  levothyroxine  (SYNTHROID ) 100 MCG tablet Take 1 tablet (100 mcg total) by mouth daily before breakfast. 11/20/18   Whitmire, Dawn, FNP  norethindrone-ethinyl estradiol  (JUNEL FE,GILDESS FE,LOESTRIN FE) 1-20 MG-MCG tablet Take 1 tablet by mouth daily.    [provider]  traZODone  (DESYREL ) 50 MG tablet Take 1 tablet (50 mg total) by mouth at bedtime. 07/01/19   Whitmire, Stephane, FNP     Physical Exam: Vitals:   09/20/23 0000 09/20/23 0030 09/20/23 0210 09/20/23 0330  BP: (!) 171/106 (!) 181/110 (!) 152/92 104/88  Pulse: 72 79 73 76  Resp: 18 (!) 21 19 15   Temp:  98.5 F (36.9 C) 98.4 F (36.9 C)   SpO2: 97% 98% 99% 99%  Weight:      Height:        Physical Exam Vitals and nursing note reviewed.  Constitutional:      General: She is not in acute distress.    Appearance: She is not ill-appearing.  HENT:     Head: Normocephalic.     Comments: Left-sided frontal scalp hematoma around 2 cm and left-sided upper and lower eyelid bruising. Eyes:     Extraocular Movements: Extraocular movements intact.     Conjunctiva/sclera: Conjunctivae  normal.     Pupils: Pupils are equal, round, and reactive to light.  Cardiovascular:     Rate and Rhythm: Normal rate and regular rhythm.     Pulses: Normal pulses.     Heart sounds: Normal heart sounds.  Pulmonary:     Effort: Pulmonary effort is normal.     Breath sounds: Normal breath sounds.  Abdominal:     Palpations: Abdomen is soft.  Musculoskeletal:        General: No tenderness.     Cervical  back: Neck supple.     Right lower leg: No edema.     Left lower leg: No edema.  Skin:    Capillary Refill: Capillary refill takes less than 2 seconds.  Neurological:     Mental Status: She is alert and oriented to person, place, and time.     Cranial Nerves: No cranial nerve deficit.     Sensory: No sensory deficit.     Motor: No weakness.     Coordination: Coordination normal.     Gait: Gait normal.     Deep Tendon Reflexes: Reflexes normal.  Psychiatric:        Mood and Affect: Mood normal.        Behavior: Behavior normal.        Thought Content: Thought content normal.        Judgment: Judgment normal.      Labs on Admission: I have personally reviewed following labs and imaging studies  CBC: Recent Labs  Lab 09/19/23 2124 09/19/23 2339 09/20/23 0416  WBC 11.3*  --  12.2*  NEUTROABS 6.2  --   --   HGB 13.2 14.6 12.7  HCT 42.8 43.0 40.8  MCV 85.3  --  84.3  PLT 272  --  260   Basic Metabolic Panel: Recent Labs  Lab 09/19/23 2124 09/19/23 2339  NA 135 135  K 3.8 3.8  CL 100 101  CO2 23  --   GLUCOSE 84 111*  BUN 16 15  CREATININE 0.82 0.80  CALCIUM  9.5  --    GFR: Estimated Creatinine Clearance: 93.8 mL/min (by C-G formula based on SCr of 0.8 mg/dL). Liver Function Tests: Recent Labs  Lab 09/19/23 2124  AST 26  ALT 17  ALKPHOS 91  BILITOT 0.7  PROT 7.9  ALBUMIN 4.6   No results for input(s): LIPASE, AMYLASE in the last 168 hours. No results for input(s): AMMONIA in the last 168 hours. Coagulation Profile: Recent Labs  Lab  09/20/23 0006  INR 0.9   Cardiac Enzymes: No results for input(s): CKTOTAL, CKMB, CKMBINDEX, TROPONINI, TROPONINIHS in the last 168 hours. BNP (last 3 results) No results for input(s): BNP in the last 8760 hours. HbA1C: No results for input(s): HGBA1C in the last 72 hours. CBG: No results for input(s): GLUCAP in the last 168 hours. Lipid Profile: No results for input(s): CHOL, HDL, LDLCALC, TRIG, CHOLHDL, LDLDIRECT in the last 72 hours. Thyroid  Function Tests: No results for input(s): TSH, T4TOTAL, FREET4, T3FREE, THYROIDAB in the last 72 hours. Anemia Panel: No results for input(s): VITAMINB12, FOLATE, FERRITIN, TIBC, IRON, RETICCTPCT in the last 72 hours. Urine analysis: No results found for: COLORURINE, APPEARANCEUR, LABSPEC, PHURINE, GLUCOSEU, HGBUR, BILIRUBINUR, KETONESUR, PROTEINUR, UROBILINOGEN, NITRITE, LEUKOCYTESUR  Radiological Exams on Admission: I have personally reviewed images CT Angio Head Neck W WO CM (CODE STROKE) Result Date: 09/20/2023 EXAM: CT HEAD WITHOUT CTA HEAD AND NECK WITH AND WITHOUT 09/20/2023 03:06:04 AM TECHNIQUE: CTA of the head and neck was performed with and without the administration of intravenous contrast. Noncontrast CT of the head with reconstructed 2-D images are also provided for review. Multiplanar 2D and/or 3D reformatted images are provided for review. Automated exposure control, iterative reconstruction, and/or weight based adjustment of the mA/kV was utilized to reduce the radiation dose to as low as reasonably achievable. COMPARISON: None available CLINICAL HISTORY: Neuro deficit, acute, stroke suspected. Weakness, s/p fall on concrete with head injury, evaluate for dissection per Neuro notes, wbc's 11.3, GFR>60, ct head code stroke  09/19/23 @ 2355; 75 ml omni 350 FINDINGS: CT HEAD: BRAIN AND VENTRICLES: No acute intracranial hemorrhage. No mass effect. No midline shift. No  extra-axial fluid collection. No evidence of acute infarct. No hydrocephalus. ORBITS: No acute abnormality. SINUSES AND MASTOIDS: No acute abnormality. CTA NECK: AORTIC ARCH AND ARCH VESSELS: No dissection or arterial injury. Mild calcific aortic atherosclerosis. CERVICAL CAROTID ARTERIES: No dissection, arterial injury, or hemodynamically significant stenosis by NASCET criteria. Minimal atherosclerotic calcification of the left carotid bifurcation without hemodynamically significant stenosis. CERVICAL VERTEBRAL ARTERIES: No dissection, arterial injury, or significant stenosis. LUNGS AND MEDIASTINUM: Unremarkable. SOFT TISSUES: Large left frontal scalp hematoma. BONES: No acute abnormality. CTA HEAD: ANTERIOR CIRCULATION: No significant stenosis of the internal carotid arteries. No significant stenosis of the anterior cerebral arteries. No significant stenosis of the middle cerebral arteries. No aneurysm. POSTERIOR CIRCULATION: No significant stenosis of the posterior cerebral arteries. No significant stenosis of the basilar artery. No significant stenosis of the vertebral arteries. No aneurysm. OTHER: No dural venous sinus thrombosis on this non-dedicated study. IMPRESSION: 1. No acute findings. 2. Large left frontal scalp hematoma. Electronically signed by: Franky Stanford MD 09/20/2023 03:12 AM EDT RP Workstation: [id number]   CT Maxillofacial Wo Contrast Result Date: 09/20/2023 CLINICAL DATA:  Facial trauma, blunt; Neuro deficit, acute, stroke suspected; Neck trauma, dangerous injury mechanism (Age 46-64y). Fall, head injury EXAM: CT HEAD WITHOUT CONTRAST CT MAXILLOFACIAL WITHOUT CONTRAST CT CERVICAL SPINE WITHOUT CONTRAST TECHNIQUE: Multidetector CT imaging of the head, cervical spine, and maxillofacial structures were performed using the standard protocol without intravenous contrast. Multiplanar CT image reconstructions of the cervical spine and maxillofacial structures were also generated. RADIATION DOSE  REDUCTION: This exam was performed according to the departmental dose-optimization program which includes automated exposure control, adjustment of the mA and/or kV according to patient size and/or use of iterative reconstruction technique. COMPARISON:  None Available. FINDINGS: CT HEAD FINDINGS Brain: No evidence of acute infarction, hemorrhage, hydrocephalus, extra-axial collection or mass lesion/mass effect. Vascular: No hyperdense vessel or unexpected calcification. Skull: Normal. Negative for fracture or focal lesion. Other: Mastoid air cells and middle ear cavities are clear. Large left supraorbital scalp hematoma. CT MAXILLOFACIAL FINDINGS Osseous: No fracture or mandibular dislocation. No destructive process. Orbits: Marked left preseptal soft tissue swelling. Ocular globes are intact. Ocular lenses are ortho topically position. No retro-orbital traumatic or inflammatory change identified. Sinuses: Clear. Soft tissues: Unremarkable CT CERVICAL SPINE FINDINGS Alignment: Normal. Skull base and vertebrae: No acute fracture. No primary bone lesion or focal pathologic process. Soft tissues and spinal canal: No prevertebral fluid or swelling. No visible canal hematoma. Disc levels: Disc space narrowing and endplate remodeling of C5-C7 is present in keeping with changes of advanced degenerative disc disease. Prevertebral soft tissues are not thickened on sagittal reformats. No high-grade canal stenosis. No high-grade neuroforaminal narrowing. Upper chest: Negative. Other: None IMPRESSION: 1. No acute intracranial abnormality. No calvarial fracture. Large left supraorbital scalp hematoma. 2. No acute facial fracture. Marked left preseptal soft tissue swelling. 3. No acute fracture or listhesis of the cervical spine. Electronically Signed   By: Dorethia Molt M.D.   On: 09/20/2023 00:20   CT Cervical Spine Wo Contrast Result Date: 09/20/2023 CLINICAL DATA:  Facial trauma, blunt; Neuro deficit, acute, stroke  suspected; Neck trauma, dangerous injury mechanism (Age 10-64y). Fall, head injury EXAM: CT HEAD WITHOUT CONTRAST CT MAXILLOFACIAL WITHOUT CONTRAST CT CERVICAL SPINE WITHOUT CONTRAST TECHNIQUE: Multidetector CT imaging of the head, cervical spine, and maxillofacial structures were performed using the standard protocol  without intravenous contrast. Multiplanar CT image reconstructions of the cervical spine and maxillofacial structures were also generated. RADIATION DOSE REDUCTION: This exam was performed according to the departmental dose-optimization program which includes automated exposure control, adjustment of the mA and/or kV according to patient size and/or use of iterative reconstruction technique. COMPARISON:  None Available. FINDINGS: CT HEAD FINDINGS Brain: No evidence of acute infarction, hemorrhage, hydrocephalus, extra-axial collection or mass lesion/mass effect. Vascular: No hyperdense vessel or unexpected calcification. Skull: Normal. Negative for fracture or focal lesion. Other: Mastoid air cells and middle ear cavities are clear. Large left supraorbital scalp hematoma. CT MAXILLOFACIAL FINDINGS Osseous: No fracture or mandibular dislocation. No destructive process. Orbits: Marked left preseptal soft tissue swelling. Ocular globes are intact. Ocular lenses are ortho topically position. No retro-orbital traumatic or inflammatory change identified. Sinuses: Clear. Soft tissues: Unremarkable CT CERVICAL SPINE FINDINGS Alignment: Normal. Skull base and vertebrae: No acute fracture. No primary bone lesion or focal pathologic process. Soft tissues and spinal canal: No prevertebral fluid or swelling. No visible canal hematoma. Disc levels: Disc space narrowing and endplate remodeling of C5-C7 is present in keeping with changes of advanced degenerative disc disease. Prevertebral soft tissues are not thickened on sagittal reformats. No high-grade canal stenosis. No high-grade neuroforaminal narrowing. Upper  chest: Negative. Other: None IMPRESSION: 1. No acute intracranial abnormality. No calvarial fracture. Large left supraorbital scalp hematoma. 2. No acute facial fracture. Marked left preseptal soft tissue swelling. 3. No acute fracture or listhesis of the cervical spine. Electronically Signed   By: Dorethia Molt M.D.   On: 09/20/2023 00:20   CT HEAD CODE STROKE WO CONTRAST Result Date: 09/20/2023 CLINICAL DATA:  Facial trauma, blunt; Neuro deficit, acute, stroke suspected; Neck trauma, dangerous injury mechanism (Age 76-64y). Fall, head injury EXAM: CT HEAD WITHOUT CONTRAST CT MAXILLOFACIAL WITHOUT CONTRAST CT CERVICAL SPINE WITHOUT CONTRAST TECHNIQUE: Multidetector CT imaging of the head, cervical spine, and maxillofacial structures were performed using the standard protocol without intravenous contrast. Multiplanar CT image reconstructions of the cervical spine and maxillofacial structures were also generated. RADIATION DOSE REDUCTION: This exam was performed according to the departmental dose-optimization program which includes automated exposure control, adjustment of the mA and/or kV according to patient size and/or use of iterative reconstruction technique. COMPARISON:  None Available. FINDINGS: CT HEAD FINDINGS Brain: No evidence of acute infarction, hemorrhage, hydrocephalus, extra-axial collection or mass lesion/mass effect. Vascular: No hyperdense vessel or unexpected calcification. Skull: Normal. Negative for fracture or focal lesion. Other: Mastoid air cells and middle ear cavities are clear. Large left supraorbital scalp hematoma. CT MAXILLOFACIAL FINDINGS Osseous: No fracture or mandibular dislocation. No destructive process. Orbits: Marked left preseptal soft tissue swelling. Ocular globes are intact. Ocular lenses are ortho topically position. No retro-orbital traumatic or inflammatory change identified. Sinuses: Clear. Soft tissues: Unremarkable CT CERVICAL SPINE FINDINGS Alignment: Normal. Skull  base and vertebrae: No acute fracture. No primary bone lesion or focal pathologic process. Soft tissues and spinal canal: No prevertebral fluid or swelling. No visible canal hematoma. Disc levels: Disc space narrowing and endplate remodeling of C5-C7 is present in keeping with changes of advanced degenerative disc disease. Prevertebral soft tissues are not thickened on sagittal reformats. No high-grade canal stenosis. No high-grade neuroforaminal narrowing. Upper chest: Negative. Other: None IMPRESSION: 1. No acute intracranial abnormality. No calvarial fracture. Large left supraorbital scalp hematoma. 2. No acute facial fracture. Marked left preseptal soft tissue swelling. 3. No acute fracture or listhesis of the cervical spine. Electronically Signed   By: Dorethia  Kimberlee M.D.   On: 09/20/2023 00:20     EKG: My personal interpretation of EKG shows: Normal sinus rhythm heart rate 75    Assessment/Plan: Principal Problem:   Right sided weakness Active Problems:   Concussion with brief LOC   Essential hypertension   GAD (generalized anxiety disorder)   Scalp hematoma   History of hypothyroidism   Postmenopausal syndrome    Assessment and Plan: Right sided upper and lower extremity weakness-resolved differential acute stroke vs postconcussion syndrome Mechanical fall and hitting of the head on concrete Left-sided scalp frontal hematoma-secondary to mechanical fall Left-sided eyelid bruising from scalp hematoma and fall -Presented to emergency department mechanical fall and landed face directed hitting the head on the concrete followed by loss of consciousness for few seconds.  Since then patient right-sided upper and lower extremity weakness that started at 8 PM 9/2.   Right-sided weakness has been resolved which lasted for 4 hours.   Patient reported brief loss of consciousness for few seconds after the fall and then she walked to home by herself.  Denies any tremor, tingling, sensory change,  vision change, and seizure. - At presentation to ED patient found borderline hypertensive after her blood pressure improved. - CBC showing mild cytosis.  BMP unremarkable. - CT head, CT cervical spine, CT maxillofacial and CT head and neck unremarkable. - In the ED patient being evaluated by neurology acute stroke discussed with patient for treatment with TNK however in the setting of hematoma and high risk of bleeding patient declined. - Neurology recommended admit patient for stroke workup, get MRI of the brain without contrast and CTA head and neck to rule out dissection.  No antiplatelet therapy recommendation yet as pending MRI. - Right sided weakness differential is acute stroke versus postconcussion syndrome -NISHH stroke scale is 0. - Holding any antiplatelet therapy would follow-up with MRI of the brain first. -Continue neurocheck every 4 hours, obtain echocardiogram.  -Obtaining MRI of the brain.  If MRI shows any evidence of CVA need to inform on-call neurology for further recommendation.  Obtaining echocardiogram, continue cardiac monitor for development of any arrhythmia, checking A1c and lipid panel. -Informed on-call Neurology Dr. Arora will add this patient on the stroke list team. - Consulted PT and OT.   History of essential hypertension -At presentation to ED systolic blood pressure 130s range and pain improved after IV morphine . -Patient reported she does not take any blood pressure regimen-amlodipine  anymore. -Neurology recommended blood pressure goal in between 120-160 - Continue IV hydralazine  as needed for SBP above 160 due to BP above 100.  Hypothyroidism -Continue levothyroxine   Generalized anxiety disorder - Continue Celexa   Postmenopausal syndrome - Patient is aware estradiol  patch twice weekly.  Recommended to remove it as of now until we rule out acute CVA.   DVT prophylaxis:  SCDs.  Deferring pharmacological DVT prophylaxis until MRI will be  resulted. Code Status:  Full Code Diet: Heart healthy diet Family Communication:   Family was present at bedside, at the time of interview. Opportunity was given to ask question and all questions were answered satisfactorily.  Disposition Plan: Need to follow-up with MRI results and neurocheck. Consults: Neurology Admission status:   Inpatient, Telemetry bed  Severity of Illness: The appropriate patient status for this patient is INPATIENT. Inpatient status is judged to be reasonable and necessary in order to provide the required intensity of service to ensure the patient's safety. The patient's presenting symptoms, physical exam findings, and initial radiographic and laboratory data  in the context of their chronic comorbidities is felt to place them at high risk for further clinical deterioration. Furthermore, it is not anticipated that the patient will be medically stable for discharge from the hospital within 2 midnights of admission.   * I certify that at the point of admission it is my clinical judgment that the patient will require inpatient hospital care spanning beyond 2 midnights from the point of admission due to high intensity of service, high risk for further deterioration and high frequency of surveillance required.DEWAINE    Naava Janeway, MD [facility]  How to contact the [facility] Attending or Consulting provider 7A - 7P or covering provider during after hours 7P -7A, for this patient.  Check the care team in [facility] and look for a) attending/consulting [facility] provider listed and b) the [facility] team listed Log into [email or web address] and use [place]'s universal password to access. If you do not have the password, please contact the hospital operator. Locate the [facility] provider you are looking for under [facility] and page to a number that you can be directly reached. If you still have difficulty reaching the provider, please page the [facility] (Director on Call) for the Hospitalists listed on  amion for assistance.  09/20/2023, 5:09 AM

## 2023-09-21 NOTE — ED Notes (Addendum)
 Patient was discharged at 1623 on 09/20/2023. During that time pending was selected instead of discharge. Patient left after medication was delivered from outpatient pharmacy.

## 2023-10-09 ENCOUNTER — Ambulatory Visit: Admitting: Professional

## 2023-11-01 ENCOUNTER — Ambulatory Visit: Admitting: Professional

## 2023-11-22 ENCOUNTER — Ambulatory Visit: Admitting: Professional

## 2023-11-22 ENCOUNTER — Encounter: Payer: Self-pay | Admitting: Professional

## 2023-11-22 DIAGNOSIS — F988 Other specified behavioral and emotional disorders with onset usually occurring in childhood and adolescence: Secondary | ICD-10-CM

## 2023-11-22 DIAGNOSIS — F331 Major depressive disorder, recurrent, moderate: Secondary | ICD-10-CM | POA: Diagnosis not present

## 2023-11-22 DIAGNOSIS — F411 Generalized anxiety disorder: Secondary | ICD-10-CM | POA: Diagnosis not present

## 2023-11-22 DIAGNOSIS — F4310 Post-traumatic stress disorder, unspecified: Secondary | ICD-10-CM | POA: Diagnosis not present

## 2023-11-22 NOTE — Progress Notes (Signed)
 [name] Counselor/Therapist Progress Note  Patient ID: Maureen [name], MRN: [id number],    Date: 11/22/2023  Time Spent:  40 minutes 1109-1149am  Treatment Type: Individual Therapy  Risk Assessment: Danger to Self:  No Self-injurious Behavior: No Danger to Others: No  Subjective: This session was held via video teletherapy. The patient consented to video teletherapy and was located in her car during this session. She is aware it is the responsibility of the patient to secure confidentiality on her end of the session. The provider was in a private home office for the duration of this session.   The patient arrived late for her Caregility appointment appointment.  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 2-physical a-had a really bad fall when walking [place] -the neighbor's dog Maureen [name] was out and not leashed; she knows the electric fence was broken because the wife  -head slammed on ground -pt panicked and walked home -911 did not respond and her daughter took her to [employer] -pt was not responding, could not talk, and was struggling to walk -she could not remember daughter or anyone -she had a complete workup -everything was normal, no bleeding on brain, no stroke, by next morning talking and able to move -admitted to hospital and was not able to return to work for two weeks having exhausted all her PTO -Friday after she got out of hospital and she had her daughter go up -neighbor came and saw her and thanked them for letting them know -the neighbors never came back to check on her -she has two bills for $500 -pt stated she has to be brave and she has printed out the bills and plans to go to the neighbors -she does not want to use an attorney and give the neighbors -that little dog  caused a whole lot of chaos -the owner told Maureen [name] a week after the fall that they got the fence fixed 3-flashbacks -is having flashbacks of her fall and it's very disturbing  -the flashbacks happen everyday when she has to take Maureen [name] out -she has been having some nightmares but doesn't remember what is happening -it's been two months and she still is having headaches and still has a bump above her left eyebrow and it hurt to touch still  Treatment Plan Problems Addressed  Anxiety, Depression Goals 1. Engage in healthy reciprocal dating relationship 2. Enhance ability to effectively cope with the full variety of life's worries and anxieties. Objective Complete a Cost Benefit Analysis of maintaining the anxiety. Target Date: 2024-02-21 Frequency: Biweekly  Progress: 0 Modality: individual  Related Interventions Ask the client to evaluate the costs and benefits of worries (e.g., complete the Cost Benefit Analysis exercise in Ten Days to Self-Esteem! by Maureen [name]) in which he/she lists the advantages and disadvantages of the negative thought, fear, or anxiety; process the completed assignment. Objective Describe situations, thoughts, feelings, and actions associated with anxieties and worries, their impact on functioning, and attempts to resolve them. Target Date: 2024-02-21 Frequency: Biweekly  Progress: 50 Modality: individual  Related Interventions Focus on developing a level of trust with the client; provide support and empathy to encourage the client to feel safe in expressing his/her GAD symptoms. Ask the client to describe his/her past experiences of anxiety and their impact on functioning; assess the focus, excessiveness, and  uncontrollability of the worry and the type, frequency, intensity, and duration of his/her anxiety symptoms (consider using a structured interview such as The Anxiety Disorders Interview Schedule-Adult Version). Objective Identify the major life conflicts from the  past and present that form the basis for present anxiety. Target Date: 2024-02-21 Frequency: Biweekly  Progress: 80 Modality: individual  Related Interventions Ask the client to develop and process a list of key past and present life conflicts that continue to cause worry. Reinforce the client's insights into the role of his/her past emotional pain and present anxiety. Assist the client in becoming aware of key unresolved life conflicts and in starting to work toward their resolution. Objective Actively evaluate dating relationships to ensure honest evaluation of healthy vs unheathy behaviors. Target Date: 2024-02-21 Frequency: Biweekly  Progress: 40 Modality: individual  Related Interventions Assist pt in evaluating values-based dating relationships so that patient achieves her desires for a lifelong partner. 3. Identify and increase intrapersonal, interpersonal, and physical resources to foster positive coping strategies. 4. Learn and implement coping skills that result in a reduction of anxiety and worry, and improved daily functioning. 5. Resolve the core conflict that is the source of anxiety. Objective Maintain involvement in work, family, and social activities. Target Date: 2024-02-21 Frequency: Biweekly  Progress: 40 Modality: individual  Related Interventions Support the client in following through with work, family, and social activities rather than escaping or avoiding them to focus on anxiety.  Diagnosis:Major depressive disorder, recurrent episode, moderate (HCC)  Generalized anxiety disorder  Adult attention deficit disorder  PTSD (post-traumatic stress disorder)  Plan:  -pt plans to address financial and physical health issues that have occurred as result of fall -meet again on Monday, December 11, 2023 at 8am.

## 2023-12-11 ENCOUNTER — Ambulatory Visit: Admitting: Professional

## 2023-12-11 ENCOUNTER — Encounter: Payer: Self-pay | Admitting: Professional

## 2023-12-11 NOTE — Progress Notes (Signed)
 A user error has taken place: encounter opened in error, closed for administrative reasons.               Maureen [name], [facility]

## 2023-12-13 ENCOUNTER — Ambulatory Visit: Admitting: Professional

## 2024-01-03 ENCOUNTER — Encounter: Payer: Self-pay | Admitting: Professional

## 2024-01-03 ENCOUNTER — Ambulatory Visit: Admitting: Professional

## 2024-01-03 DIAGNOSIS — F988 Other specified behavioral and emotional disorders with onset usually occurring in childhood and adolescence: Secondary | ICD-10-CM | POA: Diagnosis not present

## 2024-01-03 DIAGNOSIS — F4310 Post-traumatic stress disorder, unspecified: Secondary | ICD-10-CM | POA: Diagnosis not present

## 2024-01-03 DIAGNOSIS — F411 Generalized anxiety disorder: Secondary | ICD-10-CM

## 2024-01-03 DIAGNOSIS — F331 Major depressive disorder, recurrent, moderate: Secondary | ICD-10-CM

## 2024-01-03 NOTE — Progress Notes (Signed)
 [facility] Counselor/Therapist Progress Note  Patient ID: Maureen [name], MRN: [id number],    Date: 01/03/2024  Time Spent:  33 minutes 1105-1138am  Treatment Type: Individual Therapy  Risk Assessment: Danger to Self:  No Self-injurious Behavior: No Danger to Others: No  Subjective: This session was held via video teletherapy. The patient consented to video teletherapy and was located in her car during this session. She is aware it is the responsibility of the patient to secure confidentiality on her end of the session. The provider was in a private home office for the duration of this session.   The patient arrived late for her Caregility appointment appointment.  Issues addressed: 1-Thanksgiving with parents, Christmas in [place] with adult children/granddaughter -pt will be working throughout [employer] season 2-professional -she can no longer work consistently because when looking at the computer her eyes are getting very tired; this did not happen before her fall and hitting her head on the concrete -she is having to leave her computer more frequently than before the fall 2-physical a-patient still have throbbing headaches 3-mental health -has flashbacks everytime she walks in the neighborhood -pt sees herself falling -very tense as she is always trying to be so cautious -it has not stopped her from walking [place] -that part is the worst part it makes me afraid -it was by the grace of God pt recognizes it could be so much worse 4-self-care -I made this my year to take care of myself -she paid off her [facility] and it helped her breathe again -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[employer] -still doing her monthly float -plans to save some money and get her house together -she plans to get another home in the area and not to to the beach -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 Engage in healthy reciprocal dating relationship 2. Enhance ability to effectively cope with the full variety of life's worries and anxieties. Objective Complete a Cost Benefit Analysis of maintaining the anxiety. Target Date: 2024-02-21 Frequency: Biweekly  Progress: 0 Modality: individual  Related Interventions Ask the client to evaluate the costs and benefits of worries (e.g., complete the Cost Benefit Analysis exercise in Ten Days to Self-Esteem! by Geofm) in which he/she lists the advantages and disadvantages of the negative thought, fear, or anxiety; process the completed assignment. Objective Describe situations, thoughts, feelings, and actions associated with anxieties and worries, their impact on functioning, and attempts to resolve them. Target Date: 2024-02-21 Frequency: Biweekly  Progress: 50 Modality: individual  Related Interventions Focus on developing a level of trust with the client; provide support and empathy to encourage the client to feel safe in expressing his/her GAD symptoms. Ask the client to describe his/her past experiences of anxiety and their impact on functioning; assess the focus, excessiveness, and uncontrollability of the worry and the type, frequency, intensity, and duration  of his/her anxiety symptoms (consider using a structured interview such as The Anxiety Disorders Interview Schedule-Adult Version). Objective Identify the major life conflicts  from the past and present that form the basis for present anxiety. Target Date: 2024-02-21 Frequency: Biweekly  Progress: 80 Modality: individual  Related Interventions Ask the client to develop and process a list of key past and present life conflicts that continue to cause worry. Reinforce the client's insights into the role of his/her past emotional pain and present anxiety. Assist the client in becoming aware of key unresolved life conflicts and in starting to work toward their resolution. Objective Actively evaluate dating relationships to ensure honest evaluation of healthy vs unheathy behaviors. Target Date: 2024-02-21 Frequency: Biweekly  Progress: 40 Modality: individual  Related Interventions Assist pt in evaluating values-based dating relationships so that patient achieves her desires for a lifelong partner. 3. Identify and increase intrapersonal, interpersonal, and physical resources to foster positive coping strategies. 4. Learn and implement coping skills that result in a reduction of anxiety and worry, and improved daily functioning. 5. Resolve the core conflict that is the source of anxiety. Objective Maintain involvement in work, family, and social activities. Target Date: 2024-02-21 Frequency: Biweekly  Progress: 40 Modality: individual  Related Interventions Support the client in following through with work, family, and social activities rather than escaping or avoiding them to focus on anxiety.  Diagnosis:Major depressive disorder, recurrent episode, moderate (HCC)  Generalized anxiety disorder  Adult attention deficit disorder  PTSD (post-traumatic stress disorder)  Plan:   -meet again on Wednesday, January 24, 2024 at 10am.

## 2024-01-08 ENCOUNTER — Other Ambulatory Visit (HOSPITAL_COMMUNITY): Payer: Self-pay

## 2024-01-24 ENCOUNTER — Ambulatory Visit: Admitting: Professional

## 2024-01-26 ENCOUNTER — Ambulatory Visit: Admitting: Professional

## 2024-01-26 ENCOUNTER — Encounter: Payer: Self-pay | Admitting: Professional

## 2024-01-26 DIAGNOSIS — F4310 Post-traumatic stress disorder, unspecified: Secondary | ICD-10-CM | POA: Diagnosis not present

## 2024-01-26 DIAGNOSIS — F988 Other specified behavioral and emotional disorders with onset usually occurring in childhood and adolescence: Secondary | ICD-10-CM | POA: Diagnosis not present

## 2024-01-26 DIAGNOSIS — F411 Generalized anxiety disorder: Secondary | ICD-10-CM | POA: Diagnosis not present

## 2024-01-26 DIAGNOSIS — F331 Major depressive disorder, recurrent, moderate: Secondary | ICD-10-CM | POA: Diagnosis not present

## 2024-01-26 NOTE — Progress Notes (Signed)
 "  [facility] Counselor/Therapist Progress Note  Patient ID: Maureen [name], MRN: [id number],    Date: 01/26/2024  Time Spent:  54 minutes [id number]  Treatment Type: Individual Therapy  Risk Assessment: Danger to Self:  No Self-injurious Behavior: No Danger to Others: No  Subjective: This session was held via video teletherapy. The patient consented to video teletherapy and was located in her car during this session. She is aware it is the responsibility of the patient to secure confidentiality on her end of the session. The provider was in a private home office for the duration of this session.   The patient arrived late for her Caregility appointment appointment.  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 -still doing her monthly float 4-relationship -pt met Art online and has been dating  -after discussing that both are very invested he shared that his wife left him last March 2025 -he told her that he was picking up his wife -she called and wanted to come home and she is now living back in the home -pt has already identified that this relationship is not helpful for her and she does not want 5-setting boundaries in dating relationships -sets boundaries and follow through with children but has not done so with Art -plan for how to cut Art loose  Treatment Plan Problems Addressed  Anxiety, Depression Goals 1. Engage in healthy reciprocal dating relationship 2. Enhance ability to effectively cope with the full variety of life's worries and anxieties. Objective Complete a  Cost Benefit Analysis of maintaining the anxiety. Target Date: 2024-02-21 Frequency: Biweekly  Progress: 0 Modality: individual  Related Interventions Ask the client to evaluate the costs and benefits of worries (e.g., complete the Cost Benefit Analysis exercise in Ten Days to Self-Esteem! by Geofm) in which he/she lists the advantages and disadvantages of the negative thought, fear, or anxiety; process the completed assignment. Objective Describe situations, thoughts, feelings, and actions associated with anxieties and worries, their impact on functioning, and attempts to resolve them. Target Date: 2024-02-21 Frequency: Biweekly  Progress: 50 Modality: individual  Related Interventions Focus on developing a level of trust with the client; provide support and empathy to encourage the client to feel safe in expressing his/her GAD symptoms. Ask the client to describe his/her past experiences of anxiety and their impact on functioning; assess the focus, excessiveness, and uncontrollability of the worry and the type, frequency, intensity, and duration of his/her anxiety symptoms (consider using a structured interview such as The Anxiety Disorders Interview Schedule-Adult Version). Objective Identify the major life conflicts from the past and present that form the basis for present anxiety. Target Date: 2024-02-21 Frequency: Biweekly  Progress: 80 Modality: individual  Related Interventions Ask the client to develop and process a list of key past and present life conflicts that continue to cause worry. Reinforce the client's insights into the role of his/her past emotional pain and present anxiety. Assist the client in becoming aware of key unresolved life conflicts and in starting to work toward their resolution. Objective Actively evaluate dating relationships to ensure honest evaluation of healthy vs unheathy behaviors. Target Date: 2024-02-21  Frequency: Biweekly  Progress: 40 Modality:  individual  Related Interventions Assist pt in evaluating values-based dating relationships so that patient achieves her desires for a lifelong partner. 3. Identify and increase intrapersonal, interpersonal, and physical resources to foster positive coping strategies. 4. Learn and implement coping skills that result in a reduction of anxiety and worry, and improved daily functioning. 5. Resolve the core conflict that is the source of anxiety. Objective Maintain involvement in work, family, and social activities. Target Date: 2024-02-21 Frequency: Biweekly  Progress: 40 Modality: individual  Related Interventions Support the client in following through with work, family, and social activities rather than escaping or avoiding them to focus on anxiety.  Diagnosis:Major depressive disorder, recurrent episode, moderate (HCC)  Generalized anxiety disorder  PTSD (post-traumatic stress disorder)  Adult attention deficit disorder  Plan:  -work on list of questions she wants to know from prospective partner -meet again on Tuesday, February 13, 2024 at 1pm. "

## 2024-02-13 ENCOUNTER — Ambulatory Visit: Admitting: Professional

## 2024-02-13 ENCOUNTER — Encounter: Payer: Self-pay | Admitting: Professional

## 2024-02-13 DIAGNOSIS — F411 Generalized anxiety disorder: Secondary | ICD-10-CM | POA: Diagnosis not present

## 2024-02-13 DIAGNOSIS — F988 Other specified behavioral and emotional disorders with onset usually occurring in childhood and adolescence: Secondary | ICD-10-CM

## 2024-02-13 DIAGNOSIS — F331 Major depressive disorder, recurrent, moderate: Secondary | ICD-10-CM | POA: Diagnosis not present

## 2024-02-13 DIAGNOSIS — F4310 Post-traumatic stress disorder, unspecified: Secondary | ICD-10-CM

## 2024-02-13 NOTE — Progress Notes (Signed)
 "  [facility] Counselor/Therapist Progress Note  Patient ID: Maureen [name], MRN: [id number],    Date: 02/13/2024  Time Spent:  58 minutes [id number]  Treatment Type: Individual Therapy  Risk Assessment: Danger to Self:  No Self-injurious Behavior: No Danger to Others: No  Subjective: This session was held via video teletherapy. The patient consented to video teletherapy and was located in her car during this session. She is aware it is the responsibility of the patient to secure confidentiality on her end of the session. The provider was in a private home office for the duration of this session.   The patient arrived on time for her Caregility appointment appointment.  Issues addressed: 1-homework- pt did work on -work on list of questions she wants to know from prospective partner -pt wrote a list of things that she would want and list of non-negotiable's -pt shared what she wants and it was a healthy list of characteristics -her non-negotiable's include: no one with children under 18,  -pt has done so much better with moving someone out of her space if she needs to -she has been able to exclude people prior to even meeting -pt met someone on christian mingle- she met Lamar about ten days ago -they chatted on facebook for her to see if he is responsive, does he spell things correctly, they exchanges phone numbers and he texted her to tell her he would call tomorrow. -he is active in his church nad delivers communion to people who are not able to come to church -new had children, never married, he was father to two brother's children while they were in the the [employer] -good relationships with mom and now good with his dad -she had a conversation today and they are talking and chatting a lot -she asked him what he was looking for; he told her he could find casual anywhere and he was looking for someone that can be a companion and he said he was looking for a companion -they  have both agreed that they will continue to talk 2-relationships -pt said she was going to pause when someone asked her something and then make a commitment to call back -example: son asked her for her credit card to hold the airbnb and she stopped and said she would call him back tomorrow to let him know -she took the opportunity to consider her boundaries, her expectations, and she did not return call until next day -she outline $400 right now and the other $400 on Thursday -Joshua paid her back and scheduled -example 2: ex-husband Carl's mom called; her spouse is sickly and needs someone to sit with him; she has been paying her daughter for a half day and she gets paid -she asked if she was busy Martell was out of town) and pt stated what do you need; she needed someone to sit with papa on Sunday after church -she told her mom she would think on it and call her back tomorrow; she called her back and said she will not be able to help, she told her that Sundays were the day to take care of her, pt offered her alternative days and times she is available -her MIL said she can not fault her for taking care of herself -pt has gone through every room and if it's not my stuff it's gone, I have 3 or 4 rooms that are completely empty -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 and she is wonderful -they are working well together, a good connection, a good match -excellent flexibility -pantries and pages     -physical a-patient still have throbbing headaches 2-compromised MH from fall -flashbacks continues walking in the neighborhood -if she had a spouse she would no longer walk the dog -prior to headaches she had headaches (HA) less than 1/mo 3-self-care -I made this my year to take care of myself -she paid off her [facility] and it helped her breathe again -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 -still doing her  monthly float 4-relationship -pt met Art online and has been dating  -after discussing that both are very invested he shared that his wife left him last March 2025 -he told her that he was picking up his wife -she called and wanted to come home and she is now living back in the home -pt has already identified that this relationship is not helpful for her and she does not want 5-setting boundaries in dating relationships -sets boundaries and follow through with children but has not done so with Art -plan for how to cut Art loose  Treatment Plan Problems Addressed  Anxiety, Depression Goals 1. Engage in healthy reciprocal dating relationship 2. Enhance ability to effectively cope with the full variety of life's worries and anxieties. Objective Complete a Cost Benefit Analysis of maintaining the anxiety. Target Date: 2024-02-21 Frequency: Biweekly  Progress: 0 Modality: individual  Related Interventions Ask the client to evaluate the costs and benefits of worries (e.g., complete the Cost Benefit Analysis exercise in Ten Days to Self-Esteem! by [employer]of[employer]) in which he/she lists the advantages and disadvantages of the negative thought, fear, or anxiety; process the completed assignment. Objective Describe situations, thoughts, feelings, and actions associated with anxieties and worries, their impact on functioning, and attempts to resolve them. Target Date: 2024-02-21 Frequency: Biweekly  Progress: 50 Modality: individual  Related Interventions Focus on developing a level of trust with the client; provide support and empathy to encourage the client to feel safe in expressing his/her GAD symptoms. Ask the client to describe his/her past experiences of anxiety and their impact on functioning; assess the focus, excessiveness, and uncontrollability of the worry and the type, frequency, intensity, and duration of his/her anxiety symptoms (consider using a structured interview such as The Anxiety  Disorders Interview Schedule-Adult Version). Objective Identify the major life conflicts from the past and present that form the basis for present anxiety. Target Date: 2024-02-21 Frequency: Biweekly  Progress: 80 Modality: individual  Related Interventions Ask the client to develop and process a list of key past and present life conflicts that continue to cause worry. Reinforce the client's insights into the role of his/her past emotional pain and present anxiety. Assist the client in becoming aware of key unresolved life conflicts and in starting to work toward their resolution. Objective Actively evaluate dating relationships to ensure honest evaluation of healthy vs unheathy behaviors. Target Date: 2024-02-21 Frequency: Biweekly  Progress: 40 Modality: individual  Related Interventions Assist pt in evaluating values-based dating relationships so that patient achieves her desires for a lifelong partner. 3. Identify and increase intrapersonal, interpersonal, and physical resources to foster positive coping strategies. 4. Learn and implement coping skills that result in a reduction of anxiety and worry, and improved daily functioning. 5. Resolve the core conflict that is the source of anxiety. Objective Maintain involvement in work, family, and social activities. Target Date: 2024-02-21 Frequency: Biweekly  Progress: 40 Modality: individual  Related  Interventions Support the client in following through with work, family, and social activities rather than escaping or avoiding them to focus on anxiety.  Diagnosis:Major depressive disorder, recurrent episode, moderate (HCC)  Generalized anxiety disorder  PTSD (post-traumatic stress disorder)  Adult attention deficit disorder  Plan:  - -meet again on Tuesday, March 05, 2024 at 11am. "

## 2024-03-05 ENCOUNTER — Ambulatory Visit: Admitting: Professional
